# Patient Record
Sex: Female | Born: 1975 | Race: White | Hispanic: No | Marital: Married | Smoking: Never smoker
Health system: Southern US, Community
[De-identification: ages and names within clinical notes are randomized; demographics above are authoritative.]

## PROBLEM LIST (undated history)

## (undated) DIAGNOSIS — Z8619 Personal history of other infectious and parasitic diseases: Secondary | ICD-10-CM

## (undated) DIAGNOSIS — E669 Obesity, unspecified: Secondary | ICD-10-CM

## (undated) DIAGNOSIS — K589 Irritable bowel syndrome without diarrhea: Secondary | ICD-10-CM

## (undated) DIAGNOSIS — K219 Gastro-esophageal reflux disease without esophagitis: Secondary | ICD-10-CM

## (undated) DIAGNOSIS — G479 Sleep disorder, unspecified: Secondary | ICD-10-CM

## (undated) DIAGNOSIS — F329 Major depressive disorder, single episode, unspecified: Secondary | ICD-10-CM

## (undated) DIAGNOSIS — F32A Depression, unspecified: Secondary | ICD-10-CM

## (undated) DIAGNOSIS — G932 Benign intracranial hypertension: Secondary | ICD-10-CM

## (undated) DIAGNOSIS — R5383 Other fatigue: Secondary | ICD-10-CM

## (undated) DIAGNOSIS — M5136 Other intervertebral disc degeneration, lumbar region: Secondary | ICD-10-CM

## (undated) DIAGNOSIS — M51369 Other intervertebral disc degeneration, lumbar region without mention of lumbar back pain or lower extremity pain: Secondary | ICD-10-CM

## (undated) DIAGNOSIS — L659 Nonscarring hair loss, unspecified: Secondary | ICD-10-CM

## (undated) DIAGNOSIS — Z1589 Genetic susceptibility to other disease: Secondary | ICD-10-CM

## (undated) DIAGNOSIS — Z8742 Personal history of other diseases of the female genital tract: Secondary | ICD-10-CM

## (undated) DIAGNOSIS — E7212 Methylenetetrahydrofolate reductase deficiency: Secondary | ICD-10-CM

## (undated) DIAGNOSIS — Z9289 Personal history of other medical treatment: Secondary | ICD-10-CM

## (undated) HISTORY — DX: Other fatigue: R53.83

## (undated) HISTORY — DX: Nonscarring hair loss, unspecified: L65.9

## (undated) HISTORY — DX: Other intervertebral disc degeneration, lumbar region: M51.36

## (undated) HISTORY — DX: Personal history of other medical treatment: Z92.89

## (undated) HISTORY — DX: Genetic susceptibility to other disease: Z15.89

## (undated) HISTORY — DX: Gastro-esophageal reflux disease without esophagitis: K21.9

## (undated) HISTORY — DX: Depression, unspecified: F32.A

## (undated) HISTORY — DX: Irritable bowel syndrome, unspecified: K58.9

## (undated) HISTORY — DX: Other intervertebral disc degeneration, lumbar region without mention of lumbar back pain or lower extremity pain: M51.369

## (undated) HISTORY — DX: Personal history of other infectious and parasitic diseases: Z86.19

## (undated) HISTORY — DX: Major depressive disorder, single episode, unspecified: F32.9

## (undated) HISTORY — PX: TONSILLECTOMY: SUR1361

## (undated) HISTORY — DX: Methylenetetrahydrofolate reductase deficiency: E72.12

## (undated) HISTORY — DX: Obesity, unspecified: E66.9

## (undated) HISTORY — DX: Sleep disorder, unspecified: G47.9

## (undated) HISTORY — PX: NASAL SINUS SURGERY: SHX719

## (undated) HISTORY — DX: Personal history of other diseases of the female genital tract: Z87.42

## (undated) HISTORY — DX: Benign intracranial hypertension: G93.2

---
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
## 2006-02-26 ENCOUNTER — Emergency Department: Payer: Self-pay | Admitting: Emergency Medicine

## 2007-04-05 DIAGNOSIS — Z8742 Personal history of other diseases of the female genital tract: Secondary | ICD-10-CM | POA: Insufficient documentation

## 2007-04-05 HISTORY — DX: Personal history of other diseases of the female genital tract: Z87.42

## 2007-09-03 HISTORY — PX: OTHER SURGICAL HISTORY: SHX169

## 2008-03-21 ENCOUNTER — Encounter: Admission: RE | Admit: 2008-03-21 | Discharge: 2008-03-21 | Payer: Self-pay | Admitting: Family Medicine

## 2009-01-02 ENCOUNTER — Encounter: Payer: Self-pay | Admitting: Cardiology

## 2009-01-02 DIAGNOSIS — Z9289 Personal history of other medical treatment: Secondary | ICD-10-CM

## 2009-01-02 HISTORY — PX: OTHER SURGICAL HISTORY: SHX169

## 2009-01-02 HISTORY — PX: TRANSTHORACIC ECHOCARDIOGRAM: SHX275

## 2009-01-02 HISTORY — DX: Personal history of other medical treatment: Z92.89

## 2009-01-06 ENCOUNTER — Encounter: Payer: Self-pay | Admitting: Cardiology

## 2009-01-07 ENCOUNTER — Encounter: Payer: Self-pay | Admitting: Cardiology

## 2009-01-15 ENCOUNTER — Ambulatory Visit: Payer: Self-pay | Admitting: Cardiology

## 2009-01-15 DIAGNOSIS — J45909 Unspecified asthma, uncomplicated: Secondary | ICD-10-CM | POA: Insufficient documentation

## 2009-01-15 DIAGNOSIS — R079 Chest pain, unspecified: Secondary | ICD-10-CM | POA: Insufficient documentation

## 2009-01-15 LAB — CONVERTED CEMR LAB
Eosinophils Relative: 0 %
Monocytes Relative: 0.8 %
Neutrophils Relative %: 6.9 %
T3 Total by RIA: 21 nmol/L

## 2009-01-27 ENCOUNTER — Ambulatory Visit: Payer: Self-pay

## 2009-01-27 ENCOUNTER — Encounter: Payer: Self-pay | Admitting: Cardiology

## 2009-01-30 ENCOUNTER — Ambulatory Visit: Payer: Self-pay | Admitting: Cardiology

## 2010-10-14 ENCOUNTER — Emergency Department (HOSPITAL_COMMUNITY): Payer: 59

## 2010-10-14 ENCOUNTER — Emergency Department (HOSPITAL_COMMUNITY)
Admission: EM | Admit: 2010-10-14 | Discharge: 2010-10-14 | Disposition: A | Discharge status: Home / Self Care | Payer: 59 | Attending: Emergency Medicine | Admitting: Emergency Medicine

## 2010-10-14 ENCOUNTER — Encounter (HOSPITAL_COMMUNITY): Payer: Self-pay | Admitting: Radiology

## 2010-10-14 DIAGNOSIS — J45909 Unspecified asthma, uncomplicated: Secondary | ICD-10-CM | POA: Insufficient documentation

## 2010-10-14 DIAGNOSIS — R0789 Other chest pain: Secondary | ICD-10-CM | POA: Insufficient documentation

## 2010-10-14 DIAGNOSIS — K219 Gastro-esophageal reflux disease without esophagitis: Secondary | ICD-10-CM | POA: Insufficient documentation

## 2010-10-14 LAB — CBC
HCT: 41.2 % (ref 36.0–46.0)
Hemoglobin: 14.5 g/dL (ref 12.0–15.0)
MCH: 29.5 pg (ref 26.0–34.0)
MCHC: 35.2 g/dL (ref 30.0–36.0)
MCV: 83.7 fL (ref 78.0–100.0)
RBC: 4.92 MIL/uL (ref 3.87–5.11)

## 2010-10-14 LAB — DIFFERENTIAL
Basophils Absolute: 0 10*3/uL (ref 0.0–0.1)
Basophils Relative: 0 % (ref 0–1)
Eosinophils Absolute: 0 10*3/uL (ref 0.0–0.7)
Eosinophils Relative: 0 % (ref 0–5)
Lymphocytes Relative: 30 % (ref 12–46)
Lymphs Abs: 3.3 10*3/uL (ref 0.7–4.0)
Monocytes Absolute: 0.8 10*3/uL (ref 0.1–1.0)
Monocytes Relative: 7 % (ref 3–12)
Neutro Abs: 6.7 10*3/uL (ref 1.7–7.7)
Neutrophils Relative %: 62 % (ref 43–77)

## 2010-10-14 LAB — POCT I-STAT, CHEM 8
BUN: 15 mg/dL (ref 6–23)
Creatinine, Ser: 0.8 mg/dL (ref 0.50–1.10)
Glucose, Bld: 98 mg/dL (ref 70–99)
Hemoglobin: 15 g/dL (ref 12.0–15.0)
TCO2: 23 mmol/L (ref 0–100)

## 2010-10-14 LAB — D-DIMER, QUANTITATIVE: D-Dimer, Quant: 0.57 ug/mL-FEU — ABNORMAL HIGH (ref 0.00–0.48)

## 2010-10-14 MED ORDER — IOHEXOL 350 MG/ML SOLN
100.0000 mL | Freq: Once | INTRAVENOUS | Status: AC | PRN
  Administered 2010-10-14: 100 mL via INTRAVENOUS

## 2010-10-20 ENCOUNTER — Encounter: Payer: Self-pay | Admitting: *Deleted

## 2010-10-20 ENCOUNTER — Encounter: Payer: Self-pay | Admitting: Cardiovascular Disease

## 2010-10-20 ENCOUNTER — Ambulatory Visit (INDEPENDENT_AMBULATORY_CARE_PROVIDER_SITE_OTHER): Payer: 59 | Admitting: Cardiovascular Disease

## 2010-10-20 VITALS — BP 135/86 | HR 82 | Ht 66.0 in | Wt 272.0 lb

## 2010-10-20 DIAGNOSIS — R079 Chest pain, unspecified: Secondary | ICD-10-CM

## 2010-10-20 NOTE — Progress Notes (Signed)
Belinda Martin Date of Birth  June 01, 1975 [facility] / [facility] [address]     [address] [place], [place]  [address] [phone]  Fax  [phone]  History of Present Illness:  Episodes of CP and dyspnea for the past year.  Also has has had neck pain.  Also has tingling in arms.  This past Thursday, she had cp and dyspnea.  Work up at [employer] cone was normal. D dimer was slightly elevated.  CTA of chest was normal.  She is very anxious about the possibility of having coronary artery disease.  Current Outpatient Prescriptions  Medication Sig Dispense Refill  . albuterol (PROVENTIL) (2.5 MG/3ML) 0.083% nebulizer solution Take 2.5 mg by nebulization every 6 (six) hours as needed.        . cetirizine (ZYRTEC) 10 MG tablet Take 10 mg by mouth daily.        . Fluticasone-Salmeterol (ADVAIR DISKUS) 100-50 MCG/DOSE AEPB Inhale 1 puff into the lungs every 12 (twelve) hours.        [name] omeprazole (PRILOSEC) 20 MG capsule Take 20 mg by mouth daily.           Allergies  Allergen Reactions  . Amoxicillin     REACTION: hives    Past Medical History  Diagnosis Date  . Asthma   . GERD (gastroesophageal reflux disease)     Past Surgical History  Procedure Date  . Tonsillectomy and adenoidectomy     History  Smoking status  . Never Smoker   Smokeless tobacco  . Not on file    History  Alcohol Use  . Yes    Family History  Problem Relation Age of Onset  . Arrhythmia Mother   . Arrhythmia Father   . Heart failure Paternal Grandfather     Reviw of Systems:  Reviewed in the HPI.  All other systems are negative.  Physical Exam: BP 135/86  Pulse 82  Ht 5\' 6"  (1.676 m)  Wt 272 lb (123.378 kg)  BMI 43.90 kg/m2.  She is very obese. The patient is alert and oriented x 3.  The mood and affect are normal.   Skin: warm and dry.  Color is normal.    HEENT:   the sclera are nonicteric.  The mucous membranes are moist.  The carotids are 2+ without  bruits.  There is no thyromegaly.  There is no JVD.    Lungs: clear.  The chest wall is non tender.    Heart: regular rate with a normal S1 and S2.  There are no murmurs, gallops, or rubs. The PMI is not displaced.     Abdomin: good bowel sounds.  There is no guarding or rebound.  There is no hepatosplenomegaly or tenderness.  There are no masses.   Extremities:  no clubbing, cyanosis, or edema.  The legs are without rashes.  The distal pulses are intact.   Neuro:  Cranial nerves II - XII are intact.  Motor and sensory functions are intact.    The gait is normal.  ECG: NSR. No ST or T wave change  Assessment / Plan:

## 2010-10-20 NOTE — Patient Instructions (Signed)
You are scheduled for a 2 day myoview, please refer to instruction letter given in office today.  Your physician recommends that you schedule a follow-up appointment in: August 2012 with Dr. Elease Hashimoto:

## 2010-10-20 NOTE — Assessment & Plan Note (Addendum)
She presents with episodes of chest discomfort. She has a history of obesity. She has a family history of coronary artery disease. We'll schedule her for a 2 day stress Myoview study for further evaluation.  Her  d-dimer was only mildly elevated. A followup CT angiogram was negative for pulmonary embolus. I do not think that there is high likelihood of her having a pulmonary embolus.  I have advised her to lose weight. I think this will be the best way for her to avoid future problems.  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  I discussed this disorder with Dr. Marlena Clipper. He stated that this clotting abnormality was previously thought to be responsible for many piercings including premature fetal death, coronary artery disease, hypercoagulability. He did state that at some point we may want to order a homocystine level.  We can treat her with Foltx if her homocystine is found to be elevated. 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  I discussed this back with the patient and  hopefully she is reassured.  I will her again in several months.

## 2010-10-21 ENCOUNTER — Encounter: Payer: Self-pay | Admitting: Cardiology

## 2010-10-26 ENCOUNTER — Encounter: Payer: Self-pay | Admitting: *Deleted

## 2010-10-28 ENCOUNTER — Encounter (HOSPITAL_COMMUNITY): Payer: 59 | Admitting: Radiology

## 2010-11-01 ENCOUNTER — Ambulatory Visit (HOSPITAL_COMMUNITY): Payer: 59 | Attending: Cardiovascular Disease | Admitting: Radiology

## 2010-11-01 VITALS — Ht 66.0 in | Wt 270.0 lb

## 2010-11-01 DIAGNOSIS — R0789 Other chest pain: Secondary | ICD-10-CM

## 2010-11-01 DIAGNOSIS — R079 Chest pain, unspecified: Secondary | ICD-10-CM

## 2010-11-01 DIAGNOSIS — R0602 Shortness of breath: Secondary | ICD-10-CM

## 2010-11-01 MED ORDER — TECHNETIUM TC 99M TETROFOSMIN IV KIT
33.0000 | PACK | Freq: Once | INTRAVENOUS | Status: AC | PRN
  Administered 2010-11-01: 33 via INTRAVENOUS

## 2010-11-01 NOTE — Progress Notes (Addendum)
[facility] SITE 3 NUCLEAR MED [address] [place] [address] [phone]  Cardiology Nuclear Med Study  Nidhi Jacome is a 35 y.o. female [id number] 1975-05-28   Nuclear Med Background Indication for Stress Test:  Evaluation for Ischemia and Post Hospital: 10/14/10 CP, Bilateral Neck and (R) arm tightness tightness and SOB History: 10/10 Echo: EF 65-70% and Asthma Cardiac Risk Factors: Family History - CAD  Symptoms:  Chest Pain, Chest Tightness, Neck tightness, (R) arm tightness  and SOB   Nuclear Pre-Procedure Caffeine/Decaff Intake:  None NPO After: 7:30pm   Lungs:  clear IV 0.9% NS with Angio Cath:  20g  IV Site: R Antecubital  IV Started by:  Milana Na, EMT-P  Chest Size (in):  42 Cup Size: DDD  Height: 5\' 6"  (1.676 m)  Weight:  270 lb (122.471 kg)  BMI:  Body mass index is 43.58 kg/(m^2). Tech Comments:  n/a    Nuclear Med Study 1 or 2 day study: 2 day  Stress Test Type:  Stress  Reading MD: Willa Rough, MD  Order Authorizing Provider:  P.Nahser  Resting Radionuclide: Technetium 10m Tetrofosmin  Resting Radionuclide Dose: 33.0 mCi   Stress Radionuclide:  Technetium 63m Tetrofosmin  Stress Radionuclide Dose: 33.0 mCi           Stress Protocol Rest HR: 80 Stress HR: 166  Rest BP: 112/66 Stress BP: 131/60  Exercise Time (min): 6:30 METS: 7.70   Predicted Max HR: 188 bpm % Max HR: 88.3 bpm Rate Pressure Product: 21308   Dose of Adenosine (mg):  n/a Dose of Lexiscan: n/a mg  Dose of Atropine (mg): n/a Dose of Dobutamine: n/a mcg/kg/min (at max HR)  Stress Test Technologist: Milana Na, EMT-P  Nuclear Technologist:  Harlow Asa, CNMT     Rest Procedure:  Myocardial perfusion imaging was performed at rest 45 minutes following the intravenous administration of Technetium 4m Tetrofosmin. Rest ECG: NSR  Stress Procedure:  The patient exercised for 6:30.  The patient stopped due to fatigue and denied any chest pain.  There  were no significant ST-T wave changes.  (R) Neck and (R) arm tightness 4/10 through recovery but pain free after her pictures. Technetium 43m Tetrofosmin was injected at peak exercise and myocardial perfusion imaging was performed after a brief delay. Stress ECG: No significant change from baseline ECG  QPS Raw Data Images:  There is a breast shadow that accounts for the anterior attenuation. Stress Images:  There is decreased uptake in the anterior wall. Rest Images:  There is decreased uptake in the anterior wall. Subtraction (SDS):  There is a fixed anteriour defect that is most consistent with breast attenuation. Transient Ischemic Dilatation (Normal <1.22):  1.01 Lung/Heart Ratio (Normal <0.45):  0.20  Quantitative Gated Spect Images QGS EDV:  80 ml QGS ESV:  19 ml QGS cine images:  NL LV Function; NL Wall Motion QGS EF: 76%  Impression Exercise Capacity:  Fair exercise capacity. BP Response:  Normal blood pressure response. Clinical Symptoms:  Neck and arm tightness. ECG Impression:  No significant ST segment change suggestive of ischemia. Comparison with Prior Nuclear Study: No images to compare  Overall Impression:  Normal stress nuclear study.   Olga Millers     Please report normal study.  Vesta Mixer, Montez Hageman., MD, [facility]

## 2010-11-04 ENCOUNTER — Encounter: Payer: Self-pay | Admitting: Cardiovascular Disease

## 2010-11-04 ENCOUNTER — Ambulatory Visit (HOSPITAL_COMMUNITY): Payer: 59 | Attending: Cardiovascular Disease | Admitting: Radiology

## 2010-11-04 DIAGNOSIS — R0989 Other specified symptoms and signs involving the circulatory and respiratory systems: Secondary | ICD-10-CM

## 2010-11-04 MED ORDER — TECHNETIUM TC 99M TETROFOSMIN IV KIT
33.0000 | PACK | Freq: Once | INTRAVENOUS | Status: AC | PRN
  Administered 2010-11-04: 33 via INTRAVENOUS

## 2010-11-05 ENCOUNTER — Telehealth: Payer: Self-pay | Admitting: *Deleted

## 2010-11-05 NOTE — Telephone Encounter (Signed)
Patient called with nuc results. msg left Alfonso Ramus RN

## 2010-11-05 NOTE — Progress Notes (Signed)
Nuclear report routed to Dr Nahser. Belinda Martin  

## 2010-11-18 ENCOUNTER — Other Ambulatory Visit: Payer: Self-pay | Admitting: Cardiology

## 2010-11-18 DIAGNOSIS — R209 Unspecified disturbances of skin sensation: Secondary | ICD-10-CM

## 2010-11-22 ENCOUNTER — Encounter (INDEPENDENT_AMBULATORY_CARE_PROVIDER_SITE_OTHER): Payer: 59 | Admitting: Cardiology

## 2010-11-22 DIAGNOSIS — R209 Unspecified disturbances of skin sensation: Secondary | ICD-10-CM

## 2010-11-23 ENCOUNTER — Ambulatory Visit (INDEPENDENT_AMBULATORY_CARE_PROVIDER_SITE_OTHER): Payer: 59 | Admitting: Cardiovascular Disease

## 2010-11-23 ENCOUNTER — Encounter: Payer: Self-pay | Admitting: Family Medicine

## 2010-11-23 ENCOUNTER — Encounter: Payer: Self-pay | Admitting: Cardiovascular Disease

## 2010-11-23 DIAGNOSIS — E785 Hyperlipidemia, unspecified: Secondary | ICD-10-CM | POA: Insufficient documentation

## 2010-11-23 DIAGNOSIS — R079 Chest pain, unspecified: Secondary | ICD-10-CM

## 2010-11-23 NOTE — Assessment & Plan Note (Signed)
Her cholesterol levels have been mildly elevated. She's trying diet and exercise before she starts any cholesterol medicines. She will followup with Dr. Modesto Charon for this.

## 2010-11-23 NOTE — Assessment & Plan Note (Signed)
Skin is no longer having any further episodes of chest pain. Her Lexis can Myoview study was normal. She had no evidence of ischemia. She has normal left ventricular systolic function.  She's exercising on a regular basis. She's not having any discomfort when she exercises. He's also mowing her mother's lawn without any pains.  I do not that she has any significant coronary disease. He informed that she does have some high cholesterol levels which is being followed by Dr. Leodis Sias.

## 2010-11-23 NOTE — Progress Notes (Addendum)
Belinda Martin Date of Birth  1976/03/28 [facility] / [facility] [address]     [address] [place], [place]  [address] [phone]  Fax  [phone]  History of Present Illness:  Belinda Martin is a 35 year old female who I saw a month ago. She has a history of chest pain. A lengthy scan at [facility] study was normal. He is now exercising on an intermittent basis. She's been mowing  her mother's lawn on a regular basis. She's lost 2 pounds since her last office visit. She thinks that her exercise regimen may be getting a little bit easier for her to do.   Current Outpatient Prescriptions on File Prior to Visit  Medication Sig Dispense Refill  . albuterol (PROVENTIL) (2.5 MG/3ML) 0.083% nebulizer solution Take 2.5 mg by nebulization every 6 (six) hours as needed.        . cetirizine (ZYRTEC) 10 MG tablet Take 10 mg by mouth daily.        [name] dextromethorphan (DELSYM) 30 MG/5ML liquid Take 60 mg by mouth as needed.        . dicyclomine (BENTYL) 20 MG tablet Take 20 mg by mouth 4 (four) times daily as needed.        . fluticasone (VERAMYST) 27.5 MCG/SPRAY nasal spray Place into the nose.        [name] Fluticasone-Salmeterol (ADVAIR DISKUS) 100-50 MCG/DOSE AEPB Inhale 1 puff into the lungs every 12 (twelve) hours.        . meloxicam (MOBIC) 15 MG tablet Take 15 mg by mouth daily.        [name] omeprazole (PRILOSEC) 20 MG capsule Take 20 mg by mouth daily.          Allergies  Allergen Reactions  . Amoxicillin     REACTION: hives    Past Medical History  Diagnosis Date  . GERD (gastroesophageal reflux disease)   . Fatigue   . Chest pain   . Hair loss     excessive  . Obesity   . Asthma     Allergic  . History of echocardiogram 10/10    EF 65-70%, normal valves    Past Surgical History  Procedure Date  . Tonsillectomy and adenoidectomy   . Nasal sinus surgery   . Echo test 10/10    EF 65-70%, normal valves    History  Smoking status  . Never Smoker   Smokeless  tobacco  . Not on file    History  Alcohol Use No    Family History  Problem Relation Age of Onset  . Arrhythmia Mother   . Heart murmur Mother   . Arrhythmia Father   . Heart failure Paternal Grandfather     CHF  . Heart murmur Paternal Uncle   . Coronary artery disease Neg Hx     premature CAD    Reviw of Systems:  Reviewed in the HPI.  All other systems are negative.  Physical Exam: BP 135/82  Pulse 83  Ht 5\' 7"  (1.702 m)  Wt 270 lb (122.471 kg)  BMI 42.29 kg/m2 The patient is alert and oriented x 3.  The mood and affect are normal.   Skin: warm and dry.  Color is normal.    HEENT:   the sclera are nonicteric.  The mucous membranes are moist.  The carotids are 2+ without bruits.  There is no thyromegaly.  There is no JVD.    Lungs: clear.  The chest wall is non tender.  Heart: regular rate with a normal S1 and S2.  There are no murmurs, gallops, or rubs. The PMI is not displaced.     Abdomen: good bowel sounds.  There is no guarding or rebound.  There is no hepatosplenomegaly or tenderness.  There are no masses.   Extremities:  no clubbing, cyanosis, or edema.  The legs are without rashes.  The distal pulses are intact.   Neuro:  Cranial nerves II - XII are intact.  Motor and sensory functions are intact.    The gait is normal.  ECG:   Normal sinus rhythm. Chest normal EKG.  Assessment / Plan:

## 2010-11-24 ENCOUNTER — Encounter: Payer: Self-pay | Admitting: Cardiovascular Disease

## 2010-11-25 ENCOUNTER — Telehealth: Payer: Self-pay | Admitting: *Deleted

## 2010-11-25 NOTE — Telephone Encounter (Signed)
Message copied by Antony Odea on Thu Nov 25, 2010  3:13 PM ------      Message from: [place], [place]      Created: Wed Nov 24, 2010  6:17 PM       Normal carotid artery duplex

## 2010-11-25 NOTE — Telephone Encounter (Signed)
Patient called with normal carotid results. Alfonso Ramus RN

## 2011-12-08 ENCOUNTER — Encounter: Payer: Self-pay | Admitting: Obstetrics and Gynecology

## 2012-07-20 ENCOUNTER — Ambulatory Visit: Payer: Self-pay | Admitting: Family Medicine

## 2012-07-24 ENCOUNTER — Other Ambulatory Visit: Payer: Self-pay | Admitting: Orthopedic Surgery

## 2012-07-24 DIAGNOSIS — M5136 Other intervertebral disc degeneration, lumbar region: Secondary | ICD-10-CM

## 2012-07-28 ENCOUNTER — Ambulatory Visit
Admission: RE | Admit: 2012-07-28 | Discharge: 2012-07-28 | Disposition: A | Discharge status: Home / Self Care | Payer: 59 | Source: Ambulatory Visit | Attending: Orthopedic Surgery | Admitting: Orthopedic Surgery

## 2012-07-28 DIAGNOSIS — M5136 Other intervertebral disc degeneration, lumbar region: Secondary | ICD-10-CM

## 2013-02-01 ENCOUNTER — Ambulatory Visit: Payer: Self-pay | Admitting: Family Medicine

## 2013-02-08 ENCOUNTER — Ambulatory Visit (INDEPENDENT_AMBULATORY_CARE_PROVIDER_SITE_OTHER): Payer: 59 | Admitting: Family Medicine

## 2013-02-08 ENCOUNTER — Ambulatory Visit: Payer: Self-pay | Admitting: Family Medicine

## 2013-02-08 ENCOUNTER — Encounter: Payer: Self-pay | Admitting: Family Medicine

## 2013-02-08 VITALS — BP 120/72 | HR 98 | Temp 98.3°F | Resp 18 | Ht 66.5 in | Wt 277.0 lb

## 2013-02-08 DIAGNOSIS — R5381 Other malaise: Secondary | ICD-10-CM

## 2013-02-08 DIAGNOSIS — R5383 Other fatigue: Secondary | ICD-10-CM

## 2013-02-08 DIAGNOSIS — G2581 Restless legs syndrome: Secondary | ICD-10-CM

## 2013-02-08 MED ORDER — ROPINIROLE HCL 0.5 MG PO TABS: 1.5000 mg | ORAL_TABLET | Freq: Three times a day (TID) | ORAL | Status: DC

## 2013-02-08 NOTE — Progress Notes (Signed)
  Subjective:    Patient ID: Belinda Martin, female    DOB: [birth year], 37 y.o.   MRN: [id number]  HPI Patient presents complaining of severe malaise and fatigue. She states she can sleep 12 hours and still not feel rested. She is always tired. She needs and thoughts of driving to work. She had a sleep study which showed an apnea hypotony index less than 5. Although it did show frequent periodic limb movements of sleep. Her husband frequently says that she kicks him during the middle of the night. She also reports increased thirst and urinary frequency. She also reports memory problems and poor concentration although she attributes this to her poor sleep. She denies any depression or anhedonia. She denies any weight loss consistent with diabetes. Past Medical History  Diagnosis Date  . GERD (gastroesophageal reflux disease)   . Fatigue   . Chest pain   . Hair loss     excessive  . Obesity   . Asthma     Allergic  . History of echocardiogram 10/10    EF 65-70%, normal valves   No current outpatient prescriptions on file prior to visit.   No current facility-administered medications on file prior to visit.   Allergies  Allergen Reactions  . Amoxicillin     REACTION: hives   History   Social History  . Marital Status: Married    Spouse Name: N/A    Number of Children: N/A  . Years of Education: N/A   Occupational History  . Mental health assessments     [employer]   Social History Main Topics  . Smoking status: Never Smoker   . Smokeless tobacco: Not on file  . Alcohol Use: No  . Drug Use: No  . Sexual Activity: Not on file   Other Topics Concern  . 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       Review of Systems  All other systems reviewed and are negative.       Objective:   Physical Exam  Vitals reviewed. Neck: Neck supple. No thyromegaly present.   Cardiovascular: Normal rate and regular rhythm.   Pulmonary/Chest: Effort normal and breath sounds normal.  Psychiatric: She has a normal mood and affect. Her behavior is normal. Judgment and thought content normal.          Assessment & Plan:  1. Other malaise and fatigue I have given the patient an order for her to go to [facility].  I have requested that she check a CBC, CMP, TSH, B12 level to rule out metabolic causes of fatigue and rule out diabetes. I do not she has depression. 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  2. RLS (restless legs syndrome) Pending the results of laboratory studies, I recommended she start Requip 0.5 mg tablets. She is to take one tablet at night for 2 weeks then increase to 2 tablets at night for 2 weeks then increase to 3 tablets at night if necessary to control limb movements. It reassess her fatigue after she's been on the medicine for one month.

## 2013-02-24 ENCOUNTER — Encounter: Payer: Self-pay | Admitting: Family Medicine

## 2013-04-10 ENCOUNTER — Encounter: Payer: Self-pay | Admitting: Family Medicine

## 2013-07-05 ENCOUNTER — Ambulatory Visit (INDEPENDENT_AMBULATORY_CARE_PROVIDER_SITE_OTHER): Payer: 59 | Admitting: Family Medicine

## 2013-07-05 DIAGNOSIS — Z23 Encounter for immunization: Secondary | ICD-10-CM

## 2013-07-17 ENCOUNTER — Ambulatory Visit (INDEPENDENT_AMBULATORY_CARE_PROVIDER_SITE_OTHER): Payer: 59 | Admitting: Family Medicine

## 2013-07-17 ENCOUNTER — Encounter: Payer: Self-pay | Admitting: Family Medicine

## 2013-07-17 VITALS — BP 122/68 | HR 76 | Temp 98.2°F | Resp 14 | Ht 65.0 in | Wt 269.0 lb

## 2013-07-17 DIAGNOSIS — G932 Benign intracranial hypertension: Secondary | ICD-10-CM

## 2013-07-17 DIAGNOSIS — L0291 Cutaneous abscess, unspecified: Secondary | ICD-10-CM

## 2013-07-17 DIAGNOSIS — J3089 Other allergic rhinitis: Secondary | ICD-10-CM

## 2013-07-17 DIAGNOSIS — L0390 Cellulitis, unspecified: Secondary | ICD-10-CM | POA: Insufficient documentation

## 2013-07-17 MED ORDER — CEFDINIR 300 MG PO CAPS: 300.0000 mg | ORAL_CAPSULE | Freq: Two times a day (BID) | ORAL | Status: DC

## 2013-07-17 NOTE — Assessment & Plan Note (Signed)
No signs and symptoms of Psuedotumor Cerebri, obtain opthalmology records and see if pt had any papilledema, consider MRI based on results

## 2013-07-17 NOTE — Assessment & Plan Note (Signed)
Her pics look more like a cellultis, she has some tenderness, over the masotid, region, but I do not think this is a true mastoiditis No erythema actually seen on exam currently, no OM, or OE Will start her on omnicef to cover, given red flags

## 2013-07-17 NOTE — Progress Notes (Signed)
Patient ID: Belinda Martin, female   DOB: [birth year], 38 y.o.   MRN: [id number]   Subjective:    Patient ID: Belinda Martin, female    DOB: 06/17/1975, 38 y.o.   MRN: [id number]  Patient presents for Infection and new dx from eye doctor  Pt here with 24 hours of redness warmth, behing right ear, also with tenderness near mastoid region, no fever, no ear drainage, no ear pain, no sore throat or sinus symptoms. She took a picture of her ear last night which had erythema extending from pinna of right ear to posterior auricular region  Also states her eye doctor did an exam on her and told her she has pseudotumor and that she needed to loose weight. No headaches, no vision changes.    Review Of Systems: - per above  GEN- denies fatigue, fever, weight loss,weakness, recent illness HEENT- denies eye drainage, change in vision, nasal discharge, CVS- denies chest pain, palpitations RESP- denies SOB, cough, wheeze Neuro- denies headache, dizziness, syncope, seizure activity       Objective:    BP 122/68  Pulse 76  Temp(Src) 98.2 F (36.8 C) (Oral)  Resp 14  Ht 5\' 5"  (1.651 m)  Wt 269 lb (122.018 kg)  BMI 44.76 kg/m2  LMP 06/19/2013 GEN- NAD, alert and oriented x3 HEENT- PERRL, EOMI, non injected sclera, pink conjunctiva, MMM, oropharynx clear, TM Clear no effusion, canals clear bilat, no maxillary sinus tenderness Neck- Supple, shotty LAD submandibular Skin- minimal erythema post auricular, mild warmth noted, no swelling of earlobe or mastoid region, mild TTP over mastoid region and tracking posterior auricaular region Neuro- CNII-XII in tact, no deficits       Assessment & Plan:      Problem List Items Addressed This Visit   Pseudotumor cerebri     No signs and symptoms of Psuedotumor Cerebri, obtain opthalmology records and see if pt had any papilledema, consider MRI based on results    Cellulitis - Primary     Her pics look more like a cellultis, she has some tenderness, over the  masotid, region, but I do not think this is a true mastoiditis No erythema actually seen on exam currently, no OM, or OE Will start her on omnicef to cover, given red flags       Note: This dictation was prepared with Dragon dictation along with smaller phrase technology. Any transcriptional errors that result from this process are unintentional.

## 2013-07-17 NOTE — Patient Instructions (Addendum)
Release on [place], [place]  Start antibiotics to cover for infection Continue Ibuprofen 600mg  every 6 hours as needed with food Call if it looks worse or go to nearest ER Referral to allergy specialist in [place] F/U as needed

## 2013-07-18 ENCOUNTER — Ambulatory Visit: Payer: 59 | Admitting: Physician Assistant

## 2013-07-19 ENCOUNTER — Telehealth: Payer: Self-pay | Admitting: *Deleted

## 2013-07-19 NOTE — Telephone Encounter (Signed)
Message copied by Sheral Flow on Fri Jul 19, 2013  8:54 AM ------      Message from: Lenore Manner      Created: Fri Jul 19, 2013  8:38 AM      Regarding: Eye issues      Contact: [phone]       PT is calling because yesterday she went to work out and after she got back to house. Her left eye wasn't right it was like she could see an edge of a prism for brief moments, and had a headache in front of her head and sides.  ------

## 2013-07-19 NOTE — Telephone Encounter (Signed)
Call placed to patient and patient made aware.  

## 2013-07-19 NOTE — Telephone Encounter (Signed)
Call placed to patient to obtain more information.  LMTRC.  

## 2013-07-19 NOTE — Telephone Encounter (Signed)
Message copied by Sheral Flow on Fri Jul 19, 2013  8:53 AM ------      Message from: Lenore Manner      Created: Fri Jul 19, 2013  8:38 AM      Regarding: Eye issues      Contact: [phone]       PT is calling because yesterday she went to work out and after she got back to house. Her left eye wasn't right it was like she could see an edge of a prism for brief moments, and had a headache in front of her head and sides.  ------

## 2013-07-19 NOTE — Telephone Encounter (Signed)
Noted She can continue the current medications Tyelnol or ibuprofen for headache for now

## 2013-07-19 NOTE — Telephone Encounter (Signed)
Patient returned call.   States that she saw prism in L eye and then was followed by headache in temple.   States that today she has tenderness noted to ear, and her headache is not completely resolved. She is continuing the ABT for cellulitis to L ear and is taking Ibuprofen for pain.   MD to be made aware.

## 2013-07-21 ENCOUNTER — Telehealth: Payer: Self-pay | Admitting: Family Medicine

## 2013-07-21 DIAGNOSIS — G932 Benign intracranial hypertension: Secondary | ICD-10-CM

## 2013-07-21 DIAGNOSIS — R519 Headache, unspecified: Secondary | ICD-10-CM

## 2013-07-21 DIAGNOSIS — H4710 Unspecified papilledema: Secondary | ICD-10-CM

## 2013-07-21 DIAGNOSIS — R51 Headache: Principal | ICD-10-CM

## 2013-07-21 DIAGNOSIS — J3089 Other allergic rhinitis: Secondary | ICD-10-CM | POA: Insufficient documentation

## 2013-07-21 NOTE — Telephone Encounter (Signed)
Please call pt, I saw eye doctor appt, there was a question of her optic nerve being swollen- papilledema, this is were the psudotumor cerebri or Intracranial HTN came in, but it was not definite. Since she has had some headaches recently we will go ahead and get the MRI, due to the information from the eye doctor.

## 2013-07-21 NOTE — Addendum Note (Signed)
Addended by: Vic Blackbird F on: 07/21/2013 09:34 AM   Modules accepted: Orders

## 2013-07-22 NOTE — Telephone Encounter (Signed)
LMTRC

## 2013-07-23 NOTE — Telephone Encounter (Signed)
Call placed to patient and patient made aware per VM.  

## 2013-07-23 NOTE — Telephone Encounter (Signed)
[facility].    MRI scheduled for 07/29/2013.

## 2013-07-26 ENCOUNTER — Ambulatory Visit
Admission: RE | Admit: 2013-07-26 | Discharge: 2013-07-26 | Disposition: A | Discharge status: Home / Self Care | Payer: No Typology Code available for payment source | Source: Ambulatory Visit | Attending: Family Medicine | Admitting: Family Medicine

## 2013-07-26 DIAGNOSIS — R519 Headache, unspecified: Secondary | ICD-10-CM

## 2013-07-26 DIAGNOSIS — G932 Benign intracranial hypertension: Secondary | ICD-10-CM

## 2013-07-26 DIAGNOSIS — R51 Headache: Principal | ICD-10-CM

## 2013-07-26 DIAGNOSIS — H4710 Unspecified papilledema: Secondary | ICD-10-CM

## 2013-07-29 ENCOUNTER — Other Ambulatory Visit: Payer: 59

## 2013-08-23 ENCOUNTER — Telehealth: Payer: Self-pay | Admitting: Family Medicine

## 2013-08-23 MED ORDER — ROPINIROLE HCL 0.5 MG PO TABS: 1.5000 mg | ORAL_TABLET | Freq: Three times a day (TID) | ORAL | Status: DC

## 2013-08-23 NOTE — Telephone Encounter (Signed)
Rx Refilled and pt aware no need for appt at this time

## 2013-08-23 NOTE — Telephone Encounter (Signed)
Message copied by Alyson Locket on Fri Aug 23, 2013  9:44 AM ------      Message from: Devoria Glassing      Created: Fri Aug 23, 2013  9:32 AM       [facility] [address] [place]       rOPINIRole (REQUIP) 0.5 MG tablet      Patient is asking if she needs to come in for an office visit she said she has no refills ------

## 2013-12-06 ENCOUNTER — Telehealth: Payer: Self-pay | Admitting: Family Medicine

## 2013-12-06 ENCOUNTER — Encounter: Payer: Self-pay | Admitting: Family Medicine

## 2013-12-06 ENCOUNTER — Ambulatory Visit (INDEPENDENT_AMBULATORY_CARE_PROVIDER_SITE_OTHER): Payer: 59 | Admitting: Family Medicine

## 2013-12-06 VITALS — BP 120/80 | HR 98 | Temp 98.1°F | Resp 18 | Wt 262.0 lb

## 2013-12-06 DIAGNOSIS — E669 Obesity, unspecified: Secondary | ICD-10-CM

## 2013-12-06 DIAGNOSIS — R5381 Other malaise: Secondary | ICD-10-CM

## 2013-12-06 DIAGNOSIS — R5383 Other fatigue: Secondary | ICD-10-CM

## 2013-12-06 NOTE — Telephone Encounter (Signed)
Patient just wants to thank you for your patience and kindness today  [phone]

## 2013-12-06 NOTE — Progress Notes (Signed)
Subjective:    Patient ID: Belinda Martin, female    DOB: 01/28/1976, 38 y.o.   MRN: [id number]  HPI  Patient has been battling obesity. She's been able to lose 7 pounds recently to therapeutic lifestyle changes. She is attending ZUMBA classes 4-5 times per week and exercising for 1 hour in each class. She is also made dietary changes. She is interested in speaking with the nutritionist. She is also to discuss other options for weight loss including medication. She is also concerned about fatigue. Over last 2 weeks she is noticed progressive fatigue. She is frequently falling asleep easily during the day. Recently she was started on Clarinex in addition to her Requip, Singulair, and Qnasal.  She's been on Requip now for quite some time without difficulty as far as fatigue and excessive daytime somnolence. However after the recent addition of the Clarinex she is very sleepy during the day and fatigued. Past Medical History  Diagnosis Date  . GERD (gastroesophageal reflux disease)   . Fatigue   . Chest pain   . Hair loss     excessive  . Obesity   . Asthma     Allergic  . History of echocardiogram 10/10    EF 65-70%, normal valves   Past Surgical History  Procedure Laterality Date  . Tonsillectomy and adenoidectomy    . Nasal sinus surgery    . Echo test  10/10    EF 65-70%, normal valves   Current Outpatient Prescriptions on File Prior to Visit  Medication Sig Dispense Refill  . rOPINIRole (REQUIP) 0.5 MG tablet Take 3 tablets (1.5 mg total) by mouth 3 (three) times daily.  90 tablet  3  . cefdinir (OMNICEF) 300 MG capsule Take 1 capsule (300 mg total) by mouth 2 (two) times daily.  30 capsule  0  . cetirizine (ZYRTEC) 10 MG tablet Take 10 mg by mouth daily.       No current facility-administered medications on file prior to visit.   Allergies  Allergen Reactions  . Amoxicillin     REACTION: hives   History   Social History  . Marital Status: Married    Spouse Name: N/A    Number of Children: N/A  . Years of Education: N/A   Occupational History  . Mental health assessments     [employer] [facility]   Social History Main Topics  . Smoking status: Never Smoker   . Smokeless tobacco: Never Used  . Alcohol Use: No  . Drug Use: No  . Sexual Activity: Yes   Other Topics Concern  . 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      Review of Systems  All other systems reviewed and are negative.      Objective:   Physical Exam  Vitals reviewed. Constitutional: She appears well-developed and well-nourished.  Neck: Neck supple. No thyromegaly present.  Cardiovascular: Normal rate, regular rhythm and normal heart sounds.   Pulmonary/Chest: Effort normal and breath sounds normal. No respiratory distress. She has no wheezes. She has no rales.  Abdominal: Soft. Bowel sounds are normal.          Assessment & Plan:  Obesity  Other malaise and fatigue   Gladly schedule the patient to see a nutritionist. I recommended her limiting her dietary caloric intake to 1200-1500 calories per day. I recommended her increasing her aerobic exercise  to 1 hour a day 5 days a week. We also discussed medications for weight loss. At the present time the patient is not interested in medication but would like to speak with the nutritionist. I do believe the fatigue and excessive daytime sleepiness is likely due to medication side effects and polypharmacy. I recommended the patient try temporarily discontinuing the Clarinex and see if sedation improves.

## 2013-12-27 ENCOUNTER — Telehealth: Payer: Self-pay | Admitting: Family Medicine

## 2013-12-27 MED ORDER — ROPINIROLE HCL 0.5 MG PO TABS: 1.5000 mg | ORAL_TABLET | Freq: Three times a day (TID) | ORAL | Status: DC

## 2013-12-27 NOTE — Telephone Encounter (Signed)
Medication refilled per protocol. 

## 2013-12-30 ENCOUNTER — Other Ambulatory Visit: Payer: Self-pay | Admitting: Family Medicine

## 2013-12-30 MED ORDER — ROPINIROLE HCL 0.5 MG PO TABS: 1.5000 mg | ORAL_TABLET | Freq: Three times a day (TID) | ORAL | Status: DC

## 2013-12-30 NOTE — Telephone Encounter (Signed)
Med sent to pharm 

## 2014-02-17 ENCOUNTER — Encounter: Payer: Self-pay | Admitting: Dietician

## 2014-02-17 ENCOUNTER — Encounter: Payer: 59 | Attending: Family Medicine | Admitting: Dietician

## 2014-02-17 VITALS — Ht 67.0 in | Wt 265.8 lb

## 2014-02-17 DIAGNOSIS — E669 Obesity, unspecified: Secondary | ICD-10-CM | POA: Diagnosis present

## 2014-02-17 DIAGNOSIS — Z6841 Body Mass Index (BMI) 40.0 and over, adult: Secondary | ICD-10-CM | POA: Diagnosis not present

## 2014-02-17 DIAGNOSIS — Z713 Dietary counseling and surveillance: Secondary | ICD-10-CM | POA: Insufficient documentation

## 2014-02-17 NOTE — Patient Instructions (Addendum)
Aim to fill half of your plate with vegetables at lunch and dinner.  Have protein the size of the palm of your hand (3-4 oz). Limit starch starch to a quarter of your plate.  Have protein with carbs for snacks. For protein bar, look for around 10 g of protein and carbs around 15 g.  Continue exercising 4 x week with cardio and weights when you can.  Think about using smaller plates (bowls, glasses). Try to take 20 minutes to eat meals without distraction. Aim to chew 20 x per bite.

## 2014-02-17 NOTE — Progress Notes (Signed)
Medical Nutrition Therapy:  Appt start time: 1100 end time:  1200.   Assessment:  Primary concerns today: Feleshia is here today since she has been working on losing weight for years (since high school). 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 Since the tumor was found in March has lost about 20 lbs. Has been drinking more water and watching what she is eating. Also using a Health Coach to help in increase vegetable and fruit intake. Feels like is getting more full using the whole grain bread. Portion sizes are smaller than they used to be.   Was doing Zumba, walking, and lifting weights 4 x week but got a stress fracture in early September so she hasn't been cleared to exercise. Will be able to start again in early December.  Works for the [employer] and lives with husband. She does most of the food shopping and they share the meal preparation. Sometimes buys food just for herself if he does not want to eat the same foods.  Does not skip meals. Eats out about 1 x week at most. Takes Requip since her limbs move while she is sleeping so sometimes she does not get restful sleep. Usually sleeping about 6.5-7 hours per night.   Preferred Learning Style:   No preference indicated   Learning Readiness:   Ready  MEDICATIONS:  Set list    DIETARY INTAKE:  Usual eating pattern includes 3 meals and 2 snacks per day.  Avoided foods include coffee, spinach/turnip greens , olives   24-hr recall:  B ( AM): canadian bacon on [place] Muffin with cheese or [place] Yogurt Nutrigrain bar with Diet Mountain Dew  Snk ( AM): grapes/fruit or yogurt Nutrigrain bar or oat and raisin protein bar  L ( PM): sandwich ([place] or ham with cheese) with fruit and some Sun Chips or 60 calorie jello or peaches or raw broccoli and cauliflower Snk ( PM): none D ( PM):1/2 quesadilla, hot dog (not normal), Special K, frozen chicken and  vegetable dinner, sandwich Snk ( PM): cheese stick, 1/2 peanut butter sandwich with whole grain bed  Beverages: water or Diet Mountain Dew  Usual physical activity: was doing 4 x week but could not exercise since the beginning of September since she had a stress fracture  Estimated energy needs: 1800 calories 200 g carbohydrates 135 g protein 50 g fat  Progress Towards Goal(s):  In progress.   Nutritional Diagnosis:  [place]ly[place]-3.3 Overweight/obesity As related to hx of large portion sizes and energy dense food choices.  As evidenced by BMI of 41.6.      Intervention:  Nutrition counseling provided. Plan: Aim to fill half of your plate with vegetables at lunch and dinner.  Have protein the size of the palm of your hand (3-4 oz). Limit starch starch to a quarter of your plate.  Have protein with carbs for snacks. For protein bar, look for around 10 g of protein and carbs around 15 g.  Continue exercising 4 x week with cardio and weights when you can.  Think about using smaller plates (bowls, glasses). Try to take 20 minutes to eat meals without distraction. Aim to chew 20 x per bite.   Teaching Method Utilized:  Visual Auditory Hands on  Handouts given during visit include:  MyPlate Handout  15 g CHO Snacks  Bariatric Fast Food Guide  Barriers to learning/adherence to lifestyle change: none  Demonstrated degree of understanding via:  Teach Back   Monitoring/Evaluation:  Dietary intake, exercise, and body weight prn.

## 2014-03-12 ENCOUNTER — Ambulatory Visit: Payer: Self-pay

## 2014-07-22 NOTE — Consult Note (Signed)
Referral Information:   Reason for Referral preconception counseling    Referring Physician [facility]    Prenatal Hx 39 yo G0  - Off OCPs x 18 mos - they have not been actively trying - regular cycles  -elevated BMI 43- pt notes most of her weight gain while on DepoProvera - she has tried Weight Watchers in the past - she does snore never had a sleep study -asthma not currently on meds  -Father had DVT - diagnosed with homozygous MTHFR - pt not tested did well on OCPs x 15 years on folic acid and ASA - episode of chest pain cardic w/u negative started on asa and fish oil  - episode of right sided numbness at age 36 - no specific dx thought to be an atypical migraine ?    Past Obstetrical Hx nulligravida   Home Medications: Medication Instructions Status  aspirin 81 mg oral tablet 1 tab(s) orally once a day Active  omeprazole 20 mg oral delayed release capsule 1 cap(s) orally once a day Active  Zyrtec 10 mg oral tablet 1 tab(s) orally once a day, As Needed Active  Vitamin B-12 1000 mcg oral tablet 1 tab(s) orally once a day Active  folic acid 0.4 mg oral tablet 3 tab(s) orally once a day Active  Fish Oil 1000 mg oral capsule 2 cap(s) orally once a day Active  multivitamin 1 tab(s) orally once a day Active  albuterol 2 puff(s) orally 1 to 2 times a day, As Needed- for Wheezing  Active  Advair Diskus 250 mcg-50 mcg inhalation powder 1 puff(s) inhaled once a day, As Needed- for Wheezing  Active   Allergies:   Amoxicillin: Hives  Vital Signs/Notes:  Nursing Vital Signs: **Vital Signs.:   05-Sep-13 14:07   Vital Signs Type Routine   Temperature Temperature (F) 97.9   Celsius 36.6   Temperature Source oral   Pulse Pulse 79   Pulse source if not from Vital Sign Device dinamap   Respirations Respirations 16   Systolic BP Systolic BP 528   Diastolic BP (mmHg) Diastolic BP (mmHg) 70   Mean BP 89   BP Source  if not from Vital Sign Device dinamap   Perinatal Consult:   Past  Medical History cont'd asthma- off meds  tremor- fine benign - pt notes mother is disabled from hers with difficulty writing and eating she is follwed at [facility] her neurologist has offered to see ms Ricard Dillon if desired elevated BMI  h/o chest pain neg w/u h/o right sided weakness neg w/u    FHx thromboses    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 No conception off OCPS - not trying recently  2 elevated BMI- reviewed pregnancy risks including association with anomalies and SAb I reviewed [facility] elevated BMI protocol , snores no sleep study , needs early glucola if conceives  3 family h/o clot - father MTFHR homozygote- aside from folic acid no need for further treatment - pt tp retriev his testing  4 asthma off meds  5 benign tremor  6 episode of numbness - no recurrence over many years  7  episode of chest pain - neg w/u    Recommendations 1. Pursue  conception if desired we discussed pursuign before getting much older  2. work on weight loss - suggested [employer] Watchers- recommended they pursue as a couple since he cooks for the family, consider sleep study for incresed BMI and h/o snoring 3 . continue folic acid and ASA 81 mg- continue in pregnancy- SCDs in labor  4 . Advised asthma can be occisaionally  worsened by pregnancy - current meds may be continued, I encouraged annual flu vaccine  5 REviewed background pregnancy risks of SAB , anomalies, preeclampsia , diabetes , cesarean   Plan:   Genetic Counseling yes, today     Total Time Spent with Patient 30 minutes    >50% of visit spent in couseling/coordination of care yes    Office  Use Only 99242  Level 2 (52mn) NEW office consult exp prob focused   Coding Description: MATERNAL CONDITIONS/HISTORY INDICATION(S).   Obesity - BMI greater than equal to 30.   Primary hypercoagulable state.  Electronic Signatures: LSharyn Creamer(MD)  (Signed 05-Sep-13 16:02)  Authored: Referral, Home Medications, Allergies, Vital Signs/Notes, Consult, Exam, Lab/Radiology Notes, Impression, Plan, Billing, Coding Description   Last Updated: 05-Sep-13 16:02 by LSharyn Creamer(MD)

## 2014-08-19 ENCOUNTER — Ambulatory Visit
Admission: RE | Admit: 2014-08-19 | Discharge: 2014-08-19 | Disposition: A | Discharge status: Home / Self Care | Payer: 59 | Source: Ambulatory Visit | Attending: Family Medicine | Admitting: Family Medicine

## 2014-08-19 ENCOUNTER — Encounter: Payer: Self-pay | Admitting: Family Medicine

## 2014-08-19 ENCOUNTER — Ambulatory Visit (INDEPENDENT_AMBULATORY_CARE_PROVIDER_SITE_OTHER): Payer: 59 | Admitting: Family Medicine

## 2014-08-19 VITALS — BP 126/68 | HR 98 | Temp 98.5°F | Resp 16 | Ht 66.5 in | Wt 255.0 lb

## 2014-08-19 DIAGNOSIS — R091 Pleurisy: Secondary | ICD-10-CM | POA: Diagnosis not present

## 2014-08-19 MED ORDER — AZITHROMYCIN 250 MG PO TABS: ORAL_TABLET | ORAL | Status: DC

## 2014-08-19 NOTE — Progress Notes (Signed)
Subjective:    Patient ID: Belinda Martin, female    DOB: [birth year], 39 y.o.   MRN: [id number]  HPI Patient symptoms began Saturday. She developed pain in her middle left side of her back. She also developed pain underneath her left ribs in the front. The pain is now exacerbated by taking a deep breath in. She is also developed a cough and shortness of breath. She's developed significant pleurisy. She denies any fevers or chills. The cough is nonproductive but she has noticed some wheezing with the cough. She denies any plane flights or recent surgery. She has no risk factors for DVT. She is not on birth control pills. There is no pericardial friction rub today on exam. She denies any chest pain or angina-like symptoms. She denies any sick contacts although she did visit a friend at [facility] recently. Past Medical History  Diagnosis Date  . GERD (gastroesophageal reflux disease)   . Fatigue   . Chest pain   . Hair loss     excessive  . Obesity   . Asthma     Allergic  . History of echocardiogram 10/10    EF 65-70%, normal valves  . Irritable bowel syndrome    Past Surgical History  Procedure Laterality Date  . Tonsillectomy and adenoidectomy    . Nasal sinus surgery    . Echo test  10/10    EF 65-70%, normal valves   Current Outpatient Prescriptions on File Prior to Visit  Medication Sig Dispense Refill  . montelukast (SINGULAIR) 10 MG tablet Take 10 mg by mouth at bedtime.    [name] omeprazole (PRILOSEC) 10 MG capsule Take 10 mg by mouth 4 (four) times daily.     No current facility-administered medications on file prior to visit.   Allergies  Allergen Reactions  . Amoxicillin     REACTION: hives   History   Social History  . Marital Status: Single    Spouse Name: N/A  . Number of Children: N/A  . Years of Education: N/A   Occupational History  . Mental health assessments     [facility] [employer] [facility]   Social History Main Topics  . Smoking status: Never Smoker     . Smokeless tobacco: Never Used  . Alcohol Use: No  . Drug Use: No  . Sexual Activity: Yes   Other Topics Concern  . 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       Review of Systems  All other systems reviewed and are negative.      Objective:   Physical Exam  Constitutional: She appears well-developed and well-nourished.  Cardiovascular: Normal rate, regular rhythm and normal heart sounds.  Exam reveals no gallop and no friction rub.   No murmur heard. Pulmonary/Chest: Effort normal and breath sounds normal. No respiratory distress. She has no wheezes. She has no rales. She exhibits no tenderness.  Abdominal: Soft. Bowel sounds are normal. She exhibits no distension. There is no tenderness. There is no rebound and no guarding.  Vitals reviewed. ppulse oximetry is 98% on room air and with exertion        Assessment & Plan:  Pleurisy - Plan: azithromycin (ZITHROMAX) 250 MG tablet, DG Chest 2 View  Differential diagnosis includes pulmonary embolisms, pericarditis, pleurisy secondary to a viral infection, pleurisy secondary to a bacterial infection such as walking pneumonia or bronchitis.  Given the fact the pain began before the cough, I do not believe that this is a pulled muscle in her ribs. Therefore I would like the patient go immediately for chest x-ray. I will begin to treat the patient for a bacterial infection such as walking pneumonia with Zithromax. If symptoms worsen consider doing a CT angiogram of the chest.

## 2014-12-29 ENCOUNTER — Ambulatory Visit (INDEPENDENT_AMBULATORY_CARE_PROVIDER_SITE_OTHER): Payer: 59 | Admitting: Family Medicine

## 2014-12-29 ENCOUNTER — Encounter: Payer: Self-pay | Admitting: Family Medicine

## 2014-12-29 VITALS — BP 108/68 | HR 68 | Temp 98.2°F | Resp 18 | Ht 66.5 in | Wt 255.0 lb

## 2014-12-29 DIAGNOSIS — E669 Obesity, unspecified: Secondary | ICD-10-CM

## 2014-12-29 DIAGNOSIS — Z23 Encounter for immunization: Secondary | ICD-10-CM | POA: Diagnosis not present

## 2014-12-29 NOTE — Progress Notes (Signed)
Subjective:    Patient ID: Belinda Martin, female    DOB: [birth year], 39 y.o.   MRN: [id number]  HPI  12/2013 Patient has been battling obesity. She's been able to lose 7 pounds recently to therapeutic lifestyle changes. She is attending ZUMBA classes 4-5 times per week and exercising for 1 hour in each class. She is also made dietary changes. She is interested in speaking with the nutritionist. She is also to discuss other options for weight loss including medication. She is also concerned about fatigue. Over last 2 weeks she is noticed progressive fatigue. She is frequently falling asleep easily during the day. Recently she was started on Clarinex in addition to her Requip, Singulair, and Qnasal.  She's been on Requip now for quite some time without difficulty as far as fatigue and excessive daytime somnolence. However after the recent addition of the Clarinex she is very sleepy during the day and fatigued.  At that time, my plan was: Gladly schedule the patient to see a nutritionist. I recommended her limiting her dietary caloric intake to 1200-1500 calories per day. I recommended her increasing her aerobic exercise to 1 hour a day 5 days a week. We also discussed medications for weight loss. At the present time the patient is not interested in medication but would like to speak with the nutritionist. I do believe the fatigue and excessive daytime sleepiness is likely due to medication side effects and polypharmacy. I recommended the patient try temporarily discontinuing the Clarinex and see if sedation improves.  12/29/14 Patient is here today for follow-up. She has lost 7 pounds since her last visit. She is eating 1300 cal a day. She is also trying to exercise 30 minutes a day performing aerobic exercise. She is also added in weights and resistance training. Otherwise she is doing well. She is due for her flu shot. She is trying to conceive a child and therefore she is not interested in any  medications to help with weight loss. Past Medical History  Diagnosis Date  . GERD (gastroesophageal reflux disease)   . Fatigue   . Chest pain   . Hair loss     excessive  . Obesity   . Asthma     Allergic  . History of echocardiogram 10/10    EF 65-70%, normal valves  . Irritable bowel syndrome    Past Surgical History  Procedure Laterality Date  . Tonsillectomy and adenoidectomy    . Nasal sinus surgery    . Echo test  10/10    EF 65-70%, normal valves   Current Outpatient Prescriptions on File Prior to Visit  Medication Sig Dispense Refill  . fexofenadine (ALLEGRA) 180 MG tablet Take 180 mg by mouth daily.    . montelukast (SINGULAIR) 10 MG tablet Take 10 mg by mouth at bedtime.    [name] omeprazole (PRILOSEC) 10 MG capsule Take 10 mg by mouth 4 (four) times daily.     No current facility-administered medications on file prior to visit.   Allergies  Allergen Reactions  . Amoxicillin     REACTION: hives   Social History   Social History  . Marital Status: Single    Spouse Name: N/A  . Number of Children: N/A  . Years of Education: N/A   Occupational History  . Mental health assessments     [employer] [facility]   Social History Main Topics  . Smoking status: Never Smoker   . Smokeless tobacco: Never Used  . Alcohol  Use: No  . Drug Use: No  . Sexual Activity: Yes   Other Topics Concern  . 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      Review of Systems  All other systems reviewed and are negative.      Objective:   Physical Exam  Constitutional: She appears well-developed and well-nourished.  Neck: Neck supple. No thyromegaly present.  Cardiovascular: Normal rate, regular rhythm and normal heart sounds.   Pulmonary/Chest: Effort normal and breath sounds normal. No respiratory distress. She has no wheezes. She has no rales.  Abdominal: Soft. Bowel sounds are  normal.  Vitals reviewed.         Assessment & Plan:  Need for prophylactic vaccination and inoculation against influenza - Plan: Flu Vaccine QUAD 36+ mos IM  Obesity  Eventual goal is 5 days a week of 30 minutes to an hour a day of vigorous aerobic exercise and 1300 cal a day diet. We discussed increasing the intensity and frequency of exercise. We also discussed decreasing the consumption of carbs in saturated fat and try to eat more fresh vegetables. Patient received her flu shot today

## 2014-12-30 ENCOUNTER — Ambulatory Visit (INDEPENDENT_AMBULATORY_CARE_PROVIDER_SITE_OTHER): Payer: 59 | Admitting: Family Medicine

## 2014-12-30 ENCOUNTER — Encounter: Payer: Self-pay | Admitting: Family Medicine

## 2014-12-30 VITALS — Temp 98.3°F | Ht 66.5 in | Wt 255.0 lb

## 2014-12-30 DIAGNOSIS — T50B95A Adverse effect of other viral vaccines, initial encounter: Secondary | ICD-10-CM

## 2014-12-30 MED ORDER — PREDNISONE 20 MG PO TABS: ORAL_TABLET | ORAL | Status: DC

## 2014-12-30 NOTE — Progress Notes (Signed)
   Subjective:    Patient ID: Belinda Martin, female    DOB: [birth year], 39 y.o.   MRN: [id number]  HPI Patient received a flu shot in her arm/left shoulder yesterday. Today she reports significant pain in her left axilla. On palpation there is no lymphadenopathy in the left axilla. There is no erythema or swelling at the site of the flu shot in the left shoulder. She has normal range of motion in the shoulder. Past Medical History  Diagnosis Date  . GERD (gastroesophageal reflux disease)   . Fatigue   . Chest pain   . Hair loss     excessive  . Obesity   . Asthma     Allergic  . History of echocardiogram 10/10    EF 65-70%, normal valves  . Irritable bowel syndrome    Past Surgical History  Procedure Laterality Date  . Tonsillectomy and adenoidectomy    . Nasal sinus surgery    . Echo test  10/10    EF 65-70%, normal valves   Current Outpatient Prescriptions on File Prior to Visit  Medication Sig Dispense Refill  . fexofenadine (ALLEGRA) 180 MG tablet Take 180 mg by mouth daily.    . montelukast (SINGULAIR) 10 MG tablet Take 10 mg by mouth at bedtime.    [name] omeprazole (PRILOSEC) 10 MG capsule Take 10 mg by mouth 4 (four) times daily.     No current facility-administered medications on file prior to visit.   Allergies  Allergen Reactions  . Amoxicillin     REACTION: hives   Social History   Social History  . Marital Status: Single    Spouse Name: N/A  . Number of Children: N/A  . Years of Education: N/A   Occupational History  . Mental health assessments     [employer] [facility]   Social History Main Topics  . Smoking status: Never Smoker   . Smokeless tobacco: Never Used  . Alcohol Use: No  . Drug Use: No  . Sexual Activity: Yes   Other Topics Concern  . 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       Review of Systems  All other systems reviewed and are  negative.      Objective:   Physical Exam  Cardiovascular: Normal rate, regular rhythm and normal heart sounds.   No murmur heard. Pulmonary/Chest: Effort normal and breath sounds normal. No respiratory distress. She has no wheezes. She has no rales.  Musculoskeletal:       Left shoulder: She exhibits pain. She exhibits normal range of motion, no tenderness, no bony tenderness, no swelling, no effusion, no crepitus, no deformity, no spasm and normal strength.  Vitals reviewed.         Assessment & Plan:  Reaction to influenza immunization, initial encounter - Plan: predniSONE (DELTASONE) 20 MG tablet  I recommended ibuprofen 800 mg every 8 hours. If pain intensifies, I would recommend a prednisone taper pack to calm the immune response. Patient will try ibuprofen first. There is no evidence of a cellulitis or an abscess secondary to the immunization on examination today

## 2015-01-05 ENCOUNTER — Ambulatory Visit: Payer: 59 | Admitting: Family Medicine

## 2015-01-16 ENCOUNTER — Ambulatory Visit (INDEPENDENT_AMBULATORY_CARE_PROVIDER_SITE_OTHER): Payer: 59 | Admitting: Family Medicine

## 2015-01-16 ENCOUNTER — Encounter: Payer: Self-pay | Admitting: Family Medicine

## 2015-01-16 VITALS — BP 104/66 | HR 80 | Temp 98.2°F | Resp 18 | Wt 252.0 lb

## 2015-01-16 DIAGNOSIS — J029 Acute pharyngitis, unspecified: Secondary | ICD-10-CM

## 2015-01-16 DIAGNOSIS — J069 Acute upper respiratory infection, unspecified: Secondary | ICD-10-CM

## 2015-01-16 LAB — RAPID STREP SCREEN (MED CTR MEBANE ONLY): Streptococcus, Group A Screen (Direct): NEGATIVE

## 2015-01-16 NOTE — Progress Notes (Signed)
Subjective:    Patient ID: Belinda Martin, female    DOB: [birth year], 39 y.o.   MRN: [id number]  HPI  Patient symptoms began Wednesday with a scratchy throat. Thursday she developed sinus congestion, rhinorrhea, and a cough productive of thick green sputum. She reports headache and sinus pain. Right maxillary sinus is the most painful. She continues to have a sore throat. She does report mild pleurisy and chest congestion. She denies any fevers. She denies any nausea vomiting diarrhea. She denies any otalgia. Past Medical History  Diagnosis Date  . GERD (gastroesophageal reflux disease)   . Fatigue   . Chest pain   . Hair loss     excessive  . Obesity   . Asthma     Allergic  . History of echocardiogram 10/10    EF 65-70%, normal valves  . Irritable bowel syndrome    Past Surgical History  Procedure Laterality Date  . Tonsillectomy and adenoidectomy    . Nasal sinus surgery    . Echo test  10/10    EF 65-70%, normal valves   Current Outpatient Prescriptions on File Prior to Visit  Medication Sig Dispense Refill  . fexofenadine (ALLEGRA) 180 MG tablet Take 180 mg by mouth daily.    . montelukast (SINGULAIR) 10 MG tablet Take 10 mg by mouth at bedtime.    [name] omeprazole (PRILOSEC) 10 MG capsule Take 10 mg by mouth 4 (four) times daily.     No current facility-administered medications on file prior to visit.   Allergies  Allergen Reactions  . Amoxicillin     REACTION: hives   Social History   Social History  . Marital Status: Single    Spouse Name: N/A  . Number of Children: N/A  . Years of Education: N/A   Occupational History  . Mental health assessments     [employer] [facility]   Social History Main Topics  . Smoking status: Never Smoker   . Smokeless tobacco: Never Used  . Alcohol Use: No  . Drug Use: No  . Sexual Activity: Yes   Other Topics Concern  . 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      Review of Systems  All other systems reviewed and are negative.      Objective:   Physical Exam  Constitutional: She appears well-developed and well-nourished. No distress.  HENT:  Right Ear: Tympanic membrane, external ear and ear canal normal.  Left Ear: Tympanic membrane, external ear and ear canal normal.  Nose: Mucosal edema and rhinorrhea present.  Mouth/Throat: Oropharynx is clear and moist. No oropharyngeal exudate, posterior oropharyngeal edema, posterior oropharyngeal erythema or tonsillar abscesses.  Cardiovascular: Normal rate, regular rhythm and normal heart sounds.   Pulmonary/Chest: Effort normal and breath sounds normal. No respiratory distress. She has no wheezes. She has no rales.  Lymphadenopathy:    She has no cervical adenopathy.  Skin: She is not diaphoretic.          Assessment & Plan:  Sorethroat - Plan: Rapid strep screen (not at [facility])  Acute upper respiratory infection  Patient has a viral upper respiratory infection. I recommended Sudafed for congestion, Mucinex DM for cough and chest congestion. She can take ibuprofen as needed for fever and sinus pain. Symptoms should gradually improve over the next week. Call back immediately if symptoms worsen. Strep screen is negative. I  did give the patient a prescription for Hycodan 1 teaspoon every 6 hours as needed for cough.

## 2015-05-07 ENCOUNTER — Encounter: Payer: Self-pay | Admitting: Family Medicine

## 2015-05-07 ENCOUNTER — Ambulatory Visit (INDEPENDENT_AMBULATORY_CARE_PROVIDER_SITE_OTHER): Payer: 59 | Admitting: Physician Assistant

## 2015-05-07 ENCOUNTER — Encounter: Payer: Self-pay | Admitting: Physician Assistant

## 2015-05-07 VITALS — BP 108/70 | HR 80 | Temp 98.2°F | Resp 18 | Wt 255.0 lb

## 2015-05-07 DIAGNOSIS — J019 Acute sinusitis, unspecified: Secondary | ICD-10-CM

## 2015-05-07 MED ORDER — METHYLPREDNISOLONE ACETATE 80 MG/ML IJ SUSP
60.0000 mg | Freq: Once | INTRAMUSCULAR | Status: AC
  Administered 2015-05-07: 60 mg via INTRAMUSCULAR

## 2015-05-07 MED ORDER — AZITHROMYCIN 250 MG PO TABS: ORAL_TABLET | ORAL | Status: DC

## 2015-05-07 NOTE — Progress Notes (Signed)
    Patient ID: MCKENNAH STANFORTH MRN: [id number], DOB: 07-12-1975, 40 y.o. Date of Encounter: 05/07/2015, 9:53 AM    Chief Complaint:  Chief Complaint  Patient presents with  . sick x 2 days    sneezing, congestion, chest burns, cough, ears/throat, sinues     HPI: 40 y.o. year old white female presents with above symptoms. Says that her "face hurts ". No known sick contacts but does work in a long-term care facility. Symptoms as above. No known fevers or chills.     Home Meds:   Outpatient Prescriptions Prior to Visit  Medication Sig Dispense Refill  . fexofenadine (ALLEGRA) 180 MG tablet Take 180 mg by mouth daily.    . naproxen (NAPROSYN) 500 MG tablet Take 1 tablet by mouth. As directed    . omeprazole (PRILOSEC) 10 MG capsule Take 10 mg by mouth 4 (four) times daily.    . montelukast (SINGULAIR) 10 MG tablet Take 10 mg by mouth at bedtime. Reported on 05/07/2015     No facility-administered medications prior to visit.    Allergies:  Allergies  Allergen Reactions  . Amoxicillin     REACTION: hives      Review of Systems: See HPI for pertinent ROS. All other ROS negative.    Physical Exam: Blood pressure 108/70, pulse 80, temperature 98.2 F (36.8 C), temperature source Oral, resp. rate 18, weight 255 lb (115.667 kg)., Body mass index is 40.55 kg/(m^2). General:  Obese WF. Appears in no acute distress. HEENT: Normocephalic, atraumatic, eyes without discharge, sclera non-icteric, nares are without discharge. Bilateral auditory canals clear, TM's are without perforation, pearly grey and translucent with reflective cone of light bilaterally. Oral cavity moist, posterior pharynx without exudate, erythema, peritonsillar abscess. Severe tenderness with percussion to maxillary sinuses bilaterally.  Neck: Supple. No thyromegaly. No lymphadenopathy. Lungs: Clear bilaterally to auscultation without wheezes, rales, or rhonchi. Breathing is unlabored. Heart: Regular rhythm. No murmurs,  rubs, or gallops. Msk:  Strength and tone normal for age. Extremities/Skin: Warm and dry. Neuro: Alert and oriented X 3. Moves all extremities spontaneously. Gait is normal. CNII-XII grossly in tact. Psych:  Responds to questions appropriately with a normal affect.     ASSESSMENT AND PLAN:  40 y.o. year old female with  1. Acute sinusitis, recurrence not specified, unspecified location Amoxicillin allergy. Will give Depo-Medrol 60 mg IM now. She is to take azithromycin as directed. Note out of work today and tomorrow --return Monday. Follow-up if symptoms do not resolve upon completion of antibiotic. - azithromycin (ZITHROMAX) 250 MG tablet; Day 1: Take daily. Days 2-5: Take 1 daily.  Dispense: 6 tablet; Refill: 0   Signed, [address] [place], [place], [facility] 05/07/2015 9:53 AM

## 2015-06-24 ENCOUNTER — Encounter: Payer: Self-pay | Admitting: Physician Assistant

## 2015-06-24 ENCOUNTER — Ambulatory Visit (INDEPENDENT_AMBULATORY_CARE_PROVIDER_SITE_OTHER): Payer: 59 | Admitting: Physician Assistant

## 2015-06-24 DIAGNOSIS — J0101 Acute recurrent maxillary sinusitis: Secondary | ICD-10-CM

## 2015-06-24 MED ORDER — PREDNISONE 20 MG PO TABS: ORAL_TABLET | ORAL | Status: DC

## 2015-06-24 NOTE — Progress Notes (Signed)
    Patient ID: JEANETTA TERZO MRN: [id number], DOB: March 26, 1976, 40 y.o. Date of Encounter: 06/24/2015, 10:38 AM    Chief Complaint:  Chief Complaint  Patient presents with  . sinus infection    drainage, facial pain and pressure     HPI: 40 y.o. year old white female presents with above.   States these symptoms just started yesterday, but says that she "wanted to come on in before this got as bad as it did last time."  States that last night she started developing drainage down her throat. Ears feel itchy. Throbbing pressure pain in her maxillary sinuses-- left > right. Is taking Sudafed and ibuprofen. Says that she is only getting drainage out of the left side of her nose.  Says that "they were trying to go this spring without allergy shots--doesn't seem to be going very well so far".   No fevers or chills. No cough or chest congestion. No sore throat.     Home Meds:   Outpatient Prescriptions Prior to Visit  Medication Sig Dispense Refill  . fexofenadine (ALLEGRA) 180 MG tablet Take 180 mg by mouth daily.    . naproxen (NAPROSYN) 500 MG tablet Take 1 tablet by mouth. As directed    . omeprazole (PRILOSEC) 10 MG capsule Take 10 mg by mouth 4 (four) times daily.    . pseudoephedrine (SUDAFED) 30 MG tablet Take 30 mg by mouth every 6 (six) hours as needed for congestion.    . montelukast (SINGULAIR) 10 MG tablet Take 10 mg by mouth at bedtime. Reported on 06/24/2015    . azithromycin (ZITHROMAX) 250 MG tablet Day 1: Take daily. Days 2-5: Take 1 daily. 6 tablet 0   No facility-administered medications prior to visit.    Allergies:  Allergies  Allergen Reactions  . Amoxicillin     REACTION: hives      Review of Systems: See HPI for pertinent ROS. All other ROS negative.    Physical Exam: Blood pressure 114/78, pulse 76, temperature 98.4 F (36.9 C), temperature source Oral, resp. rate 18, weight 261 lb (118.389 kg)., Body mass index is 41.5 kg/(m^2). General:  WF.  Appears in no acute distress. HEENT: Normocephalic, atraumatic, eyes without discharge, sclera non-icteric, nares are without discharge. Bilateral auditory canals clear, TM's are without perforation, pearly grey and translucent with reflective cone of light bilaterally. Oral cavity moist, posterior pharynx without exudate, erythema, peritonsillar abscess. Positive tenderness with percussion to maxillary sinuses bilaterally, Left > Right.    Neck: Supple. No thyromegaly. No lymphadenopathy. Lungs: Clear bilaterally to auscultation without wheezes, rales, or rhonchi. Breathing is unlabored. Heart: Regular rhythm. No murmurs, rubs, or gallops. Msk:  Strength and tone normal for age. Extremities/Skin: Warm and dry. Neuro: Alert and oriented X 3. Moves all extremities spontaneously. Gait is normal. CNII-XII grossly in tact. Psych:  Responds to questions appropriately with a normal affect.     ASSESSMENT AND PLAN:  40 y.o. year old female with  1. Acute recurrent maxillary sinusitis She is to start the prednisone taper immediately and take as directed. She is to follow-up if she develops fever or if develops thick dark drainage that persists greater than 7-10 days. - predniSONE (DELTASONE) 20 MG tablet; Take 3 daily for 2 days, then 2 daily for 2 days, then 1 daily for 2 days.  Dispense: 12 tablet; Refill: 0   Signed, [address] [place], [place], [facility] 06/24/2015 10:38 AM

## 2015-06-30 ENCOUNTER — Telehealth: Payer: Self-pay | Admitting: Family Medicine

## 2015-06-30 MED ORDER — AZITHROMYCIN 250 MG PO TABS: ORAL_TABLET | ORAL | Status: DC

## 2015-06-30 NOTE — Telephone Encounter (Signed)
Pt notified about Rx

## 2015-06-30 NOTE — Telephone Encounter (Signed)
Add z pack

## 2015-06-30 NOTE — Telephone Encounter (Signed)
Much worse.  Congestion thicker, has no voice, green secretions.  Feels AWFUL.  Severe facial pain.  Chest hurts from coughing.

## 2015-06-30 NOTE — Telephone Encounter (Signed)
[phone] Patient is still very sick after seeing mary beth last week, would like a call back regarding this

## 2015-08-18 ENCOUNTER — Other Ambulatory Visit: Payer: Self-pay | Admitting: Certified Nurse Midwife

## 2015-08-18 DIAGNOSIS — Z1231 Encounter for screening mammogram for malignant neoplasm of breast: Secondary | ICD-10-CM

## 2015-09-22 ENCOUNTER — Ambulatory Visit (INDEPENDENT_AMBULATORY_CARE_PROVIDER_SITE_OTHER): Payer: 59 | Admitting: Family Medicine

## 2015-09-22 ENCOUNTER — Encounter: Payer: Self-pay | Admitting: Family Medicine

## 2015-09-22 VITALS — BP 100/76 | HR 82 | Temp 98.4°F | Resp 18 | Ht 66.5 in | Wt 265.0 lb

## 2015-09-22 DIAGNOSIS — J208 Acute bronchitis due to other specified organisms: Secondary | ICD-10-CM | POA: Diagnosis not present

## 2015-09-22 MED ORDER — BENZONATATE 200 MG PO CAPS: 200.0000 mg | ORAL_CAPSULE | Freq: Three times a day (TID) | ORAL | Status: DC | PRN

## 2015-09-22 MED ORDER — AZITHROMYCIN 250 MG PO TABS: ORAL_TABLET | ORAL | Status: DC

## 2015-09-22 NOTE — Progress Notes (Signed)
Subjective:    Patient ID: Belinda Martin, female    DOB: 07-13-1975, 40 y.o.   MRN: [id number]  HPI The patient returned from a trip to [place] on Friday. Since that time she has had a persistent unrelenting cough. The cough is nonproductive. She denies any purulent sputum. She denies any hemoptysis. She denies any fevers or chills. She does report some mild shortness of breath. She is having to use her albuterol every 6 hours due to wheezing. On examination today her lungs are clear with no wheezes crackles Rales. She denies any rhinorrhea. She denies any sinus pain. She denies any sore throat. She denies any postnasal drip Past Medical History  Diagnosis Date  . GERD (gastroesophageal reflux disease)   . Fatigue   . Chest pain   . Hair loss     excessive  . Obesity   . Asthma     Allergic  . History of echocardiogram 10/10    EF 65-70%, normal valves  . Irritable bowel syndrome    Past Surgical History  Procedure Laterality Date  . Tonsillectomy and adenoidectomy    . Nasal sinus surgery    . Echo test  10/10    EF 65-70%, normal valves   Current Outpatient Prescriptions on File Prior to Visit  Medication Sig Dispense Refill  . albuterol (PROVENTIL HFA;VENTOLIN HFA) 108 (90 Base) MCG/ACT inhaler Inhale 1-2 puffs into the lungs every 6 (six) hours as needed for wheezing or shortness of breath.    . fexofenadine (ALLEGRA) 180 MG tablet Take 180 mg by mouth daily.    . montelukast (SINGULAIR) 10 MG tablet Take 10 mg by mouth at bedtime. Reported on 06/24/2015    . naproxen (NAPROSYN) 500 MG tablet Take 1 tablet by mouth. As directed    . omeprazole (PRILOSEC) 10 MG capsule Take 10 mg by mouth 4 (four) times daily.    . pseudoephedrine (SUDAFED) 30 MG tablet Take 30 mg by mouth every 6 (six) hours as needed for congestion.    [name] UNKNOWN TO PATIENT Allergy eye drops from allergist     No current facility-administered medications on file prior to visit.   Allergies  Allergen  Reactions  . Amoxicillin     REACTION: hives   Social History   Social History  . Marital Status: Single    Spouse Name: N/A  . Number of Children: N/A  . Years of Education: N/A   Occupational History  . Mental health assessments     [employer]   Social History Main Topics  . Smoking status: Never Smoker   . Smokeless tobacco: Never Used  . Alcohol Use: No  . Drug Use: No  . Sexual Activity: Yes   Other Topics Concern  . 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       Review of Systems  All other systems reviewed and are negative.      Objective:   Physical Exam  Constitutional: She appears well-developed and well-nourished.  HENT:  Right Ear: External ear normal.  Left Ear: External ear normal.  Nose: Nose normal.  Mouth/Throat: Oropharynx is clear and moist. No oropharyngeal exudate.  Eyes: Conjunctivae are normal.  Neck: Neck supple.  Cardiovascular: Normal rate, regular rhythm and normal heart sounds.   Pulmonary/Chest: Effort normal and breath sounds normal. No respiratory distress. She has no wheezes. She  has no rales.  Lymphadenopathy:    She has no cervical adenopathy.  Vitals reviewed.         Assessment & Plan:  Acute bronchitis due to other specified organisms - Plan: benzonatate (TESSALON) 200 MG capsule, azithromycin (ZITHROMAX) 250 MG tablet  I believe the patient has a viral bronchitis. Recommended tincture of time. Can use Tessalon Perles 200 mg every 8 hours as needed for cough. Symptoms should improve over the next week. Should she develop fever or purulent sputum I also gave her a prescription for a Z-Pak but I recommended she not take it at the present time as this appears to be more likely a viral infection. She can continue to use albuterol as needed. Should the wheezing worsen, we may need to start the patient on prednisone

## 2015-09-23 ENCOUNTER — Telehealth: Payer: Self-pay | Admitting: Family Medicine

## 2015-09-23 NOTE — Telephone Encounter (Signed)
Patient calling to say her face is now hurting, and having drainage coming from her eyes and nose, just had appt with dr Dennard Schaumann, would like to know if antibiotic can be called in  [phone]

## 2015-09-23 NOTE — Telephone Encounter (Signed)
As per Dr. Antony Contras note for 09/22/15  He had sent over an antibx for pt to get if sxs got worse. LMOVM for pt that this was sent in on 09/22/15 and if she was worse to go get it filled.

## 2015-12-21 ENCOUNTER — Encounter: Payer: Self-pay | Admitting: Family Medicine

## 2015-12-21 ENCOUNTER — Ambulatory Visit (INDEPENDENT_AMBULATORY_CARE_PROVIDER_SITE_OTHER): Payer: 59 | Admitting: Family Medicine

## 2015-12-21 VITALS — BP 118/70 | HR 78 | Temp 98.4°F | Resp 18 | Ht 66.5 in | Wt 267.0 lb

## 2015-12-21 DIAGNOSIS — E669 Obesity, unspecified: Secondary | ICD-10-CM | POA: Diagnosis not present

## 2015-12-21 DIAGNOSIS — Z23 Encounter for immunization: Secondary | ICD-10-CM | POA: Diagnosis not present

## 2015-12-21 NOTE — Progress Notes (Signed)
Subjective:    Patient ID: Belinda Martin, female    DOB: 01/07/1976, 40 y.o.   MRN: [id number]  HPI  Patient has been battling obesity. She is here to discuss other options for weight loss including medication. She has been dealing with this for years. She has met with a nutritionist. She is tried exercising an hour 5 days a week. She is tried to change her diet. However he compared her weight today to that of 2 years ago when she implemented therapeutic lifestyle changes she has actually gained 6 pounds.  She admits that she is eating due to stress. She admits that her diet is not as good as it should be. Past Medical History:  Diagnosis Date  . Asthma    Allergic  . Chest pain   . Fatigue   . GERD (gastroesophageal reflux disease)   . Hair loss    excessive  . History of echocardiogram 10/10   EF 65-70%, normal valves  . Irritable bowel syndrome   . Obesity    Past Surgical History:  Procedure Laterality Date  . echo test  10/10   EF 65-70%, normal valves  . NASAL SINUS SURGERY    . TONSILLECTOMY AND ADENOIDECTOMY     Current Outpatient Prescriptions on File Prior to Visit  Medication Sig Dispense Refill  . albuterol (PROVENTIL HFA;VENTOLIN HFA) 108 (90 Base) MCG/ACT inhaler Inhale 1-2 puffs into the lungs every 6 (six) hours as needed for wheezing or shortness of breath.    [name] azithromycin (ZITHROMAX) 250 MG tablet 2 tabs poqday1, 1 tab poqday 2-5 6 tablet 0  . benzonatate (TESSALON) 200 MG capsule Take 1 capsule (200 mg total) by mouth 3 (three) times daily as needed for cough. 30 capsule 0  . fexofenadine (ALLEGRA) 180 MG tablet Take 180 mg by mouth daily.    . montelukast (SINGULAIR) 10 MG tablet Take 10 mg by mouth at bedtime. Reported on 06/24/2015    . naproxen (NAPROSYN) 500 MG tablet Take 1 tablet by mouth. As directed    . omeprazole (PRILOSEC) 10 MG capsule Take 10 mg by mouth 4 (four) times daily.    . pseudoephedrine (SUDAFED) 30 MG tablet Take 30 mg by mouth every  6 (six) hours as needed for congestion.    [name] UNKNOWN TO PATIENT Allergy eye drops from allergist     No current facility-administered medications on file prior to visit.    Allergies  Allergen Reactions  . Amoxicillin     REACTION: hives   Social History   Social History  . Marital status: Single    Spouse name: N/A  . Number of children: N/A  . Years of education: N/A   Occupational History  . Mental health assessments     [employer] [facility]   Social History Main Topics  . Smoking status: Never Smoker  . Smokeless tobacco: Never Used  . Alcohol use No  . Drug use: No  . Sexual activity: Yes   Other Topics Concern  . 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      Review of Systems  All other systems reviewed and are negative.      Objective:   Physical Exam  Constitutional: She appears well-developed and well-nourished.  Neck: Neck supple. No thyromegaly present.  Cardiovascular: Normal rate, regular rhythm and normal heart sounds.   Pulmonary/Chest: Effort  normal and breath sounds normal. No respiratory distress. She has no wheezes. She has no rales.  Abdominal: Soft. Bowel sounds are normal.  Vitals reviewed.         Assessment & Plan:  Needs flu shot - Plan: Flu Vaccine QUAD 36+ mos IM Obesity  Patient must continue therapeutic lifestyle changes to have any attempt to successfully lose weight. This includes limiting her calorie intake to 1200-1500 cal per day maximum, eating a low saturated fat low-carb 100 diet, and 1 hour a day 5 days a week of aerobic exercise. In addition I will start the patient on belviq 10 mg pobid.  Recheck in 6 months.

## 2016-01-27 ENCOUNTER — Telehealth: Payer: Self-pay | Admitting: Family Medicine

## 2016-01-27 NOTE — Telephone Encounter (Signed)
Pt called and states that the Belviq is too expensive on her insurance and would like to know if you could change it to something cheaper?

## 2016-01-28 NOTE — Telephone Encounter (Signed)
Can she check with her insurance and see what options are on her formulary.

## 2016-01-28 NOTE — Telephone Encounter (Signed)
Pt aware and will call insurance and call me back

## 2016-05-04 ENCOUNTER — Telehealth: Payer: Self-pay | Admitting: Family Medicine

## 2016-05-04 NOTE — Telephone Encounter (Signed)
Pt call LMOVM stating that you had talked about belviq in her lov and it was too expensive - she checked with her insurance and they will cover Regimex and Phentermine if either of those will work for her or does she need an OV?  CB# [phone]

## 2016-05-06 MED ORDER — PHENTERMINE HCL 37.5 MG PO TABS: 37.5000 mg | ORAL_TABLET | Freq: Every day | ORAL | 2 refills | Status: DC

## 2016-05-06 NOTE — Telephone Encounter (Signed)
Medication called/sent to requested pharmacy and pt aware via vm 

## 2016-05-06 NOTE — Telephone Encounter (Signed)
adipex 37.5 poqam (30) RF 2

## 2017-01-03 ENCOUNTER — Other Ambulatory Visit: Payer: Self-pay | Admitting: Orthopedic Surgery

## 2017-01-03 DIAGNOSIS — M545 Low back pain: Secondary | ICD-10-CM

## 2017-01-13 ENCOUNTER — Ambulatory Visit
Admission: RE | Admit: 2017-01-13 | Discharge: 2017-01-13 | Disposition: A | Discharge status: Home / Self Care | Payer: 59 | Source: Ambulatory Visit | Attending: Orthopedic Surgery | Admitting: Orthopedic Surgery

## 2017-01-13 DIAGNOSIS — M545 Low back pain: Secondary | ICD-10-CM

## 2017-02-13 ENCOUNTER — Other Ambulatory Visit: Payer: Self-pay

## 2017-02-13 ENCOUNTER — Ambulatory Visit: Payer: 59 | Admitting: Family Medicine

## 2017-02-13 ENCOUNTER — Encounter: Payer: Self-pay | Admitting: Family Medicine

## 2017-02-13 VITALS — BP 122/76 | HR 88 | Temp 98.8°F | Resp 16 | Ht 67.0 in | Wt 276.0 lb

## 2017-02-13 DIAGNOSIS — J4521 Mild intermittent asthma with (acute) exacerbation: Secondary | ICD-10-CM | POA: Diagnosis not present

## 2017-02-13 DIAGNOSIS — J019 Acute sinusitis, unspecified: Secondary | ICD-10-CM | POA: Diagnosis not present

## 2017-02-13 MED ORDER — AZITHROMYCIN 250 MG PO TABS: ORAL_TABLET | ORAL | 0 refills | Status: DC

## 2017-02-13 MED ORDER — ALBUTEROL SULFATE HFA 108 (90 BASE) MCG/ACT IN AERS
1.0000 | INHALATION_SPRAY | Freq: Four times a day (QID) | RESPIRATORY_TRACT | 11 refills | Status: DC | PRN
Start: 2017-02-13 — End: 2017-02-28

## 2017-02-13 MED ORDER — PREDNISONE 10 MG PO TABS: ORAL_TABLET | ORAL | 0 refills | Status: DC

## 2017-02-13 MED ORDER — METHYLPREDNISOLONE ACETATE 40 MG/ML IJ SUSP
40.0000 mg | Freq: Once | INTRAMUSCULAR | Status: AC
  Administered 2017-02-13: 40 mg via INTRAMUSCULAR

## 2017-02-13 MED ORDER — AZELASTINE HCL 0.05 % OP SOLN: 1.0000 [drp] | Freq: Two times a day (BID) | OPHTHALMIC | 12 refills | Status: DC | PRN

## 2017-02-13 NOTE — Progress Notes (Signed)
   Subjective:    Patient ID: Belinda Martin, female    DOB: [birth year], 41 y.o.   MRN: [id number]  Patient presents for Illness (x3 days- sinus pressure, nasal congestion, HA, ear pain, chest congestion, post nasal drip, sore throat, productive cough)   Pt here with sinus pressure, drainage, headache, cough, sore throat, post nasal drip for past 4 days. Face hurts to touch History of asthma and sinus infections in the past. Taking Sudafed and anti-histamines / Robitussin DM Wheezing started last night, had to sleep in recliner last night due to SOB Eyes running as well. She has eye allergies but her eye drop expired  Her inhaler  Expired       Review Of Systems:  GEN- denies fatigue, fever, weight loss,weakness, recent illness HEENT- denies eye drainage, change in vision, +nasal discharge, CVS- denies chest pain, palpitations RESP- denies SOB+, cough, wheeze ABD- denies N/V, change in stools, abd pain  MSK- denies joint pain, muscle aches, injury Neuro- denies headache, dizziness, syncope, seizure activity       Objective:    BP 122/76   Pulse 88   Temp 98.8 F (37.1 C) (Oral)   Resp 16   Ht 5\' 7"  (1.702 m)   Wt 276 lb (125.2 kg)   SpO2 97%   BMI 43.23 kg/m  GEN- NAD, alert and oriented x3 HEENT- PERRL, EOMI, non injected sclera, pink conjunctiva, MMM, oropharynx mild injection, TM clear bilat no effusion,  + maxillary sinus tenderness, inflammed turbinates,  Nasal drainage  Neck- Supple, no LAD CVS- RRR, no murmur RESP-CTAB EXT- No edema Pulses- Radial 2+         Assessment & Plan:      Problem List Items Addressed This Visit    None    Visit Diagnoses    Acute non-recurrent sinusitis, unspecified location    -  Primary   Treat with azithromycin, comntinue nasal sprays, allergy meds. Eye allergy drop refilled. For her asthma, I think the post nasal is causing an irritation, given Medrol injection in the office followed by prednisone taper.  Refilled her  albuterol inhaler   Relevant Medications   methylPREDNISolone acetate (DEPO-MEDROL) injection 40 mg (Completed)   azithromycin (ZITHROMAX) 250 MG tablet   predniSONE (DELTASONE) 10 MG tablet   Mild intermittent asthma with exacerbation       Relevant Medications   methylPREDNISolone acetate (DEPO-MEDROL) injection 40 mg (Completed)   predniSONE (DELTASONE) 10 MG tablet   albuterol (PROVENTIL HFA;VENTOLIN HFA) 108 (90 Base) MCG/ACT inhaler      Note: This dictation was prepared with Dragon dictation along with smaller phrase technology. Any transcriptional errors that result from this process are unintentional.

## 2017-02-13 NOTE — Patient Instructions (Signed)
F/u as needed

## 2017-02-14 ENCOUNTER — Telehealth: Payer: Self-pay | Admitting: *Deleted

## 2017-02-14 NOTE — Telephone Encounter (Signed)
It has only been 24 hours, she needs to complete the medications

## 2017-02-14 NOTE — Telephone Encounter (Signed)
Received call from patient.   States that she has been using ABTx, Steroids, and cough syrup with no relief from cough.   Requested MD to advise if there is any other treatment she can try.

## 2017-02-14 NOTE — Telephone Encounter (Signed)
Patient returned call and made aware.

## 2017-02-14 NOTE — Telephone Encounter (Signed)
Call placed to patient. LMTRC.  

## 2017-02-27 ENCOUNTER — Telehealth: Payer: Self-pay | Admitting: Family Medicine

## 2017-02-27 NOTE — Telephone Encounter (Signed)
Call placed to patient. LMTRC.  

## 2017-02-27 NOTE — Telephone Encounter (Signed)
Pt was seen for sinus infection and she is still sick, wants to know if we can call in something else. Please call her back, [facility], if we do call in something.

## 2017-02-28 MED ORDER — ALBUTEROL SULFATE HFA 108 (90 BASE) MCG/ACT IN AERS: 1.0000 | INHALATION_SPRAY | Freq: Four times a day (QID) | RESPIRATORY_TRACT | 11 refills | Status: DC | PRN

## 2017-02-28 MED ORDER — LEVOFLOXACIN 500 MG PO TABS: 500.0000 mg | ORAL_TABLET | Freq: Every day | ORAL | 0 refills | Status: DC

## 2017-02-28 NOTE — Telephone Encounter (Signed)
Call in [place] 500mg  once a day for 7 days Use nasal saline and nasacort She can take Sudafed behind the counter for 3 days  She has inhaler and cough medicine If not improved can come in Friday for recheck

## 2017-02-28 NOTE — Telephone Encounter (Signed)
Call placed to patient and patient made aware.   Prescription sent to pharmacy.  

## 2017-02-28 NOTE — Telephone Encounter (Signed)
Patient returned call.   States that she continues to have R sided facial pain/ pressure with green mucus draining from nasal passage. She also has some blood tinged mucus from R nare. Also states that she continues to have productive cough with green colored mucus that worsens at night. Reports increased fatigue and HA. Denies fever.   MD please advise.

## 2017-05-16 ENCOUNTER — Telehealth: Payer: Self-pay

## 2017-05-16 NOTE — Telephone Encounter (Signed)
No further advice. Thank you.

## 2017-05-16 NOTE — Telephone Encounter (Signed)
Pt called triage line stating she has a NOB appointment on Thursday 2/14. She is unsure of her exact LMP, but most likely it was the beginning of December. She has noticed a few drops of blood yesterday morning and  1-2 drops last night. No intercourse w/i last 24 hours.She has had gas but no other cramping.   Pt aware there are no openings today, aware it could be inplantation spotting. Advised pelvic rest and if S&S worsen or cramping starts then she should go to the ER. Otherwise waiting for her appointment on Thursday should be fine.   Message sent to provider for other advise. If none then chart can be closed.

## 2017-05-17 ENCOUNTER — Telehealth: Payer: Self-pay

## 2017-05-17 NOTE — Telephone Encounter (Signed)
Pt calling, spoke c nurse yesterday re light spotting.  Today she saw small chunks/stringy in toilet, lower back pain and in hips.  Has appt tomorrow 10:30. [phone] Left detailed if pain doubles her over or she starts heavy bleeding to go to ED.  Otherwise, keep appt tomorrow.  May take e.s. Tylenol and apply heat 27min qhr.

## 2017-05-18 ENCOUNTER — Other Ambulatory Visit (INDEPENDENT_AMBULATORY_CARE_PROVIDER_SITE_OTHER): Payer: Managed Care, Other (non HMO)

## 2017-05-18 ENCOUNTER — Other Ambulatory Visit: Payer: Self-pay | Admitting: Advanced Practice Midwife

## 2017-05-18 ENCOUNTER — Encounter: Payer: Self-pay | Admitting: Advanced Practice Midwife

## 2017-05-18 ENCOUNTER — Ambulatory Visit (INDEPENDENT_AMBULATORY_CARE_PROVIDER_SITE_OTHER): Payer: Managed Care, Other (non HMO) | Admitting: Advanced Practice Midwife

## 2017-05-18 VITALS — BP 128/88 | Wt 262.0 lb

## 2017-05-18 DIAGNOSIS — O99211 Obesity complicating pregnancy, first trimester: Secondary | ICD-10-CM | POA: Diagnosis not present

## 2017-05-18 DIAGNOSIS — O0990 Supervision of high risk pregnancy, unspecified, unspecified trimester: Secondary | ICD-10-CM

## 2017-05-18 DIAGNOSIS — O0991 Supervision of high risk pregnancy, unspecified, first trimester: Secondary | ICD-10-CM | POA: Diagnosis not present

## 2017-05-18 DIAGNOSIS — O09519 Supervision of elderly primigravida, unspecified trimester: Secondary | ICD-10-CM

## 2017-05-18 DIAGNOSIS — Z3A10 10 weeks gestation of pregnancy: Secondary | ICD-10-CM | POA: Diagnosis not present

## 2017-05-18 DIAGNOSIS — O99210 Obesity complicating pregnancy, unspecified trimester: Secondary | ICD-10-CM

## 2017-05-18 DIAGNOSIS — O09511 Supervision of elderly primigravida, first trimester: Secondary | ICD-10-CM | POA: Diagnosis not present

## 2017-05-18 NOTE — Progress Notes (Signed)
NOB today. Pt had some spotting yesterday, also some cramping

## 2017-05-19 ENCOUNTER — Other Ambulatory Visit: Payer: Self-pay | Admitting: Advanced Practice Midwife

## 2017-05-19 ENCOUNTER — Telehealth: Payer: Self-pay | Admitting: Advanced Practice Midwife

## 2017-05-19 ENCOUNTER — Other Ambulatory Visit: Payer: Managed Care, Other (non HMO)

## 2017-05-19 ENCOUNTER — Encounter: Payer: Self-pay | Admitting: Advanced Practice Midwife

## 2017-05-19 ENCOUNTER — Ambulatory Visit: Payer: Managed Care, Other (non HMO)

## 2017-05-19 DIAGNOSIS — O0990 Supervision of high risk pregnancy, unspecified, unspecified trimester: Secondary | ICD-10-CM

## 2017-05-19 NOTE — Telephone Encounter (Signed)
[place] desk knows to put on schedule and will let me know when she gets here. I will let JEG know results

## 2017-05-19 NOTE — Progress Notes (Signed)
New Obstetric Patient H&P    Chief Complaint: "Desires prenatal care" Patient had positive pregnancy test at home 1 week ago. She had some pink spotting starting on Tuesday and then last night and today she is having red bleeding that is more like a period.   History of Present Illness: Patient is a 42 y.o. G1P0 Not Hispanic or Latino female, LMP 03/06/2017 presents with amenorrhea and positive home pregnancy test. Based on her LMP, her EDD is Estimated Date of Delivery: 12/11/2017. and her EGA is [redacted]w[redacted]d. Cycles are 5. days, regular, and occur approximately every : 28 days. Her last pap smear was 1 years ago and was no abnormalities.    She had a urine pregnancy test which was positive 1 week(s)  ago. Her last menstrual period was normal and lasted for  5 day(s). She did have some lighter bleeding than usual for only 3 days at the beginning of January. Since her LMP she claims she has experienced breast tenderness and fatigue. Her past medical history is contributory for allergies and asthma. She has a history of infertility with counseling/evaluation done in the past. This is her first pregnancy.   Since her LMP, she admits to the use of tobacco products  no She claims she has gained   no pounds since the start of her pregnancy.  There are cats in the home in the home  no  She admits close contact with children on a regular basis  no  She has had chicken pox in the past yes She has had Tuberculosis exposures, symptoms, or previously tested positive for TB   no Current or past history of domestic violence. no  Genetic Screening/Teratology Counseling: (Includes patient, baby's father, or anyone in either family with:)   70. Patient's age >/= 65 at [facility]  yes 2. Thalassemia ([place], [place], [place], or Asian background): MCV<80  no 3. Neural tube defect (meningomyelocele, spina bifida, anencephaly)  no 4. Congenital heart defect  no  5. Down syndrome  no 6. Tay-Sachs (Jewish, [place])  no 7. Canavan's Disease  no 8. Sickle cell disease or trait (African)  no  9. Hemophilia or other blood disorders  no  10. Muscular dystrophy  no  11. Cystic fibrosis  no  12. Huntington's Chorea  no  13. Mental retardation/autism  Patient has maternal uncle with mental retardation 14. Other inherited genetic or chromosomal disorder  no 15. Maternal metabolic disorder (DM, PKU, etc)  no 16. Patient or FOB with a child with a birth defect not listed above no  16a. Patient or FOB with a birth defect themselves no 17. Recurrent pregnancy loss, or stillbirth  no  18. Any medications since LMP other than prenatal vitamins (include vitamins, supplements, OTC meds, drugs, alcohol)  no 19. Any other genetic/environmental exposure to discuss  no  Infection History:   1. Lives with someone with TB or TB exposed  no  2. Patient or partner has history of genital herpes  no 3. Rash or viral illness since LMP  no 4. History of STI (GC, CT, HPV, syphilis, HIV)  no 5. History of recent travel :  no  Other pertinent information:  no     Review of Systems:10 point review of systems negative unless otherwise noted in HPI  Past Medical History:  Past Medical History:  Diagnosis Date  . Asthma    Allergic  . Chest pain   . Fatigue   . GERD (gastroesophageal reflux disease)   .  Hair loss    excessive  . History of echocardiogram 10/10   EF 65-70%, normal valves  . Irritable bowel syndrome   . Obesity     Past Surgical History:  Past Surgical History:  Procedure Laterality Date  . echo test  10/10   EF 65-70%, normal valves  . NASAL SINUS SURGERY    . TONSILLECTOMY AND ADENOIDECTOMY      Gynecologic History: Patient's last menstrual period was 03/06/2017.  Obstetric History: G1P0  Family History:  Family History  Problem Relation Age of Onset  . Arrhythmia Mother   . Heart murmur Mother   . Arrhythmia Father   . Heart failure Paternal Grandfather        CHF  .  Heart murmur Paternal Uncle   . Coronary artery disease Neg Hx        premature CAD    Social History:  Social History   Socioeconomic History  . Marital status: Single    Spouse name: Not on file  . Number of children: Not on file  . Years of education: Not on file  . Highest education level: Not on file  Social Needs  . Financial resource strain: Not on file  . Food insecurity - worry: Not on file  . Food insecurity - inability: Not on file  . Transportation needs - medical: Not on file  . Transportation needs - non-medical: Not on file  Occupational History  . Occupation: Mental health assessments    Comment: [id number]  Tobacco Use  . Smoking status: Never Smoker  . Smokeless tobacco: Never Used  Substance and Sexual Activity  . Alcohol use: No  . Drug use: No  . Sexual activity: Yes    Birth control/protection: None  Other Topics Concern  . 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     Allergies:  Allergies  Allergen Reactions  . Amoxicillin     REACTION: hives    Medications: Prior to Admission medications   Medication Sig Start Date End Date Taking? Authorizing Provider  albuterol (PROVENTIL HFA;VENTOLIN HFA) 108 (90 Base) MCG/ACT inhaler Inhale 1-2 puffs into the lungs every 6 (six) hours as needed for wheezing or shortness of breath. Patient not taking: Reported on 05/18/2017 02/28/17   Alycia Rossetti, MD  azelastine (OPTIVAR) 0.05 % ophthalmic solution Place 1 drop 2 (two) times daily as needed into both eyes. Patient not taking: Reported on 05/18/2017 02/13/17   Alycia Rossetti, MD  fexofenadine (ALLEGRA) 180 MG tablet Take 180 mg by mouth daily.    [provider]  levofloxacin (LEVAQUIN) 500 MG tablet Take 1 tablet (500 mg total) by mouth daily. Patient not taking: Reported on 05/18/2017 02/28/17   Alycia Rossetti, MD  naproxen  (NAPROSYN) 500 MG tablet Take 1 tablet by mouth. As directed 01/07/15   [provider]  pseudoephedrine (SUDAFED) 30 MG tablet Take 30 mg by mouth every 6 (six) hours as needed for congestion.    [provider]    Physical Exam Vitals: Blood pressure 128/88, weight 262 lb (118.8 kg), last menstrual period 03/06/2017.  General: NAD HEENT: normocephalic, anicteric Thyroid: no enlargement, no palpable nodules Pulmonary: No increased work of breathing, CTAB Cardiovascular: RRR, distal pulses 2+ Abdomen: NABS, soft, non-tender, non-distended.  Umbilicus without lesions.  No hepatomegaly, splenomegaly or masses palpable. No evidence of hernia   Genitourinary:  deferred: ultrasound due to bleeding   Extremities: no edema, erythema, or tenderness Neurologic: Grossly intact Psychiatric: mood appropriate, affect full  ULTRASOUND REPORT  Location: [facility] Date of Service: 05/18/2017   Indications:Threatened AB Findings:  Intrauterine pregnancy is NOT visualized. Endometrium is slightly thickened and blood is seen in the cervical canal.   FHR: N/A CRL measurement: N/A Yolk sac is not visualized  Amnion: not visualized   Right Ovary is normal in appearance. Left Ovary is normal appearance. Corpus luteal cyst:  is not visualized Survey of the adnexa demonstrates no adnexal masses. There is no free peritoneal fluid in the cul de sac.  Impression: 1. No evidence of intrauterine pregnancy   Recommendations: 1.Clinical correlation with the patient's History and Physical Exam.  Edwena Bunde, RDMS, RVT  There is a no intrauterine pregnancy visualized in today's study.  Please correleate with urine pregnancy test.  If pregnancy test positive no further follow up required.  Should pregnancy test be positive trend HCG levels and obtain follow up ultrasound if rising.  In the setting of a documented positive pregnancy test this could represent an  early gestation below the resolution of the ultrasound, a spontaneous miscarriage, or ectopic.     Malachy Mood, MD, [facility], [facility] 05/18/2017, 2:05 PM.   Assessment: 42 y.o. G1P0 with no intrauterine pregnancy on ultrasound. This could represent early gestation, spontaneous miscarriage, or ectopic.   Plan: 1. Obtain urine pregnancy test 2. Trend Hcg levels if UPT is positive 3. F/U in clinic PRN 4. Preconception health/counseling: increase healthy lifestyle diet/exercise, take folate supplement   Rod Can, [place], [facility] 05/19/2017, 1:12 PM

## 2017-05-19 NOTE — Telephone Encounter (Signed)
Spoke with patient and she will go by the office to do UPT and I will let her know results following.

## 2017-05-19 NOTE — Progress Notes (Signed)
Patient at office for UPT per Dr Georgianne Fick recommendation. UPT is positive. Beta Hcg ordered for today and Monday.

## 2017-05-19 NOTE — Telephone Encounter (Signed)
Patient called wanting to know when the bleeding and cramping would subside from a miscarriage.  Please call back.  [phone]

## 2017-05-19 NOTE — Telephone Encounter (Signed)
Left message regarding recommendation for follow up with another urine pregnancy test.

## 2017-05-20 LAB — BETA HCG QUANT (REF LAB): hCG Quant: 2659 m[IU]/mL

## 2017-05-22 ENCOUNTER — Other Ambulatory Visit: Payer: Managed Care, Other (non HMO)

## 2017-05-22 DIAGNOSIS — O0990 Supervision of high risk pregnancy, unspecified, unspecified trimester: Secondary | ICD-10-CM

## 2017-05-23 LAB — BETA HCG QUANT (REF LAB): HCG QUANT: 2165 m[IU]/mL

## 2017-05-26 ENCOUNTER — Ambulatory Visit (INDEPENDENT_AMBULATORY_CARE_PROVIDER_SITE_OTHER): Payer: Managed Care, Other (non HMO)

## 2017-05-26 DIAGNOSIS — Z23 Encounter for immunization: Secondary | ICD-10-CM

## 2017-05-26 NOTE — Progress Notes (Signed)
Patient was in office for flu injection.patient received injection in her left deltoid patient tolerated well.

## 2017-05-29 ENCOUNTER — Encounter: Payer: Self-pay | Admitting: Certified Nurse Midwife

## 2017-05-29 ENCOUNTER — Ambulatory Visit (INDEPENDENT_AMBULATORY_CARE_PROVIDER_SITE_OTHER): Payer: Managed Care, Other (non HMO) | Admitting: Certified Nurse Midwife

## 2017-05-29 VITALS — BP 122/82 | HR 81 | Ht 66.0 in | Wt 279.0 lb

## 2017-05-29 DIAGNOSIS — E669 Obesity, unspecified: Secondary | ICD-10-CM | POA: Insufficient documentation

## 2017-05-29 DIAGNOSIS — O039 Complete or unspecified spontaneous abortion without complication: Secondary | ICD-10-CM

## 2017-05-29 DIAGNOSIS — Z124 Encounter for screening for malignant neoplasm of cervix: Secondary | ICD-10-CM | POA: Diagnosis not present

## 2017-05-29 DIAGNOSIS — Z8349 Family history of other endocrine, nutritional and metabolic diseases: Secondary | ICD-10-CM | POA: Diagnosis not present

## 2017-05-29 DIAGNOSIS — Z01419 Encounter for gynecological examination (general) (routine) without abnormal findings: Secondary | ICD-10-CM

## 2017-05-29 DIAGNOSIS — G932 Benign intracranial hypertension: Secondary | ICD-10-CM | POA: Insufficient documentation

## 2017-05-29 DIAGNOSIS — Z8742 Personal history of other diseases of the female genital tract: Secondary | ICD-10-CM

## 2017-05-29 DIAGNOSIS — M5136 Other intervertebral disc degeneration, lumbar region: Secondary | ICD-10-CM | POA: Insufficient documentation

## 2017-05-29 DIAGNOSIS — G479 Sleep disorder, unspecified: Secondary | ICD-10-CM | POA: Insufficient documentation

## 2017-05-29 DIAGNOSIS — Z87898 Personal history of other specified conditions: Secondary | ICD-10-CM | POA: Diagnosis not present

## 2017-05-29 DIAGNOSIS — K589 Irritable bowel syndrome without diarrhea: Secondary | ICD-10-CM | POA: Insufficient documentation

## 2017-05-29 NOTE — Progress Notes (Signed)
Gynecology Annual Exam  PCP: [name], MD  Chief Complaint:  Chief Complaint  Patient presents with  . Gynecologic Exam    History of Present Illness:Belinda Martin is a 42 year old Caucasian/White female, G63 P0000, who presents for her annual exam. She recently conceived but unfortunately had started to bleed and cramp before her initial prenatal visit and was passing tissue on 15 and 16 February.  A beta HCG 2/15 was 2659 and another on 2/18 was 2165. She finally stopped bleeding on 22 February.  Her menses are usually regular and her LNMP was 03/06/2017 and her last LMP was early January 2019 (lite). They usually occur every 1 month , they last 4-5 days , are medium flow with 3-4 heavier days , and are without clots.  She reports dysmenorrhea. 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 Her low back pain seems to worsen with her menses. Her orthopedic doctor wanted her to ask if there is any gyn problem that may be causing worsening LBP during her meses Since her last annual GYN exam dated 09/23/2015, she has gained 17#. Patient reports losing some weight prior to conceiving. She is sexually active. She has not used contraception x6 years. She was wanting to conceive. Did see a reproductive endocrinologist 2016. Husband did not want to do sperm analysis and she has not been back to the infertility physicians.  Her most recent pap smear was obtained 09/23/2015 and was NIL with negative HRHPV.  Her most recent mammogram obtained on 03/12/2014 was normal.  There is no family history of breast cancer.  There is no family history of ovarian cancer.  The patient does do monthly self breast exams.  The patient does  not smoke.  The patient does drink infrequently.  The patient does not use illegal drugs.  The patient was exercising regularly until she had her first positive pregnancy test.  She had a recent cholesterol screen in 2017 that she thinks was normal.  Screening today for depression was negative. EDPS was 4; GAD7 was 2   Review of Systems: Review of Systems  Constitutional: Negative for chills, fever and weight loss.  HENT: Negative for congestion, sinus pain and sore throat.   Eyes: Negative for blurred vision and pain.  Respiratory: Negative for hemoptysis, shortness of breath and wheezing.   Cardiovascular: Negative for chest pain, palpitations and leg swelling.  Gastrointestinal: Negative for abdominal pain, blood in stool, diarrhea, heartburn, nausea and vomiting.  Genitourinary: Negative for dysuria, frequency, hematuria and urgency.       Positive for dysmenorrhea  Musculoskeletal: Positive for back pain. Negative for joint pain and myalgias.  Skin: Negative for itching and rash.  Neurological: Negative for dizziness, tingling and headaches.  Endo/Heme/Allergies: Positive for environmental allergies. Negative for polydipsia. Does not bruise/bleed easily.       Negative for hirsutism   Psychiatric/Behavioral: Negative for depression. The patient is not nervous/anxious and does not have insomnia.     Past Medical History:  Past Medical History:  Diagnosis Date  . Asthma    Allergic  . Chest pain   . DDD (degenerative disc disease), lumbar   . Depression    as adolescent  .  Fatigue   . GERD (gastroesophageal reflux disease)   . Hair loss    excessive  . History of abnormal cervical Pap smear 2009   ASCUS with positive HRHPV; colpo:HPV effect  . History of echocardiogram 10/10   EF 65-70%, normal valves  . History of genital warts 1998  . Irritable bowel syndrome   . Obesity   . Pseudotumor cerebri   . 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 echo test  10/10   EF 65-70%, normal valves  . Electrocautery of condyloma accuminata  09/2007   Dr Rayford Halsted  . NASAL SINUS SURGERY    . TONSILLECTOMY      Family History:  Family History  Problem Relation Age of Onset  . Arrhythmia Mother   . Heart murmur Mother   . Endometriosis Mother   . Fibromyalgia Mother   . Irritable bowel syndrome Mother   . Deep vein thrombosis Father        MTHFR homozygous  . Atrial fibrillation Father   . Hypertension Father   . Heart failure Paternal Grandfather        CHF  . Stomach cancer Paternal Grandfather   . Crohn's disease Maternal Grandmother   . Diabetes Maternal Grandmother   . Heart murmur Paternal Uncle   . Coronary artery disease Neg Hx        premature CAD    Social History:  Social History   Socioeconomic History  . Marital status: Married    Spouse name: Not on file  . Number of children: 0  . Years of education: Not on file  . Highest education level: Not on file  Social Needs  . Financial resource strain: Not on file  . Food insecurity - worry: Not on file  . Food insecurity - inability: Not on file  . Transportation needs - medical: Not on file  . Transportation needs - non-medical: Not on file  Occupational History  . Occupation: Mental health assessments    Comment: [employer]  Tobacco Use  . Smoking status: Never Smoker  . Smokeless tobacco: Never Used  Substance and Sexual Activity  . Alcohol use: No  . Drug use: No  . Sexual activity: Yes    Partners: Male    Birth control/protection: None  Other Topics Concern  . 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     Allergies:  Allergies  Allergen Reactions  . Amoxicillin     REACTION: hives    Medications:  Current Outpatient Medications:  .  fexofenadine (ALLEGRA) 180 MG  tablet, Take 180 mg by mouth daily., Disp: , Rfl:  .  folic acid (FOLVITE) 1 MG tablet, Take 3 mg by mouth daily., Disp: , Rfl:  .  Glucosamine 500 MG CAPS, Take 1,000 mg by mouth daily., Disp: , Rfl:  .  naproxen (NAPROSYN) 250 MG tablet, Take 250 mg by mouth 2 (two) times daily as needed., Disp: , Rfl:  .  Omega-3 Fatty Acids (FISH OIL) 1000 MG CAPS, Take 2 capsules by mouth daily., Disp: , Rfl:  .  vitamin B-12 (CYANOCOBALAMIN) 500 MCG tablet, Take 1,000 mcg by mouth daily., Disp: , Rfl:  .  albuterol (PROVENTIL HFA;VENTOLIN HFA) 108 (90 Base) MCG/ACT inhaler, Inhale 1-2 puffs into the lungs  every 6 (six) hours as needed for wheezing or shortness of breath. (Patient not taking: Reported on 05/18/2017), Disp: 18 g, Rfl: 11 Physical Exam Vitals: BP 122/82   Pulse 81   Ht 5\' 6"  (1.676 m)   Wt 279 lb (126.6 kg)   LMP 03/06/2017 Comment: recent miscarriage  Breastfeeding? Unknown   BMI 45.03 kg/m   General: WF, tearful at times when talking about miscarriage HEENT: normocephalic, anicteric Neck: no thyroid enlargement, no palpable nodules, no cervical lymphadenopathy  Pulmonary: No increased work of breathing, CTAB Cardiovascular: RRR, without murmur  Breast: Breast symmetrical, no tenderness, no palpable nodules or masses, no skin or nipple retraction present, no nipple discharge.  No axillary, infraclavicular or supraclavicular lymphadenopathy. Abdomen: Soft, non-tender, obese, non-distended.  Umbilicus without lesions.  No hepatomegaly or masses palpable. No evidence of hernia. Genitourinary:  External: Normal external female genitalia.  Normal urethral meatus, normal Bartholin's and Skene's  glands.    Vagina: Normal vaginal mucosa, no evidence of prolapse.    Cervix: Grossly normal in appearance, no bleeding, non-tender  Uterus: Anteverted, normal size, shape, and consistency, mobile, and non-tender  Adnexa: No adnexal masses, non-tender  Rectal: deferred  Lymphatic: no evidence of  inguinal lymphadenopathy Extremities: no edema, erythema, or tenderness Neurologic: Grossly intact Psychiatric: mood appropriate, affect full     Assessment: 42 y.o. G1P0010 for annual gyn exam Probable incomplete AB  Will get beta HCG and follow till negative Family history of MTHFR  Desires genetic testing for MTHFR-test ordered    Plan:   1) Breast cancer screening - recommend monthly self breast exams and beginning next year annual screening mammograms.   2) Cervical cancer screening - Pap was done.   3) Contraception - desires conception  4) Routine healthcare maintenance including cholesterol and diabetes screening managed by PCP   Dalia Heading, CNM    Addendum: did not realize until patient had left, that ABO and RH not done. Patient called and will come in tomorrow AM for test. Will need Rhogam if RH negative.  Dalia Heading, CNM

## 2017-05-30 ENCOUNTER — Other Ambulatory Visit: Payer: Managed Care, Other (non HMO)

## 2017-05-30 ENCOUNTER — Telehealth: Payer: Self-pay | Admitting: Certified Nurse Midwife

## 2017-05-30 DIAGNOSIS — O034 Incomplete spontaneous abortion without complication: Secondary | ICD-10-CM

## 2017-05-30 DIAGNOSIS — O039 Complete or unspecified spontaneous abortion without complication: Secondary | ICD-10-CM

## 2017-05-30 LAB — IGP,RFX APTIMA HPV ALL PTH: PAP Smear Comment: 0

## 2017-05-30 LAB — BETA HCG QUANT (REF LAB): HCG QUANT: 997 m[IU]/mL

## 2017-05-30 LAB — ABO/RH: Rh Factor: POSITIVE

## 2017-05-30 NOTE — Telephone Encounter (Signed)
Patient called and advised of lab results: beta HCG 997 (down from 2165) and blood type AB POS.  Rhogam not indicated. Advised patient to repeat beta HCG in 1 week.

## 2017-06-06 LAB — MTHFR DNA ANALYSIS

## 2017-06-07 ENCOUNTER — Telehealth: Payer: Self-pay | Admitting: Certified Nurse Midwife

## 2017-06-07 DIAGNOSIS — Z1589 Genetic susceptibility to other disease: Secondary | ICD-10-CM

## 2017-06-07 DIAGNOSIS — E7212 Methylenetetrahydrofolate reductase deficiency: Secondary | ICD-10-CM

## 2017-06-07 NOTE — Telephone Encounter (Signed)
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 Will get fasting homocysteine level tomorrow when she repeats her beta HCG level. Currently on fish oil, folic acid 3 mgm and vitamin B12 daily. Also recommended perinatology consult after the homocysteine results are in. Patient wants the results of the MTHFR sent to her primary care in [facility], Dr Dennard Schaumann. Dalia Heading, CNM

## 2017-06-08 ENCOUNTER — Other Ambulatory Visit: Payer: Managed Care, Other (non HMO)

## 2017-06-08 DIAGNOSIS — Z1589 Genetic susceptibility to other disease: Secondary | ICD-10-CM

## 2017-06-08 DIAGNOSIS — O034 Incomplete spontaneous abortion without complication: Secondary | ICD-10-CM

## 2017-06-08 DIAGNOSIS — E7212 Methylenetetrahydrofolate reductase deficiency: Secondary | ICD-10-CM

## 2017-06-09 LAB — BETA HCG QUANT (REF LAB): hCG Quant: 205 m[IU]/mL

## 2017-06-09 LAB — HOMOCYSTEINE: Homocysteine: 8.2 umol/L (ref 0.0–15.0)

## 2017-06-12 ENCOUNTER — Ambulatory Visit: Payer: Managed Care, Other (non HMO) | Admitting: Family Medicine

## 2017-06-12 ENCOUNTER — Encounter: Payer: Self-pay | Admitting: Family Medicine

## 2017-06-12 VITALS — BP 130/82 | HR 84 | Temp 98.2°F | Resp 16 | Ht 66.5 in | Wt 281.0 lb

## 2017-06-12 DIAGNOSIS — R5383 Other fatigue: Secondary | ICD-10-CM

## 2017-06-12 LAB — CBC WITH DIFFERENTIAL/PLATELET
BASOS PCT: 0.4 %
Basophils Absolute: 34 cells/uL (ref 0–200)
EOS ABS: 302 {cells}/uL (ref 15–500)
EOS PCT: 3.6 %
HCT: 39.7 % (ref 35.0–45.0)
Hemoglobin: 13.3 g/dL (ref 11.7–15.5)
Lymphs Abs: 3301 cells/uL (ref 850–3900)
MCH: 29.2 pg (ref 27.0–33.0)
MCHC: 33.5 g/dL (ref 32.0–36.0)
MCV: 87.1 fL (ref 80.0–100.0)
MONOS PCT: 9.7 %
MPV: 9.9 fL (ref 7.5–12.5)
Neutro Abs: 3948 cells/uL (ref 1500–7800)
Neutrophils Relative %: 47 %
PLATELETS: 310 10*3/uL (ref 140–400)
RBC: 4.56 10*6/uL (ref 3.80–5.10)
RDW: 13.2 % (ref 11.0–15.0)
TOTAL LYMPHOCYTE: 39.3 %
WBC mixed population: 815 cells/uL (ref 200–950)
WBC: 8.4 10*3/uL (ref 3.8–10.8)

## 2017-06-12 LAB — COMPLETE METABOLIC PANEL WITH GFR
AG Ratio: 1.8 (calc) (ref 1.0–2.5)
ALKALINE PHOSPHATASE (APISO): 105 U/L (ref 33–115)
ALT: 17 U/L (ref 6–29)
AST: 11 U/L (ref 10–30)
Albumin: 4.1 g/dL (ref 3.6–5.1)
BILIRUBIN TOTAL: 0.4 mg/dL (ref 0.2–1.2)
BUN: 12 mg/dL (ref 7–25)
CHLORIDE: 103 mmol/L (ref 98–110)
CO2: 26 mmol/L (ref 20–32)
Calcium: 9.2 mg/dL (ref 8.6–10.2)
Creat: 0.84 mg/dL (ref 0.50–1.10)
GFR, Est African American: 101 mL/min/{1.73_m2} (ref >=60)
GFR, Est Non African American: 88 mL/min/{1.73_m2} (ref >=60)
GLUCOSE: 84 mg/dL (ref 65–99)
Globulin: 2.3 g/dL (calc) (ref 1.9–3.7)
Potassium: 4.2 mmol/L (ref 3.5–5.3)
Sodium: 137 mmol/L (ref 135–146)
TOTAL PROTEIN: 6.4 g/dL (ref 6.1–8.1)

## 2017-06-12 LAB — TSH: TSH: 3.77 m[IU]/L

## 2017-06-12 NOTE — Progress Notes (Signed)
Subjective:    Patient ID: Belinda Martin, female    DOB: 1976-01-31, 42 y.o.   MRN: [id number]  HPI Unfortunately, the patient suffered a miscarriage in February. As part of her workup, her gynecologist perform genetic testing and the patient was found to be homozygous for the MTHFR gene mutation putting her at risk for elevated levels of homocystine. Her plasma homocystine level however was normal at 8. Family history is concerning for a father who has a history of thromboembolism and is on chronic anticoagulation for this. He also has a history of heart disease. To date, the patient has not suffered a DVT or pulmonary embolism. She has no history of stroke, TIA, or heart disease. She has however suffered a miscarriage. She has also suffered heavy bleeding since the miscarriage and reports fatigue and would like her labs checked for anemia. Past Medical History:  Diagnosis Date  . Asthma    Allergic  . Chest pain   . DDD (degenerative disc disease), lumbar   . Depression    as adolescent  . Fatigue   . GERD (gastroesophageal reflux disease)   . Hair loss    excessive  . History of abnormal cervical Pap smear 2009   ASCUS with positive HRHPV; colpo:HPV effect  . History of echocardiogram 10/10   EF 65-70%, normal valves  . History of genital warts 1998  . Irritable bowel syndrome   . Obesity   . Pseudotumor cerebri   . 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 echo test  10/10   EF 65-70%, normal valves  . Electrocautery of condyloma accuminata  09/2007   Dr Rayford Halsted  . NASAL SINUS SURGERY    . TONSILLECTOMY     Current Outpatient Medications on File Prior to Visit  Medication Sig Dispense Refill  . albuterol (PROVENTIL HFA;VENTOLIN HFA) 108 (90 Base) MCG/ACT inhaler Inhale 1-2 puffs into the lungs every 6 (six) hours as needed for wheezing or shortness of breath. 18 g 11  . fexofenadine (ALLEGRA) 180 MG tablet  Take 180 mg by mouth daily.    . folic acid (FOLVITE) 1 MG tablet Take 3 mg by mouth daily.    . Glucosamine 500 MG CAPS Take 1,000 mg by mouth daily.    . naproxen (NAPROSYN) 250 MG tablet Take 250 mg by mouth 2 (two) times daily as needed.    . Omega-3 Fatty Acids (FISH OIL) 1000 MG CAPS Take 2 capsules by mouth daily.    . vitamin B-12 (CYANOCOBALAMIN) 500 MCG tablet Take 1,000 mcg by mouth daily.     No current facility-administered medications on file prior to visit.    Allergies  Allergen Reactions  . Amoxicillin     REACTION: hives   Social History   Socioeconomic History  . Marital status: Married    Spouse name: Not on file  . Number of children: 0  . Years of education: Not on file  . Highest education level: Not on file  Social Needs  . Financial resource strain: Not on file  . Food insecurity - worry: Not on file  . Food insecurity - inability: Not on file  . Transportation needs - medical: Not on file  . Transportation needs - non-medical: Not on file  Occupational History  . Occupation: Mental health assessments    Comment: [employer]  Tobacco Use  . Smoking status: Never Smoker  .  Smokeless tobacco: Never Used  Substance and Sexual Activity  . Alcohol use: No  . Drug use: No  . Sexual activity: Yes    Partners: Male    Birth control/protection: None  Other Topics Concern  . 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       Review of Systems  All other systems reviewed and are negative.      Objective:   Physical Exam  Constitutional: She appears well-developed and well-nourished.  Cardiovascular: Normal rate, regular rhythm and normal heart sounds.  Pulmonary/Chest: Effort normal and breath sounds normal.  Vitals reviewed.         Assessment & Plan:  Fatigue, unspecified type - Plan: CBC with Differential/Platelet, COMPLETE METABOLIC  PANEL WITH GFR, TSH, Cortisol  Based on research I conducted today during our office visit, there is no long-term recommendation for patient homozygous for this gene. I do not see harm in having the patient take 1 mg of folic acid every day and 1000 g of vitamin B12 every day although there is very little evidence that this helps prevent thromboembolism, stroke, or miscarriage. However I believe that the risk-benefit ratio is such that this is a benign and possibly beneficial measure.  I would not recommend anticoagulation. I can find no recommendation regarding antiplatelet agent.  Given her fatigue, I will check a CBC, TSH, cortisol level, and a CMP

## 2017-06-13 ENCOUNTER — Encounter: Payer: Self-pay | Admitting: Certified Nurse Midwife

## 2017-06-13 ENCOUNTER — Telehealth: Payer: Self-pay | Admitting: Certified Nurse Midwife

## 2017-06-13 DIAGNOSIS — E7212 Methylenetetrahydrofolate reductase deficiency: Secondary | ICD-10-CM | POA: Insufficient documentation

## 2017-06-13 DIAGNOSIS — Z1589 Genetic susceptibility to other disease: Secondary | ICD-10-CM | POA: Insufficient documentation

## 2017-06-13 DIAGNOSIS — O034 Incomplete spontaneous abortion without complication: Secondary | ICD-10-CM

## 2017-06-13 LAB — CORTISOL: CORTISOL PLASMA: 6.2 ug/dL

## 2017-06-13 NOTE — Telephone Encounter (Signed)
Patient called with beta HCG results: beta dropped to 205 from 997. Will need to repeat in 1-2 weeks. Homocysteine level was normal. Will consult [facility] for preconceptual counseling regarding being homozygous for MTHFR in the event of another pregnancy.

## 2017-06-22 ENCOUNTER — Ambulatory Visit
Admission: RE | Admit: 2017-06-22 | Discharge: 2017-06-22 | Disposition: A | Discharge status: Home / Self Care | Payer: 59 | Source: Ambulatory Visit | Attending: Maternal & Fetal Medicine | Admitting: Maternal & Fetal Medicine

## 2017-06-22 VITALS — BP 124/64 | HR 87 | Temp 97.9°F | Resp 18 | Wt 279.0 lb

## 2017-06-22 DIAGNOSIS — O09519 Supervision of elderly primigravida, unspecified trimester: Secondary | ICD-10-CM

## 2017-06-22 DIAGNOSIS — E7212 Methylenetetrahydrofolate reductase deficiency: Secondary | ICD-10-CM

## 2017-06-22 NOTE — Progress Notes (Signed)
Referring physician:  [facility] Length of consultation:  50 minutes  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  She also has a history of infertility, one recent miscarriage and is 42 years old.    We first obtained a detailed family history and pregnancy history.  [name] is 42 years old, G1P0010.  After years of infertility, she and her husband conceived spontaneously earlier this year.  She began bleeding and had a miscarriage in mid February at 10 weeks.  She reports that her OB is following her hCG levels currently. She is taking a multivitamin, vitamin X73 and Folic acid supplements along with an 81 mg ASA daily.  In the family history, her brother and his wife have had three first trimester miscarriages, but no details about causes or possible testing are known.  [name]' father was found to be homozygous for MTHFR variants following a DVT.  There are no other reported family members with recurrent miscarriages and no persons with clotting disorders.  The patient also reported that her mothers half brother had developmental delays.  He passed away due to complications from a medication, but the cause for his delays is not known.  The family believes it was related to the fact that this parents were first cousins.  We reviewed recessive inheritance and consanguinity, however, to determine the chance for this pregnancy or other family members to have a similar condition, one must first determine the cause for the condition in the affected family member.  When the specific cause is not known, this risk assessment is much more difficult.  Among the inherited causes for developmental delays are single gene disorders, chromosome conditions, numerous syndromes and multifactorial conditions.  Each of these may have a different recurrence risk within a family.  We encouraged them to speak with the family and find out  if any genetic testing, including Fragile X testing, may have been performed.  We are happy to review medical records or speak further about testing options if desired. Lastly, she stated that her mother has been diagnosed with benign essential body tremor.  [name] has spoken with her mother's neurologist who indicated that this condition may be inherited.  It appears that benign essential tremors occur sporadically in some individuals but also appear to be inherited in other families.  The inheritance may be dominant, meaning that if a parent has a gene change to cause the condition, then each of their children has a 50% chance for inheriting that gene change.  It is also possible for some families with this condition to show reduced penetrance meaning that not everyone with the gene change would necessarily develop symptoms.  Our understanding of the causes for benign essential tremors is not complete at this time.  The remainder of the family history is unremarkable for developmental delays, birth differences or known genetic conditions.  We then discussed the MTHFR genetics in detail.  MTHFR (5,10-methylenetetrahydrofolate reductase) is a gene found within every cell in every person. Genes are the instructions, or blueprints, that tell our body how to grow and develop. Each person has two copies of most of our genes, one inherited from the mother and the other from the father. The MTHFR gene produces an enzyme that plays a role in processing amino acids, which are the building blocks of proteins. Specifically, the methylenetetrahydrofolate reductase enzyme is important for the chemical reaction involving  the B-vitamin folate (also called folic acid or vitamin B). The enzyme is responsible for a multi-step process that converts the amino acid homocysteine to another amino acid, methionine. The body uses methionine to make proteins and other important compounds. There are two genetic changes in the MTHFR gene  that do not result in a mutation per se, but can alter the function of the protein. These two common MTHFR gene polymorphisms (benign genetic changes not associated with disease that represent normal variant in the population) occur at specific locations called "positions" along the gene. One common polymorphism occurs at position 677. At this location, one amino acid base pair is different, in that Cytosine is replaced by Thymine. This polymorphism is thus called C677T; it is sometimes referred to as the "thermolabile" variant. Another common polymorphism in the MTHFR gene is at position 1298. At this location, Adenine is replaced by Cytosine and is therefore called A1298C.   Approximately 1 in 3 individuals has one of these two polymorphisms. The two MTHFR polymorphisms can be "heterozygous" meaning they occur only on one copy or "allele" of the MTHFR gene, or they can be "homozygous", occurring on both alleles. Individuals can also be "compound heterozygote" when one allele has the C677T polymorphism and the other allele contains the A1298C polymorphism. The frequency of a heterozygous C677T polymorphism is common, occurring in about 35% of the population. About 10-15% of the Caucasian population is homozygous for the C677T polymorphism. Approximately 10% of the population is homozygous for the A1298C polymorphism . Approximately 17% of the population is "compound heterozygous" for the two polymorphisms. Because the frequency of these polymorphisms is so high and because it is involved in the homocysteine pathway, they have been linked to conditions like homocysteinuria, neural tube defects, and venous thromboembolism (VTE). Over the years, the significance of this gene has been implicated in other conditions like autism. Ultimately, there does not appear to be any good evidence that there is any increased risk for any of these conditions when an individual is found to be heterozygous or homozygous for any of  these polymorphisms.  In 2013, the [employer] a practice guideline (Genetics in Medicine 2013 Feb; 15(2): 153-6) stating: "MTHFR polymorphism genotyping should not be ordered as part of the clinical evaluation for thrombophilia or re-current pregnancy loss; MTHFR polymorphism genotyping should not be ordered for at-risk family members; A clinical geneticist who serves as a Optometrist for a patient in whom an MTHFR polymorphism(s) is found should ensure that the patient has received a thorough and appropriate evaluation for his or her symptoms; If the patient is homozygous for the "thermolabile" variant c.677C>T, the geneticist may order a fasting total plasma homocysteine, if not previously ordered, to provide more accurate counseling; MTHFR status does not change the recommendation that women of childbearing age should take the standard dose of folic acid supplementation to reduce the risk of neural tube defects as per the general population guidelines." These guidelines suggest that individuals who have been tested and are homozygous for the C677T variant should be offered a fasting total plasma homocysteine level. There is no evidence C677T homozygotes who have normal plasma homocysteine levels are at increased risk for venous thromboembolism or recurrent pregnancy loss related to their MTHFR status. [name] had homocysteine levels drawn on 06/08/2017 at her OB and those results are within normal range (8.2umol/L - reference range 0.0-15.0). However, C677T homozygotes with elevated homocysteine may have mildly increased risk for venous thromboembolism or  recurrent pregnancy loss. Homozygotes for C677T may have increased risk for having a child with a neural tube defect and there is possibly a correlation with increased risk for stroke but not known for cardiovascular disease in general. There is no evidence daily vitamin B supplementation reduces risks associated with  hyperhomocysteinemia or MTHFR genotype status.  Finally, we reviewed causes for pregnancy loss but explained that with one loss, a workup for recurrent pregnancy loss is not warranted at this time.  We also reviewed the increased risk for chromosome conditions with increasing maternal age.  If [name] and her husband are interested in pursuing another pregnancy, we would encourage them to meet with a reproductive endocrinologist in the very near future.   [name] was given our information to contact our office with any questions or concerns.  We may be reached at [phone].   Wilburt Finlay, MS, CGC

## 2017-06-22 NOTE — Progress Notes (Signed)
Pt. Seen by me, agree with impression and plan outlined in [facility] note.   She has a previous preconception consultation with Dr. Lehman Prom, I agree with those recommendations.  I did recommend she pursue further evaluation with Reproductive Endocrinology as they could help her to optimize her likelihood of pregnancy. She is going to discuss further with her husband.    We briefly discussed perinatal  aspirin use--I reviewed the use of aspirin after 12 weeks for preeclampsia risk reduction.  This therapy would be recommended for her during pregnancy.

## 2017-06-30 ENCOUNTER — Telehealth: Payer: Self-pay | Admitting: Certified Nurse Midwife

## 2017-06-30 ENCOUNTER — Other Ambulatory Visit: Payer: Managed Care, Other (non HMO)

## 2017-06-30 DIAGNOSIS — O034 Incomplete spontaneous abortion without complication: Secondary | ICD-10-CM

## 2017-06-30 NOTE — Telephone Encounter (Signed)
Patient has been recommended to see a reproductive endocrinologist and was wondering if Jaclyn Shaggy would be able to refer one for her?  She was told [facility] had one but was in [place] and that would be inconvenient, if something closer to [place] if possible.

## 2017-07-01 LAB — BETA HCG QUANT (REF LAB): HCG QUANT: 8 m[IU]/mL

## 2017-07-07 ENCOUNTER — Other Ambulatory Visit: Payer: Self-pay | Admitting: Certified Nurse Midwife

## 2017-07-07 DIAGNOSIS — N979 Female infertility, unspecified: Secondary | ICD-10-CM

## 2017-07-07 NOTE — Telephone Encounter (Signed)
Talked with patient. Will refer to [facility] in [place].

## 2017-07-07 NOTE — Progress Notes (Signed)
Patient requests referral to reproductive endocrinologist. Will refer to [facility] in [place]. [employer]

## 2018-01-29 ENCOUNTER — Ambulatory Visit (INDEPENDENT_AMBULATORY_CARE_PROVIDER_SITE_OTHER): Payer: Managed Care, Other (non HMO)

## 2018-01-29 DIAGNOSIS — Z23 Encounter for immunization: Secondary | ICD-10-CM

## 2018-01-29 NOTE — Progress Notes (Signed)
Patient was in office for flu vaccine.Patient received vaccine in her left deltoid. Patient tolerated well.

## 2018-01-30 ENCOUNTER — Telehealth: Payer: Self-pay | Admitting: Family Medicine

## 2018-01-30 NOTE — Telephone Encounter (Signed)
Patient called in stating that she received her flu vaccine and on yesterday and starting last night she developed pain under her left arm pit. She informed me that the site is not hurting but just under her arm.She stated that she is still able to move her arm as normal but it just hurts. Patient also informed me that she had this reaction before back in 2016 and she came in to see Dr.Pickard. Reviewed that office note from 12/30/2014 and informed patient to take over the counter ibuprofen and try warm compresses. If symptoms do not resolve give [place] a call and we can schedule for an office visit. Patient verbalized understanding.

## 2018-04-11 ENCOUNTER — Other Ambulatory Visit: Payer: Self-pay

## 2018-04-11 ENCOUNTER — Encounter: Payer: Self-pay | Admitting: Family Medicine

## 2018-04-11 ENCOUNTER — Ambulatory Visit: Payer: Managed Care, Other (non HMO) | Admitting: Family Medicine

## 2018-04-11 VITALS — BP 126/70 | HR 80 | Temp 98.7°F | Resp 12 | Ht 66.5 in | Wt 288.0 lb

## 2018-04-11 DIAGNOSIS — R002 Palpitations: Secondary | ICD-10-CM | POA: Diagnosis not present

## 2018-04-11 DIAGNOSIS — Z6841 Body Mass Index (BMI) 40.0 and over, adult: Secondary | ICD-10-CM

## 2018-04-11 LAB — TSH: TSH: 3.27 m[IU]/L

## 2018-04-11 LAB — CBC WITH DIFFERENTIAL/PLATELET
Absolute Monocytes: 1092 cells/uL — ABNORMAL HIGH (ref 200–950)
BASOS ABS: 41 {cells}/uL (ref 0–200)
Basophils Relative: 0.4 %
EOS PCT: 1.2 %
Eosinophils Absolute: 124 cells/uL (ref 15–500)
HEMATOCRIT: 40.7 % (ref 35.0–45.0)
Hemoglobin: 13.9 g/dL (ref 11.7–15.5)
LYMPHS ABS: 3821 {cells}/uL (ref 850–3900)
MCH: 29.9 pg (ref 27.0–33.0)
MCHC: 34.2 g/dL (ref 32.0–36.0)
MCV: 87.5 fL (ref 80.0–100.0)
MPV: 9.6 fL (ref 7.5–12.5)
Monocytes Relative: 10.6 %
NEUTROS PCT: 50.7 %
Neutro Abs: 5222 cells/uL (ref 1500–7800)
PLATELETS: 345 10*3/uL (ref 140–400)
RBC: 4.65 10*6/uL (ref 3.80–5.10)
RDW: 13.6 % (ref 11.0–15.0)
Total Lymphocyte: 37.1 %
WBC: 10.3 10*3/uL (ref 3.8–10.8)

## 2018-04-11 LAB — COMPREHENSIVE METABOLIC PANEL
AG Ratio: 1.6 (calc) (ref 1.0–2.5)
ALT: 27 U/L (ref 6–29)
AST: 20 U/L (ref 10–30)
Albumin: 4.1 g/dL (ref 3.6–5.1)
Alkaline phosphatase (APISO): 105 U/L (ref 33–115)
BILIRUBIN TOTAL: 0.6 mg/dL (ref 0.2–1.2)
BUN: 11 mg/dL (ref 7–25)
CALCIUM: 9.2 mg/dL (ref 8.6–10.2)
CO2: 26 mmol/L (ref 20–32)
Chloride: 101 mmol/L (ref 98–110)
Creat: 0.75 mg/dL (ref 0.50–1.10)
Globulin: 2.6 g/dL (calc) (ref 1.9–3.7)
Glucose, Bld: 94 mg/dL (ref 65–99)
Potassium: 3.9 mmol/L (ref 3.5–5.3)
SODIUM: 138 mmol/L (ref 135–146)
TOTAL PROTEIN: 6.7 g/dL (ref 6.1–8.1)

## 2018-04-11 MED ORDER — TIZANIDINE HCL 4 MG PO TABS: 4.0000 mg | ORAL_TABLET | Freq: Four times a day (QID) | ORAL | 1 refills | Status: DC | PRN

## 2018-04-11 NOTE — Progress Notes (Signed)
Subjective:    Patient ID: Belinda Martin, female    DOB: 1975/07/22, 43 y.o.   MRN: [id number]  Patient presents for Palpitations (intermittent racing HR, R hand numbness, tightness in neck and jaw)   Pt here with episodes of palpitations , racing HR, tightness in her neck and jaw intermittanly over the past week or so. Symptoms do not occur at the same time. Last episode of palpitations was 2 nights ago it woke her from sleep  She has gained 26lbs over thepast year, admits to stress eating after her miscarriage around that time, this has been very difficult for her, yet she doesn't feel stress is the cause of her symptoms. She is a mental health provider herself.  Denies any recent illness, has decreased caffiene use, she has started to walk trying to improve her weight and health, she did have have an episode of tingling in her right leg lateral side but that went away after walking, also has had episodes of tingling in her right hand from wrist to thumb area randomly   Review Of Systems:  GEN- denies fatigue, fever, weight loss,weakness, recent illness HEENT- denies eye drainage, change in vision, nasal discharge, CVS- denies chest pain, +palpitations RESP- denies SOB, cough, wheeze ABD- denies N/V, change in stools, abd pain GU- denies dysuria, hematuria, dribbling, incontinence MSK- denies joint pain, muscle aches, injury Neuro- denies headache, dizziness, syncope, seizure activity       Objective:    BP 126/70   Pulse 80   Temp 98.7 F (37.1 C) (Oral)   Resp 12   Ht 5' 6.5" (1.689 m)   Wt 288 lb (130.6 kg)   SpO2 97%   BMI 45.79 kg/m  GEN- NAD, alert and oriented x3 HEENT- PERRL, EOMI, non injected sclera, pink conjunctiva, MMM, oropharynx clear Neck- Supple, no thyromegaly ,neg spurlings,no bruit  CVS- RRR, no murmur RESP-CTAB ABD-NABS,soft,NT,ND Psych- tearful discussing miscarriage, not anxious or overly depressed, good eye contact, well groomed  Neuro-  CNII-XII grossly in tact, sensation in tact upper and lower ext, moving ext equally EXT- No edema Pulses- Radial, DP- 2+  EKG- NSR, no ST changes, low voltage ( morbidly obese)      Assessment & Plan:      Problem List Items Addressed This Visit      Unprioritized   Obesity    Will obtain holter monitor for the palpitations, non currently, no red flags on ekg though in visit states she felt some tightness on ant neck. 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 She has been stressed due to the miscarriage and her increasing weight has taken a toll on her health. Her symptoms may be just stress related but with risk factors or morbid obesity, labs done, monitor per above Advised to go to ER if pain worsened in chest For the neck symptoms will try robaxin, she does not sleep very well either, has known mSK conditions, see if this helps  We did discuss temporarily small dose of benzo but she wants to avoid right now        Other Visit Diagnoses    Palpitations    -  Primary   Relevant Orders   EKG 12-Lead (Completed)   CBC with Differential/Platelet (Completed)   Comprehensive metabolic panel (Completed)   TSH (Completed)      Note: This dictation was prepared with Dragon dictation along with smaller phrase technology. Any transcriptional errors that result from this  process are unintentional.

## 2018-04-11 NOTE — Patient Instructions (Signed)
We will call with lab results F/U pending results  

## 2018-04-12 ENCOUNTER — Encounter: Payer: Self-pay | Admitting: Family Medicine

## 2018-04-12 NOTE — Assessment & Plan Note (Addendum)
Will obtain holter monitor for the palpitations, non currently, no red flags on ekg though in visit states she felt some tightness on ant neck. 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 She has been stressed due to the miscarriage and her increasing weight has taken a toll on her health. Her symptoms may be just stress related but with risk factors or morbid obesity, labs done, monitor per above Advised to go to ER if pain worsened in chest For the neck symptoms will try robaxin, she does not sleep very well either, has known mSK conditions, see if this helps  We did discuss temporarily small dose of benzo but she wants to avoid right now

## 2018-10-12 ENCOUNTER — Other Ambulatory Visit: Payer: Self-pay

## 2018-10-12 ENCOUNTER — Encounter: Payer: Self-pay | Admitting: Family Medicine

## 2018-10-12 ENCOUNTER — Ambulatory Visit: Payer: Managed Care, Other (non HMO) | Admitting: Family Medicine

## 2018-10-12 VITALS — BP 128/78 | HR 86 | Temp 98.9°F | Resp 16 | Ht 66.5 in | Wt 245.0 lb

## 2018-10-12 DIAGNOSIS — F430 Acute stress reaction: Secondary | ICD-10-CM | POA: Diagnosis not present

## 2018-10-12 MED ORDER — CLONAZEPAM 0.5 MG PO TABS: 0.5000 mg | ORAL_TABLET | Freq: Three times a day (TID) | ORAL | 1 refills | Status: DC | PRN

## 2018-10-12 NOTE — Progress Notes (Signed)
Subjective:    Patient ID: TALAYAH PICARDI, female    DOB: 07-Aug-1975, 43 y.o.   MRN: [id number]  HPI Patient presents today with several physical complaints.  Over the last 2 months, her hair started falling out diffusely.  She does have thinning seen symmetrically all throughout the scalp.  There is no patches of alopecia rather there is general thinning.  She also reports palpitations.  These primarily occur at night.  She will feel her heart racing and skipping.  She occasionally gets tightness in the chest when this occurs.  She is also having pain in her right neck.  The pain is constant and is dull aching pain.  There is no pain with rotation of her neck or flexion and extension of her neck.  Around the same time, the patient has found out that she and her husband are getting a divorce.  He was unfaithful.  There have been financial problems as well.  She is under tremendous stress.  She is tearful today in the exam room. 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 (Patient not taking: Reported on 04/11/2018) 18 g 11   fexofenadine (ALLEGRA) 180 MG tablet Take 180 mg by mouth daily.     folic acid (FOLVITE) 1 MG tablet Take 3 mg by mouth daily.     Glucosamine 500 MG CAPS Take 1,000 mg by mouth daily.     naproxen (NAPROSYN) 250 MG tablet Take 250 mg by mouth 2 (two) times daily as needed.     Omega-3 Fatty Acids (FISH OIL) 1000 MG CAPS Take 2 capsules by mouth daily.     tiZANidine (ZANAFLEX) 4 MG tablet Take 1 tablet (4 mg total) by mouth every 6 (six) hours as needed for muscle spasms. 30 tablet 1   vitamin B-12 (CYANOCOBALAMIN) 500 MCG tablet Take 1,000 mcg by mouth daily.     No current facility-administered medications on file prior to visit.    Allergies  Allergen Reactions   Amoxicillin     REACTION: hives   Social History   Socioeconomic History   Marital status: Married    Spouse name: Not on file   Number of children: 0   Years of education: Not on file   Highest education level: Not on file  Occupational History   Occupation: Mental health assessments    Comment: [employer] resource strain: Not on file   Food insecurity    Worry: Not on file    Inability: Not on file   Transportation needs    Medical: Not on file    Non-medical: Not on file  Tobacco Use   Smoking status: Never Smoker   Smokeless tobacco: Never Used  Substance and Sexual Activity   Alcohol use: No   Drug use: No   Sexual activity: Yes    Partners: Male    Birth control/protection: None  Lifestyle   Physical activity    Days per week: 0 days    Minutes per session: 0 min   Stress: To some extent  Relationships   Social connections    Talks on phone: Not on file    Gets together: Not on file    Attends religious service: Not on file    Active member of club or organization: Not on file    Attends meetings of  clubs or organizations: Not on file    Relationship status: Not on file   Intimate partner violence    Fear of current or ex partner: Not on file    Emotionally abused: Not on file    Physically abused: Not on file    Forced sexual activity: Not on file  Other Topics Concern   Not on file  Social History Narrative   Married; lives in [place]   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       Review of Systems  All other systems reviewed and are negative.      Objective:   Physical Exam  Constitutional: She appears well-developed and well-nourished.  Cardiovascular: Normal rate, regular rhythm and normal heart sounds.  Pulmonary/Chest: Effort normal and breath sounds normal.  Vitals reviewed.         Assessment & Plan:  Situational stress  I spent 25 minutes today with the patient.  She is gone through tremendous problems in the last year.  She suffered a miscarriage.  She is now divorcing her husband who is been unfaithful.  This is all in the context of the COVID-19 quarantine and pandemic.  I believe anxiety and stress is likely causing many of her physical symptoms.  I recommended trying Klonopin 0.5 mg p.o. nightly to try to help her sleep and see if this eases some of the "palpitations" at night that she is experiencing.  I feel these may be PVCs due to adrenaline.  Also believe this may help some of the pain in her neck which could be muscle spasms and muscle pain due to tension.  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  I recommended that she try Rogaine over-the-counter.  Recheck via telephone next week.  If no better, consider an x-ray of the cervical spine.  TSH was just checked earlier this year and was normal.  I would consider an event monitor/Holter monitor if the palpitations continue.  Patient is comfortable with this plan

## 2018-10-22 ENCOUNTER — Encounter: Payer: Self-pay | Admitting: Family Medicine

## 2019-02-07 ENCOUNTER — Other Ambulatory Visit: Payer: Self-pay

## 2019-02-08 ENCOUNTER — Encounter: Payer: Self-pay | Admitting: Family Medicine

## 2019-02-08 ENCOUNTER — Ambulatory Visit: Payer: Managed Care, Other (non HMO) | Admitting: Family Medicine

## 2019-02-08 VITALS — BP 120/72 | HR 66 | Temp 98.2°F | Resp 16 | Ht 66.5 in | Wt 248.0 lb

## 2019-02-08 DIAGNOSIS — Z23 Encounter for immunization: Secondary | ICD-10-CM | POA: Diagnosis not present

## 2019-02-08 DIAGNOSIS — R5382 Chronic fatigue, unspecified: Secondary | ICD-10-CM

## 2019-02-08 NOTE — Addendum Note (Signed)
Addended by: Shary Decamp B on: 02/08/2019 01:58 PM   Modules accepted: Orders

## 2019-02-08 NOTE — Progress Notes (Signed)
Subjective:    Patient ID: Belinda Martin, female    DOB: Jan 02, 1976, 43 y.o.   MRN: [id number]  HPI  10/12/18 Patient presents today with several physical complaints.  Over the last 2 months, her hair started falling out diffusely.  She does have thinning seen symmetrically all throughout the scalp.  There is no patches of alopecia rather there is general thinning.  She also reports palpitations.  These primarily occur at night.  She will feel her heart racing and skipping.  She occasionally gets tightness in the chest when this occurs.  She is also having pain in her right neck.  The pain is constant and is dull aching pain.  There is no pain with rotation of her neck or flexion and extension of her neck.  Around the same time, the patient has found out that she and her husband are getting a divorce.  He was unfaithful.  There have been financial problems as well.  She is under tremendous stress.  She is tearful today in the exam room.  At that time, my plan was: I spent 25 minutes today with the patient.  She is gone through tremendous problems in the last year.  She suffered a miscarriage.  She is now divorcing her husband who is been unfaithful.  This is all in the context of the COVID-19 quarantine and pandemic.  I believe anxiety and stress is likely causing many of her physical symptoms.  I recommended trying Klonopin 0.5 mg p.o. nightly to try to help her sleep and see if this eases some of the "palpitations" at night that she is experiencing.  I feel these may be PVCs due to adrenaline.  Also believe this may help some of the pain in her neck which could be muscle spasms and muscle pain due to tension.  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  I recommended that she try Rogaine over-the-counter.  Recheck via telephone next week.  If no better, consider an x-ray of the cervical spine.  TSH was just checked earlier this year and was normal.  I  would consider an event monitor/Holter monitor if the palpitations continue.  Patient is comfortable with this plan  02/08/19 Patient is here today reporting severe fatigue.  She states that she has very little energy.  She goes to bed between 10 and 11:00 every night and sleeps until 7:00.  Therefore she is getting at least 8 hours of sleep.  She is resting well.  When she wakes up, the sheets are hardly disturbed suggesting that she is not tossing and turning in bed at night.  She reports that she had a sleep study several years ago that was completely normal.  There was no evidence of obstructive sleep apnea.  Since that sleep study, she has lost approximately 40 pounds.  Therefore she feels that sleep apnea is unlikely.  However she reports that she just is extremely tired.  She can fall asleep at the drop of a hat.  She is having to take 2 naps during the day.  She is constantly feeling like her eyes are ready to close.  She denies any seizure activity.  She denies any drop attacks.  She denies any hallucinations or delusions or sudden loss of muscle control to suggest narcolepsy.  She denies depression.  She states that she actually feels happier today than she did at her last visit.  She is still  stressful and still having to deal with some of the legal issues after her husband left her.  Her husband has recently moved to [place] with his mistress.  She denies any chest pain shortness of breath or dyspnea on exertion.  However she is still experiencing alopecia Past Medical History:  Diagnosis Date  . Asthma    Allergic  . Chest pain   . DDD (degenerative disc disease), lumbar   . Depression    as adolescent  . Fatigue   . GERD (gastroesophageal reflux disease)   . Hair loss    excessive  . History of abnormal cervical Pap smear 2009   ASCUS with positive HRHPV; colpo:HPV effect  . History of echocardiogram 10/10   EF 65-70%, normal valves  . History of genital warts 1998  . 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 Irritable bowel syndrome   . Obesity   . Pseudotumor cerebri   . 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 echo test  10/10   EF 65-70%, normal valves  . Electrocautery of condyloma accuminata  09/2007   Dr Rayford Halsted  . NASAL SINUS SURGERY    . TONSILLECTOMY     Current Outpatient Medications on File Prior to Visit  Medication Sig Dispense Refill  . Biotin 10 MG TABS Take by mouth.    . clonazePAM (KLONOPIN) 0.5 MG tablet Take 1 tablet (0.5 mg total) by mouth 3 (three) times daily as needed for anxiety. 20 tablet 1  . fexofenadine (ALLEGRA) 180 MG tablet Take 180 mg by mouth daily.    . folic acid (FOLVITE) 1 MG tablet Take 3 mg by mouth daily.    . Glucosamine 500 MG CAPS Take 1,000 mg by mouth daily.    . naproxen (NAPROSYN) 250 MG tablet Take 250 mg by mouth 2 (two) times daily as needed.    . Omega-3 Fatty Acids (FISH OIL) 1000 MG CAPS Take 2 capsules by mouth daily.    [name] tiZANidine (ZANAFLEX) 4 MG tablet Take 1 tablet (4 mg total) by mouth every 6 (six) hours as needed for muscle spasms. 30 tablet 1  . vitamin B-12 (CYANOCOBALAMIN) 500 MCG tablet Take 1,000 mcg by mouth daily.     No current facility-administered medications on file prior to visit.    Allergies  Allergen Reactions  . Amoxicillin     REACTION: hives   Social History   Socioeconomic History  . Marital status: Legally Separated    Spouse name: Not on file  . Number of children: 0  . Years of education: Not on file  . Highest education level: Not on file  Occupational History  . Occupation: Mental health assessments    Comment: [employer]  Social Needs  . Financial resource strain: Not on file  . Food insecurity    Worry: Not on file    Inability: Not on file  . Transportation needs    Medical: Not on file    Non-medical: Not on file  Tobacco Use  . Smoking status: Never Smoker  . Smokeless tobacco:  Never Used  Substance and Sexual Activity  . Alcohol use: No  . Drug use: No  . Sexual activity: Yes    Partners: Male    Birth control/protection: None  Lifestyle  . Physical activity    Days per week: 0 days    Minutes per session: 0 min  .  Stress: To some extent  Relationships  . Social Herbalist on phone: Not on file    Gets together: Not on file    Attends religious service: Not on file    Active member of club or organization: Not on file    Attends meetings of clubs or organizations: Not on file    Relationship status: Not on file  . Intimate partner violence    Fear of current or ex partner: Not on file    Emotionally abused: Not on file    Physically abused: Not on file    Forced sexual activity: Not on file  Other Topics Concern  . Not on file  Social History Narrative   Married; lives in 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       Review of Systems  All other systems reviewed and are negative.      Objective:   Physical Exam  Constitutional: She appears well-developed and well-nourished.  Cardiovascular: Normal rate, regular rhythm and normal heart sounds.  Pulmonary/Chest: Effort normal and breath sounds normal.  Vitals reviewed.         Assessment & Plan:  Chronic fatigue - Plan: CBC with Differential, COMPLETE METABOLIC PANEL WITH GFR, TSH, Vitamin B12  Begin by obtaining requisite lab work including a CBC, CMP, TSH, and vitamin B12.  If labs are normal, I would recommend a sleep study to the patient to reevaluate for obstructive sleep apnea given her hypersomnolence.  If there is no evidence of obstructive sleep apnea, I feel the patient may have chronic fatigue syndrome and may benefit from Provigil or a stimulant.  She denies any depression.  Await the results of her lab work

## 2019-02-09 LAB — CBC WITH DIFFERENTIAL/PLATELET
Absolute Monocytes: 899 cells/uL (ref 200–950)
Basophils Absolute: 36 cells/uL (ref 0–200)
Basophils Relative: 0.4 %
Eosinophils Absolute: 71 cells/uL (ref 15–500)
Eosinophils Relative: 0.8 %
HCT: 41.2 % (ref 35.0–45.0)
Hemoglobin: 13.6 g/dL (ref 11.7–15.5)
Lymphs Abs: 3542 cells/uL (ref 850–3900)
MCH: 29.7 pg (ref 27.0–33.0)
MCHC: 33 g/dL (ref 32.0–36.0)
MCV: 90 fL (ref 80.0–100.0)
MPV: 9.8 fL (ref 7.5–12.5)
Monocytes Relative: 10.1 %
Neutro Abs: 4352 cells/uL (ref 1500–7800)
Neutrophils Relative %: 48.9 %
Platelets: 301 10*3/uL (ref 140–400)
RBC: 4.58 10*6/uL (ref 3.80–5.10)
RDW: 13.4 % (ref 11.0–15.0)
Total Lymphocyte: 39.8 %
WBC: 8.9 10*3/uL (ref 3.8–10.8)

## 2019-02-09 LAB — COMPLETE METABOLIC PANEL WITH GFR
AG Ratio: 1.8 (calc) (ref 1.0–2.5)
ALT: 18 U/L (ref 6–29)
AST: 16 U/L (ref 10–30)
Albumin: 4.3 g/dL (ref 3.6–5.1)
Alkaline phosphatase (APISO): 95 U/L (ref 31–125)
BUN: 14 mg/dL (ref 7–25)
CO2: 27 mmol/L (ref 20–32)
Calcium: 9.8 mg/dL (ref 8.6–10.2)
Chloride: 103 mmol/L (ref 98–110)
Creat: 0.81 mg/dL (ref 0.50–1.10)
GFR, Est African American: 105 mL/min/{1.73_m2} (ref >=60)
GFR, Est Non African American: 90 mL/min/{1.73_m2} (ref >=60)
Globulin: 2.4 g/dL (calc) (ref 1.9–3.7)
Glucose, Bld: 90 mg/dL (ref 65–99)
Potassium: 4.4 mmol/L (ref 3.5–5.3)
Sodium: 139 mmol/L (ref 135–146)
Total Bilirubin: 0.5 mg/dL (ref 0.2–1.2)
Total Protein: 6.7 g/dL (ref 6.1–8.1)

## 2019-02-09 LAB — TSH: TSH: 3.54 mIU/L

## 2019-02-09 LAB — VITAMIN B12: Vitamin B-12: 590 pg/mL (ref 200–1100)

## 2019-02-12 MED ORDER — AMPHETAMINE-DEXTROAMPHET ER 15 MG PO CP24: 15.0000 mg | ORAL_CAPSULE | ORAL | 0 refills | Status: DC

## 2019-03-14 ENCOUNTER — Encounter: Payer: Self-pay | Admitting: Family Medicine

## 2019-03-14 MED ORDER — AMPHETAMINE-DEXTROAMPHET ER 15 MG PO CP24: 15.0000 mg | ORAL_CAPSULE | ORAL | 0 refills | Status: DC

## 2019-03-14 NOTE — Telephone Encounter (Signed)
Ok to refill??  Last office visit 02/08/2019.  Last refill 02/12/2019.

## 2019-04-09 ENCOUNTER — Other Ambulatory Visit: Payer: Self-pay | Admitting: Physical Medicine and Rehabilitation

## 2019-04-09 DIAGNOSIS — M542 Cervicalgia: Secondary | ICD-10-CM

## 2019-04-09 DIAGNOSIS — M549 Dorsalgia, unspecified: Secondary | ICD-10-CM

## 2019-04-15 ENCOUNTER — Other Ambulatory Visit: Payer: Self-pay | Admitting: Family Medicine

## 2019-04-15 NOTE — Telephone Encounter (Signed)
Ok to refill??  Last office visit 02/08/2019.  Last refill 03/14/2019.

## 2019-04-16 MED ORDER — AMPHETAMINE-DEXTROAMPHET ER 15 MG PO CP24: 15.0000 mg | ORAL_CAPSULE | ORAL | 0 refills | Status: DC

## 2019-05-01 ENCOUNTER — Ambulatory Visit
Admission: RE | Admit: 2019-05-01 | Discharge: 2019-05-01 | Disposition: A | Discharge status: Home / Self Care | Payer: BC Managed Care – PPO | Source: Ambulatory Visit | Attending: Physical Medicine and Rehabilitation | Admitting: Physical Medicine and Rehabilitation

## 2019-05-01 DIAGNOSIS — M546 Pain in thoracic spine: Secondary | ICD-10-CM | POA: Diagnosis not present

## 2019-05-01 DIAGNOSIS — M542 Cervicalgia: Secondary | ICD-10-CM

## 2019-05-01 DIAGNOSIS — M549 Dorsalgia, unspecified: Secondary | ICD-10-CM

## 2019-05-07 DIAGNOSIS — M546 Pain in thoracic spine: Secondary | ICD-10-CM | POA: Diagnosis not present

## 2019-05-07 DIAGNOSIS — M542 Cervicalgia: Secondary | ICD-10-CM | POA: Diagnosis not present

## 2019-05-07 DIAGNOSIS — G5603 Carpal tunnel syndrome, bilateral upper limbs: Secondary | ICD-10-CM | POA: Diagnosis not present

## 2019-05-07 DIAGNOSIS — M5134 Other intervertebral disc degeneration, thoracic region: Secondary | ICD-10-CM | POA: Diagnosis not present

## 2019-06-06 ENCOUNTER — Ambulatory Visit (INDEPENDENT_AMBULATORY_CARE_PROVIDER_SITE_OTHER): Payer: BC Managed Care – PPO | Admitting: Family Medicine

## 2019-06-06 ENCOUNTER — Encounter: Payer: Self-pay | Admitting: Family Medicine

## 2019-06-06 ENCOUNTER — Other Ambulatory Visit: Payer: Self-pay

## 2019-06-06 VITALS — BP 120/68 | HR 70 | Temp 97.4°F | Resp 18 | Ht 66.5 in | Wt 226.0 lb

## 2019-06-06 DIAGNOSIS — S0003XA Contusion of scalp, initial encounter: Secondary | ICD-10-CM | POA: Diagnosis not present

## 2019-06-06 NOTE — Progress Notes (Signed)
Subjective:    Patient ID: Belinda Martin, female    DOB: 07/22/1975, 44 y.o.   MRN: [id number]  HPI  Last Friday, the patient was putting her shoes on her shoe rack.  When she lifted her head she struck her left temple on the shelf of the shoe rack.  She did not lose consciousness although she briefly "saw stars".  Since that time she has had a dull pressure-like headache in that area.  She denies any nausea or vomiting.  She denies any dizziness or vertigo.  She denies any pulsatile headache.  She has no evidence of a neurologic deficit.  Pupils are equal round reactive to light.  There is no papilledema on funduscopic exam.  Cranial nerves II through XII are grossly intact and muscle strength is 5/5 equal and symmetric in the upper and lower extremities.  She has normal Romberg testing and normal cerebellar testing.  She is tender to palpation on her scalp in the area of the contusion but there is no palpable defect. Past Medical History:  Diagnosis Date  . Asthma    Allergic  . Chest pain   . DDD (degenerative disc disease), lumbar   . Depression    as adolescent  . Fatigue   . GERD (gastroesophageal reflux disease)   . Hair loss    excessive  . History of abnormal cervical Pap smear 2009   ASCUS with positive HRHPV; colpo:HPV effect  . History of echocardiogram 10/10   EF 65-70%, normal valves  . History of genital warts 1998  . 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 ([place])   . Irritable bowel syndrome   . Obesity   . Pseudotumor cerebri   . 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 echo test  10/10   EF 65-70%, normal valves  . Electrocautery of condyloma accuminata  09/2007   Dr Rayford Halsted  . NASAL SINUS SURGERY    . TONSILLECTOMY     Current Outpatient Medications on File Prior to Visit  Medication Sig Dispense Refill  . amphetamine-dextroamphetamine (ADDERALL XR) 15 MG 24 hr capsule Take 1 capsule by mouth every  morning. 30 capsule 0  . Biotin 10 MG TABS Take by mouth.    . clonazePAM (KLONOPIN) 0.5 MG tablet Take 1 tablet (0.5 mg total) by mouth 3 (three) times daily as needed for anxiety. 20 tablet 1  . fexofenadine (ALLEGRA) 180 MG tablet Take 180 mg by mouth daily.    . folic acid (FOLVITE) 1 MG tablet Take 3 mg by mouth daily.    . Glucosamine 500 MG CAPS Take 1,000 mg by mouth daily.    . naproxen (NAPROSYN) 250 MG tablet Take 250 mg by mouth 2 (two) times daily as needed.    . Omega-3 Fatty Acids (FISH OIL) 1000 MG CAPS Take 2 capsules by mouth daily.    [name] tiZANidine (ZANAFLEX) 4 MG tablet Take 1 tablet (4 mg total) by mouth every 6 (six) hours as needed for muscle spasms. 30 tablet 1  . vitamin B-12 (CYANOCOBALAMIN) 500 MCG tablet Take 1,000 mcg by mouth daily.     No current facility-administered medications on file prior to visit.   Allergies  Allergen Reactions  . Amoxicillin     REACTION: hives   Social History   Socioeconomic History  . Marital status: Legally Separated    Spouse name: Not on file  . Number of children:  0  . Years of education: Not on file  . Highest education level: Not on file  Occupational History  . Occupation: Mental health assessments    Comment: [employer]  Tobacco Use  . Smoking status: Never Smoker  . Smokeless tobacco: Never Used  Substance and Sexual Activity  . Alcohol use: No  . Drug use: No  . Sexual activity: Yes    Partners: Male    Birth control/protection: None  Other Topics Concern  . 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    Social Determinants of Health   Financial Resource Strain:   . Difficulty of Paying Living Expenses: Not on file  Food Insecurity:   . Worried About Charity fundraiser in the Last Year: Not on file  . Ran Out of Food in the Last Year: Not on file  Transportation Needs:   . Lack of Transportation  (Medical): Not on file  . Lack of Transportation (Non-Medical): Not on file  Physical Activity:   . Days of Exercise per Week: Not on file  . Minutes of Exercise per Session: Not on file  Stress:   . Feeling of Stress : Not on file  Social Connections:   . Frequency of Communication with Friends and Family: Not on file  . Frequency of Social Gatherings with Friends and Family: Not on file  . Attends Religious Services: Not on file  . Active Member of Clubs or Organizations: Not on file  . Attends Archivist Meetings: Not on file  . Marital Status: Not on file  Intimate Partner Violence:   . Fear of Current or Ex-Partner: Not on file  . Emotionally Abused: Not on file  . Physically Abused: Not on file  . Sexually Abused: Not on file      Review of Systems  All other systems reviewed and are negative.      Objective:   Physical Exam  Constitutional: She is oriented to person, place, and time. She appears well-developed and well-nourished.  HENT:  Head: Normocephalic and atraumatic.    Eyes: Pupils are equal, round, and reactive to light. EOM are normal.  Cardiovascular: Normal rate, regular rhythm and normal heart sounds.  Pulmonary/Chest: Effort normal and breath sounds normal.  Neurological: She is alert and oriented to person, place, and time. She has normal reflexes. She displays normal reflexes. No cranial nerve deficit. She exhibits normal muscle tone. Coordination normal.  Vitals reviewed.         Assessment & Plan:  Contusion of scalp, initial encounter  Patient suffered a contusion to her scalp in the area diagrammed above.  There is no palpable defect to suggest a skull fracture.  There is no neurologic deficit or evidence for increased intracranial pressure.  She has more of been a week out from the trauma.  Therefore I feel no further work-up is necessary.  Patient can treat the headache with ibuprofen.  Anticipate his symptoms should gradually  improve over the next 7 to 10 days.

## 2019-06-09 NOTE — Progress Notes (Signed)
Gynecology Annual Exam  PCP: [name], MD  Chief Complaint:  Chief Complaint  Patient presents with  . Gynecologic Exam    ? mole inside left butt check; bc options    History of Present Illness:Belinda Martin is a 44 year old Caucasian/White female, G1 P0010, who presents for her annual exam. Her menses are usually regular and her last LMP was 06/01/2019. They usually occur every 1 month , they last 4-5 days , are medium flow with 1 heavier day requiring a pad change 2-3 x/day , and are without clots.  She reports dysmenorrhea. She uses ibuprofen with symptomatic relief for the first two days of her menses when "everything feels like it's going to fall out."  The patient's past medical history is notable for a history of obesity, homozygousity for the MTFR mutation, pseudotumor cerebri, asthma, allergies, DJD, bulging discs at L3-4, L4-5, L5-S1, and a sleeping disorder due to excessive limb motion.  Since her last annual GYN exam dated 05/29/2017, she has separated from her husband and has lost  55#. She has also been prescribed Adderall for chronic fatigue.  She is not currently sexually active. She has not used contraception in 7-8 years. Is wanting to discuss contraceptive options Her most recent pap smear was obtained 05/29/2017 and was NIL. Her most recent mammogram obtained on 03/12/2014 was normal.  There is no family history of breast cancer.  There is no family history of ovarian cancer.  The patient does do monthly self breast exams.  The patient does not smoke.  The patient does drink occasionally The patient does not use illegal drugs.  The patient is exercising regularly She had a recent cholesterol screen in 2017 that she thinks was normal.     Review of Systems: Review of Systems  Constitutional: Positive for malaise/fatigue. Negative for chills, fever and weight loss.  HENT: Negative for congestion, sinus pain and sore throat.   Eyes: Negative for  blurred vision and pain.  Respiratory: Negative for hemoptysis, shortness of breath and wheezing.   Cardiovascular: Negative for chest pain, palpitations and leg swelling.  Gastrointestinal: Negative for abdominal pain, blood in stool, diarrhea, heartburn, nausea and vomiting.  Genitourinary: Negative for dysuria, frequency, hematuria and urgency.       Positive for dysmenorrhea  Musculoskeletal: Positive for back pain. Negative for joint pain and myalgias.  Skin: Negative for itching and rash.  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 Negative for dizziness and headaches.  Endo/Heme/Allergies: Positive for environmental allergies. Negative for polydipsia. Does not bruise/bleed easily.       Negative for hirsutism   Psychiatric/Behavioral: Negative for depression. The patient is not nervous/anxious and does not have insomnia.     Past Medical History:  Past Medical History:  Diagnosis Date  . Asthma    Allergic  . Chest pain   . DDD (degenerative disc disease), lumbar   . Depression    as adolescent  . Fatigue   . GERD (gastroesophageal reflux disease)   . Hair loss    excessive  . History of abnormal cervical Pap smear 2009   ASCUS with positive HRHPV; colpo:HPV effect  . History of echocardiogram 10/10   EF 65-70%, normal valves  . History of genital warts 1998  . 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 ([place])   . Irritable bowel syndrome   . Obesity   . Pseudotumor cerebri   . Sleep disorder    due to excessive limb  movement    Past Surgical History:  Past Surgical History:  Procedure Laterality Date  . echo test  10/10   EF 65-70%, normal valves  . Electrocautery of condyloma accuminata  09/2007   Dr Rayford Halsted  . NASAL SINUS SURGERY    . TONSILLECTOMY      Family History:  Family History  Problem Relation Age of Onset  . Arrhythmia Mother   . Heart murmur Mother   . Endometriosis Mother   . Fibromyalgia Mother   . Irritable bowel syndrome Mother     . Deep vein thrombosis Father        MTHFR homozygous  . Atrial fibrillation Father   . Hypertension Father   . Heart failure Paternal Grandfather        CHF  . Stomach cancer Paternal Grandfather   . Crohn's disease Maternal Grandmother   . Diabetes Maternal Grandmother   . Heart murmur Paternal Uncle   . Coronary artery disease Neg Hx        premature CAD    Social History:  Social History   Socioeconomic History  . Marital status: Legally Separated    Spouse name: Not on file  . Number of children: 0  . Years of education: Not on file  . Highest education level: Not on file  Occupational History  . Occupation: Mental health assessments    Comment: [employer]  Tobacco Use  . Smoking status: Never Smoker  . Smokeless tobacco: Never Used  Substance and Sexual Activity  . Alcohol use: Yes    Comment: rare  . Drug use: No  . Sexual activity: Not Currently    Partners: Male    Birth control/protection: None  Other Topics Concern  . 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    Social Determinants of Health   Financial Resource Strain:   . Difficulty of Paying Living Expenses:   Food Insecurity:   . Worried About Charity fundraiser in the Last Year:   . Arboriculturist in the Last Year:   Transportation Needs:   . Film/video editor (Medical):   [name] Lack of Transportation (Non-Medical):   Physical Activity:   . Days of Exercise per Week:   . Minutes of Exercise per Session:   Stress:   . Feeling of Stress :   Social Connections:   . Frequency of Communication with Friends and Family:   . Frequency of Social Gatherings with Friends and Family:   . Attends Religious Services:   . Active Member of Clubs or Organizations:   . Attends Archivist Meetings:   [name] Marital Status:   Intimate Partner Violence:   . Fear of Current or Ex-Partner:   .  Emotionally Abused:   [name] Physically Abused:   . Sexually Abused:     Allergies:  Allergies  Allergen Reactions  . Amoxicillin     REACTION: hives    Medications:  Current Outpatient Medications:  .  amphetamine-dextroamphetamine (ADDERALL XR) 15 MG 24 hr capsule, Take 1 capsule by mouth every morning., Disp: 30 capsule, Rfl: 0 .  Biotin 10 MG TABS, Take by mouth., Disp: , Rfl:  .  fexofenadine (ALLEGRA) 180 MG tablet, Take 180 mg by mouth daily., Disp: , Rfl:  .  folic acid (FOLVITE) 1 MG tablet, Take 3 mg by mouth daily., Disp: , Rfl:  .  Glucosamine 500 MG CAPS, Take 1,000 mg by mouth daily., Disp: , Rfl:  .  Ibuprofen-Famotidine (DUEXIS) 800-26.6 MG TABS, Take 1 tablet by mouth as needed., Disp: , Rfl:  .  Multiple Vitamins-Calcium (ONE-A-DAY WOMENS FORMULA PO), Take 1 tablet by mouth daily., Disp: , Rfl:  .  Omega-3 Fatty Acids (FISH OIL) 1000 MG CAPS, Take 2 capsules by mouth daily., Disp: , Rfl:  .  tiZANidine (ZANAFLEX) 4 MG tablet, Take 1 tablet (4 mg total) by mouth every 6 (six) hours as needed for muscle spasms., Disp: 30 tablet, Rfl: 1 .  vitamin B-12 (CYANOCOBALAMIN) 500 MCG tablet, Take 1,000 mcg by mouth daily., Disp: , Rfl:  .  clonazePAM (KLONOPIN) 0.5 MG tablet, Take 1 tablet (0.5 mg total) by mouth 3 (three) times daily as needed for anxiety. (Patient not taking: Reported on 06/10/2019), Disp: 20 tablet, Rfl: 1 .  naproxen (NAPROSYN) 250 MG tablet, Take 250 mg by mouth 2 (two) times daily as needed., Disp: , Rfl:   Physical Exam Vitals: BP 112/70   Pulse 60   Ht 5\' 6"  (1.676 m)   Wt 224 lb (101.6 kg)   LMP 06/01/2019 (Exact Date)   BMI 36.15 kg/m   General: WF in NAD HEENT: normocephalic, anicteric Neck: no thyroid enlargement, no palpable nodules, no cervical lymphadenopathy  Pulmonary: No increased work of breathing, CTAB Cardiovascular: RRR, without murmur  Breast: Breast symmetrical, no tenderness, no palpable nodules or masses, no skin or nipple  retraction present, no nipple discharge.  No axillary, infraclavicular or supraclavicular lymphadenopathy. Abdomen: Soft, non-tender, obese, non-distended.  Umbilicus without lesions.  No hepatomegaly or masses palpable. No evidence of hernia. Genitourinary:  External: Normal external female genitalia.  Normal urethral meatus, normal Bartholin's and Skene's glands.    Vagina: Normal vaginal mucosa, no evidence of prolapse.    Cervix: Grossly normal in appearance, no bleeding, non-tender  Uterus: Anteverted, normal size, shape, and consistency, mobile, and non-tender  Adnexa: No adnexal masses, non-tender  Rectal: deferred  Lymphatic: no evidence of inguinal lymphadenopathy Extremities: no edema, erythema, or tenderness Neurologic: Grossly intact Psychiatric: mood appropriate, affect full     Assessment: 44 y.o. G1P0010 for annual gyn exam MTHFR mutation     Plan:   1) Breast cancer screening - recommend monthly self breast exams and  annual screening mammograms.   2) Cervical cancer screening - Pap was done.   3) Contraception - discussed contraception options. Had some past issues with visual changes with headaches. Recommended progestin only methods. Explained pros and cons of POP, IUDs, Nexplanon, Depo, or barrier methods. Would like to try POP. RX for norethindrone 0.35 mgm daily. Given instructions for use, possible side effects and explained the 3 hour rule.  4) Routine healthcare maintenance including cholesterol and diabetes screening managed by PCP . Desired STD screen. GC/CHlamydia/ RPR and HIV testing done.  5) Follow up in 3 months or sooner prn  Dalia Heading, CNM

## 2019-06-10 ENCOUNTER — Ambulatory Visit (INDEPENDENT_AMBULATORY_CARE_PROVIDER_SITE_OTHER): Payer: BC Managed Care – PPO | Admitting: Certified Nurse Midwife

## 2019-06-10 ENCOUNTER — Other Ambulatory Visit (HOSPITAL_COMMUNITY)
Admission: RE | Admit: 2019-06-10 | Discharge: 2019-06-10 | Disposition: A | Discharge status: Home / Self Care | Payer: BC Managed Care – PPO | Source: Ambulatory Visit | Attending: Certified Nurse Midwife | Admitting: Certified Nurse Midwife

## 2019-06-10 ENCOUNTER — Encounter: Payer: Self-pay | Admitting: Certified Nurse Midwife

## 2019-06-10 ENCOUNTER — Other Ambulatory Visit: Payer: Self-pay

## 2019-06-10 VITALS — BP 112/70 | HR 60 | Ht 66.0 in | Wt 224.0 lb

## 2019-06-10 DIAGNOSIS — Z124 Encounter for screening for malignant neoplasm of cervix: Secondary | ICD-10-CM

## 2019-06-10 DIAGNOSIS — Z113 Encounter for screening for infections with a predominantly sexual mode of transmission: Secondary | ICD-10-CM

## 2019-06-10 DIAGNOSIS — Z30011 Encounter for initial prescription of contraceptive pills: Secondary | ICD-10-CM

## 2019-06-10 DIAGNOSIS — Z01419 Encounter for gynecological examination (general) (routine) without abnormal findings: Secondary | ICD-10-CM | POA: Diagnosis not present

## 2019-06-10 DIAGNOSIS — Z1239 Encounter for other screening for malignant neoplasm of breast: Secondary | ICD-10-CM

## 2019-06-10 MED ORDER — NORETHINDRONE 0.35 MG PO TABS: 1.0000 | ORAL_TABLET | Freq: Every day | ORAL | 11 refills | Status: DC

## 2019-06-11 LAB — HIV ANTIBODY (ROUTINE TESTING W REFLEX): HIV Screen 4th Generation wRfx: NONREACTIVE

## 2019-06-11 LAB — RPR QUALITATIVE: RPR Ser Ql: NONREACTIVE

## 2019-06-12 LAB — CYTOLOGY - PAP
Chlamydia: NEGATIVE
Comment: NEGATIVE
Comment: NEGATIVE
Comment: NORMAL
Diagnosis: NEGATIVE
High risk HPV: NEGATIVE
Neisseria Gonorrhea: NEGATIVE

## 2019-06-16 ENCOUNTER — Encounter: Payer: Self-pay | Admitting: Certified Nurse Midwife

## 2019-07-05 ENCOUNTER — Ambulatory Visit
Admission: RE | Admit: 2019-07-05 | Discharge: 2019-07-05 | Disposition: A | Discharge status: Home / Self Care | Payer: BC Managed Care – PPO | Source: Ambulatory Visit | Attending: Certified Nurse Midwife | Admitting: Certified Nurse Midwife

## 2019-07-05 DIAGNOSIS — Z1239 Encounter for other screening for malignant neoplasm of breast: Secondary | ICD-10-CM

## 2019-07-05 DIAGNOSIS — R928 Other abnormal and inconclusive findings on diagnostic imaging of breast: Secondary | ICD-10-CM | POA: Insufficient documentation

## 2019-07-05 DIAGNOSIS — Z1231 Encounter for screening mammogram for malignant neoplasm of breast: Secondary | ICD-10-CM | POA: Diagnosis not present

## 2019-07-08 ENCOUNTER — Other Ambulatory Visit: Payer: Self-pay | Admitting: Certified Nurse Midwife

## 2019-07-08 DIAGNOSIS — N6489 Other specified disorders of breast: Secondary | ICD-10-CM

## 2019-07-08 DIAGNOSIS — R928 Other abnormal and inconclusive findings on diagnostic imaging of breast: Secondary | ICD-10-CM

## 2019-07-09 DIAGNOSIS — M546 Pain in thoracic spine: Secondary | ICD-10-CM | POA: Diagnosis not present

## 2019-07-09 DIAGNOSIS — M545 Low back pain: Secondary | ICD-10-CM | POA: Diagnosis not present

## 2019-07-09 DIAGNOSIS — G5603 Carpal tunnel syndrome, bilateral upper limbs: Secondary | ICD-10-CM | POA: Diagnosis not present

## 2019-07-19 ENCOUNTER — Ambulatory Visit
Admission: RE | Admit: 2019-07-19 | Discharge: 2019-07-19 | Disposition: A | Discharge status: Home / Self Care | Payer: BC Managed Care – PPO | Source: Ambulatory Visit | Attending: Certified Nurse Midwife | Admitting: Certified Nurse Midwife

## 2019-07-19 DIAGNOSIS — R928 Other abnormal and inconclusive findings on diagnostic imaging of breast: Secondary | ICD-10-CM

## 2019-07-19 DIAGNOSIS — N6489 Other specified disorders of breast: Secondary | ICD-10-CM

## 2019-08-16 ENCOUNTER — Encounter: Payer: Self-pay | Admitting: Family Medicine

## 2019-09-09 DIAGNOSIS — M545 Low back pain: Secondary | ICD-10-CM | POA: Diagnosis not present

## 2019-09-09 DIAGNOSIS — M546 Pain in thoracic spine: Secondary | ICD-10-CM | POA: Diagnosis not present

## 2019-09-10 ENCOUNTER — Encounter: Payer: Self-pay | Admitting: Family Medicine

## 2019-09-11 ENCOUNTER — Ambulatory Visit: Payer: BC Managed Care – PPO | Admitting: Certified Nurse Midwife

## 2019-09-13 ENCOUNTER — Ambulatory Visit (INDEPENDENT_AMBULATORY_CARE_PROVIDER_SITE_OTHER): Payer: BC Managed Care – PPO | Admitting: Certified Nurse Midwife

## 2019-09-13 ENCOUNTER — Encounter: Payer: Self-pay | Admitting: Certified Nurse Midwife

## 2019-09-13 ENCOUNTER — Other Ambulatory Visit: Payer: Self-pay

## 2019-09-13 VITALS — BP 110/70 | Ht 66.0 in | Wt 210.0 lb

## 2019-09-13 DIAGNOSIS — Z3041 Encounter for surveillance of contraceptive pills: Secondary | ICD-10-CM

## 2019-09-15 NOTE — Progress Notes (Signed)
  History of Present Illness:  Belinda Martin is a 44 y.o. who was started on norethindrone 0.35 mgm  approximately 3 months ago. Since that time, she states that her menses are regular and last 2 days. She has noticed more breast tenderness and had a mammogram that was [facility] 3.She will have another follow up mammogram in October. She has also noticed some subtle increase in  brown pigmentation on her face/cheeks since starting the POP and recently noticed a wet spot in the right cup of her bra when she was getting undressed. Has not had any further nipple discharge.   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 Also,  has a past surgical history that includes Nasal sinus surgery; echo test (10/10); Tonsillectomy; and Electrocautery of condyloma accuminata (09/2007)., family history includes Arrhythmia in her mother; Atrial fibrillation in her father; Crohn's disease in her maternal grandmother; Deep vein thrombosis in her father; Diabetes in her maternal grandmother; Endometriosis in her mother; Fibromyalgia in her mother; Heart failure in her paternal grandfather; Heart murmur in her mother and paternal uncle; Hypertension in her father; Irritable bowel syndrome in her mother; Stomach cancer in her paternal grandfather.,  reports that she has never smoked. She has never used smokeless tobacco. She reports current alcohol use. She reports that she does not use drugs.  She has a current medication list which includes the following prescription(s): amphetamine-dextroamphetamine, biotin, fexofenadine, folic acid, glucosamine, duexis, multiple  vitamins-calcium, norethindrone, fish oil, tizanidine, vitamin b-12, and clonazepam. Also, is allergic to amoxicillin.  ROS  Physical Exam:  BP 110/70   Ht 5\' 6"  (1.676 m)   Wt 210 lb (95.3 kg)   LMP 09/04/2019 (Approximate)   Breastfeeding No   BMI 33.89 kg/m  Body mass index is 33.89 kg/m. Constitutional: Well nourished, well developed female in no acute distress.  Face: light brown pigmentation of skin on bilateral cheek area Breasts: no masses bilaterally, unable to express any discharge from either nipple Neuro: Grossly intact Psych:  Normal mood and affect.    Assessment: Mild melasma, breast tenderness and ?nipple discharge since starting POP  Plan: She would like to continue the progesterone only pill-has refills for a year Encouraged to let me know if any further nipple discharge-will check prolactin and TSH She was amenable to this plan and will return for annual/PRN.  Dalia Heading, CNM

## 2019-09-16 ENCOUNTER — Other Ambulatory Visit: Payer: Self-pay | Admitting: Family Medicine

## 2019-09-16 ENCOUNTER — Encounter: Payer: Self-pay | Admitting: Family Medicine

## 2019-09-16 MED ORDER — ADDERALL XR 15 MG PO CP24: 15.0000 mg | ORAL_CAPSULE | ORAL | 0 refills | Status: DC

## 2019-10-01 ENCOUNTER — Ambulatory Visit: Payer: BC Managed Care – PPO | Attending: Internal Medicine

## 2019-10-01 DIAGNOSIS — Z23 Encounter for immunization: Secondary | ICD-10-CM

## 2019-10-01 NOTE — Progress Notes (Signed)
   Covid-19 Vaccination Clinic  Name:  Belinda Martin    MRN: [id number] DOB: 10/20/1975  10/01/2019  [name] was observed post Covid-19 immunization for 15 minutes without incident. She was provided with Vaccine Information Sheet and instruction to access the V-Safe system.   [name] was instructed to call 911 with any severe reactions post vaccine: [name] Difficulty breathing  . Swelling of face and throat  . A fast heartbeat  . A bad rash all over body  . Dizziness and weakness   Immunizations Administered    Name Date Dose VIS Date Route   Pfizer COVID-19 Vaccine 10/01/2019  9:59 AM 0.3 mL 05/29/2018 Intramuscular   Manufacturer: [place]   Lot: [id number]   [place]: [id number]

## 2019-10-22 ENCOUNTER — Ambulatory Visit: Payer: BC Managed Care – PPO | Attending: Internal Medicine

## 2019-10-22 DIAGNOSIS — Z23 Encounter for immunization: Secondary | ICD-10-CM

## 2019-10-22 NOTE — Progress Notes (Signed)
   Covid-19 Vaccination Clinic  Name:  Belinda Martin    MRN: [id number] DOB: 05-21-1975  10/22/2019  [name] was observed post Covid-19 immunization for 30 minutes based on pre-vaccination screening without incident. She was provided with Vaccine Information Sheet and instruction to access the V-Safe system.   [name] was instructed to call 911 with any severe reactions post vaccine: [name] Difficulty breathing  . Swelling of face and throat  . A fast heartbeat  . A bad rash all over body  . Dizziness and weakness   Immunizations Administered    Name Date Dose VIS Date Route   Pfizer COVID-19 Vaccine 10/22/2019  9:53 AM 0.3 mL 05/29/2018 Intramuscular   Manufacturer: [place]   Lot: [id number]   [place]: [id number]

## 2019-10-29 DIAGNOSIS — M25552 Pain in left hip: Secondary | ICD-10-CM | POA: Diagnosis not present

## 2019-11-05 DIAGNOSIS — M25552 Pain in left hip: Secondary | ICD-10-CM | POA: Diagnosis not present

## 2019-11-05 DIAGNOSIS — S73102D Unspecified sprain of left hip, subsequent encounter: Secondary | ICD-10-CM | POA: Diagnosis not present

## 2019-11-12 ENCOUNTER — Other Ambulatory Visit: Payer: Self-pay | Admitting: Family Medicine

## 2019-11-12 MED ORDER — ADDERALL XR 15 MG PO CP24: 15.0000 mg | ORAL_CAPSULE | ORAL | 0 refills | Status: DC

## 2019-11-12 NOTE — Telephone Encounter (Signed)
Ok to refill??  Last office visit 06/06/2019.  Last refill 09/16/2019.

## 2019-11-14 DIAGNOSIS — M25552 Pain in left hip: Secondary | ICD-10-CM | POA: Diagnosis not present

## 2019-11-14 DIAGNOSIS — S73102D Unspecified sprain of left hip, subsequent encounter: Secondary | ICD-10-CM | POA: Diagnosis not present

## 2019-12-05 ENCOUNTER — Telehealth: Payer: Self-pay

## 2019-12-05 DIAGNOSIS — R928 Other abnormal and inconclusive findings on diagnostic imaging of breast: Secondary | ICD-10-CM

## 2019-12-05 NOTE — Telephone Encounter (Signed)
Pls notify pt order now placed. Thx

## 2019-12-05 NOTE — Telephone Encounter (Signed)
Pt calling; needs order for 63m mammogram so she can schedule it c [place].  Knows CLG has retired.  [phone]

## 2019-12-05 NOTE — Telephone Encounter (Signed)
Left detailed msg for pt

## 2019-12-24 DIAGNOSIS — M545 Low back pain: Secondary | ICD-10-CM | POA: Diagnosis not present

## 2019-12-24 DIAGNOSIS — M25552 Pain in left hip: Secondary | ICD-10-CM | POA: Diagnosis not present

## 2020-01-10 DIAGNOSIS — Z20822 Contact with and (suspected) exposure to covid-19: Secondary | ICD-10-CM | POA: Diagnosis not present

## 2020-01-14 ENCOUNTER — Ambulatory Visit
Admission: RE | Admit: 2020-01-14 | Discharge: 2020-01-14 | Disposition: A | Discharge status: Home / Self Care | Payer: BC Managed Care – PPO | Source: Ambulatory Visit | Attending: Obstetrics and Gynecology | Admitting: Obstetrics and Gynecology

## 2020-01-14 ENCOUNTER — Encounter: Payer: Self-pay | Admitting: Dermatology

## 2020-01-14 ENCOUNTER — Other Ambulatory Visit: Payer: Self-pay

## 2020-01-14 ENCOUNTER — Ambulatory Visit: Payer: BC Managed Care – PPO | Admitting: Dermatology

## 2020-01-14 DIAGNOSIS — I781 Nevus, non-neoplastic: Secondary | ICD-10-CM | POA: Diagnosis not present

## 2020-01-14 DIAGNOSIS — L811 Chloasma: Secondary | ICD-10-CM | POA: Diagnosis not present

## 2020-01-14 DIAGNOSIS — L309 Dermatitis, unspecified: Secondary | ICD-10-CM

## 2020-01-14 DIAGNOSIS — R928 Other abnormal and inconclusive findings on diagnostic imaging of breast: Secondary | ICD-10-CM | POA: Diagnosis not present

## 2020-01-14 DIAGNOSIS — I8312 Varicose veins of left lower extremity with inflammation: Secondary | ICD-10-CM | POA: Diagnosis not present

## 2020-01-14 DIAGNOSIS — N6489 Other specified disorders of breast: Secondary | ICD-10-CM | POA: Diagnosis not present

## 2020-01-14 DIAGNOSIS — L578 Other skin changes due to chronic exposure to nonionizing radiation: Secondary | ICD-10-CM

## 2020-01-14 MED ORDER — TRIAMCINOLONE ACETONIDE 0.1 % EX CREA: 1.0000 "application " | TOPICAL_CREAM | Freq: Two times a day (BID) | CUTANEOUS | 2 refills | Status: DC | PRN

## 2020-01-14 NOTE — Patient Instructions (Addendum)
Recommend daily broad spectrum sunscreen SPF 30+ to sun-exposed areas, reapply every 2 hours as needed. Call for new or changing lesions.   Recommend taking Heliocare sun protection supplement daily in sunny weather for additional sun protection. For maximum protection on the sunniest days, you can take up to 2 capsules of regular Heliocare OR take 1 capsule of Heliocare Ultra. For prolonged exposure (such as a full day in the sun), you can repeat your dose of the supplement 4 hours after your first dose. Heliocare can be purchased at [facility] or at [email or web address].   Instructions for [facility] Medications  One or more of your medications was sent to the [facility] mail order compounding pharmacy. You will receive an email from them and can purchase the medicine through that link. It will then be mailed to your home at the address you confirmed. If for any reason you do not receive an email from them, please check your spam folder. If you still do not find the email, please let us know or call [facility] at [phone] Stop using after 3 months  Recommend [facility]  Dr. Ronalee Belts or Dr. Lucky Cowboy

## 2020-01-14 NOTE — Progress Notes (Signed)
Follow-Up Visit   Subjective  Belinda Martin is a 44 y.o. female who presents for the following: Follow-up (Melasma and area of concern).  Patient presents today for follow up on Melasma on her face. She has been using sunscreen but no other treatment. It has been present for years but has gotten worse since restarting birth control and going to the beach.  Patient also has an area of concern on her left upper thigh that will throb and itch. Patient has no history of skin cancer.   The following portions of the chart were reviewed this encounter and updated as appropriate:  Tobacco  Allergies  Meds  Problems  Med Hx  Surg Hx  Fam Hx      Review of Systems:  No other skin or systemic complaints except as noted in HPI or Assessment and Plan.  Objective  Well appearing patient in no apparent distress; mood and affect are within normal limits.  A focused examination was performed including face, bilateral arms, bilateral hands, fingernails, bilateral legs.  Relevant physical exam findings are noted in the Assessment and Plan.  Objective  L Cheek > R cheek: Reticulated hyperpigmented patches.   Objective  Left Dorsal Hand: Scaly erythematous papules and plaques  Objective  Left Thigh - Posterior: Small blue veins   Assessment & Plan  Melasma L Cheek > R cheek  Chronic, flared  Recommend sunscreen SPF 45-50 or higher with tint (Recommend Alastin as a good option for her) Recommend Heliocare supplement daily in summer and when on sunny vacations  Start [facility] Hydroquinone 12%/kojic acid/vitamin C cream pea sized amount twice daily to the entire face for up to 3 months. This cannot be used more than 3 months due to risk of exogenous ochronosis (permanent dark spots). The patient was advised this is not covered by insurance. They will receive an email to check out and the medication will be mailed to their home.   Hand dermatitis Left Dorsal Hand  Chronic,  flared  Minimize hand washing, use hand sanitizer, change hand soap to Success. Recommend using Neutrogena norwegian hand cream to damp skin after washing hands  Start TMC 0.1% cream twice a day as needed to affected areas up to 3 weeks for flares  Topical steroids (such as triamcinolone, fluocinolone, fluocinonide, mometasone, clobetasol, halobetasol, betamethasone, hydrocortisone) can cause thinning and lightening of the skin if they are used for too long in the same area. Your physician has selected the right strength medicine for your problem and area affected on the body. Please use your medication only as directed by your physician to prevent side effects.    triamcinolone cream (KENALOG) 0.1 % - Left Dorsal Hand  Varicose veins of left lower extremity with inflammation Left Thigh - Posterior  And reticular veins Recommend seeing vascular surgeon given the larger veins involved  Discussed option of sclerotherapy to smaller varicose veins if vascular surgery not concerning about venous insufficiency of the deeper venous system  Other Related Procedures Ambulatory referral to Vascular Surgery  Spider veins  Other Related Procedures Ambulatory referral to Vascular Surgery   Actinic Damage - hyperpigmented patches at forearms likely actinic damage > melasma. However, she can use the [facility] hydroquinone cream twice a day to these areas as well for up to 3 months to see if she gets some improvement. - diffuse scaly erythematous macules with underlying dyspigmentation - Recommend daily broad spectrum sunscreen SPF 30+ to sun-exposed areas, reapply every 2 hours as  needed.  - Call for new or changing lesions.   Return in about 6 weeks (around 02/25/2020) for Melasma and hand dermatitis.  IDonzetta Kohut, CMA, am acting as scribe for Forest Gleason, MD .  Documentation: I have reviewed the above documentation for accuracy and completeness, and I agree with the  above.  Forest Gleason, MD

## 2020-01-16 ENCOUNTER — Encounter: Payer: Self-pay | Admitting: Obstetrics and Gynecology

## 2020-01-30 DIAGNOSIS — H47333 Pseudopapilledema of optic disc, bilateral: Secondary | ICD-10-CM | POA: Diagnosis not present

## 2020-02-07 ENCOUNTER — Other Ambulatory Visit: Payer: Self-pay | Admitting: Family Medicine

## 2020-02-07 MED ORDER — ADDERALL XR 15 MG PO CP24: 15.0000 mg | ORAL_CAPSULE | ORAL | 0 refills | Status: DC

## 2020-02-07 NOTE — Telephone Encounter (Signed)
Please Advise

## 2020-02-18 ENCOUNTER — Other Ambulatory Visit (INDEPENDENT_AMBULATORY_CARE_PROVIDER_SITE_OTHER): Payer: Self-pay | Admitting: Nurse Practitioner

## 2020-02-18 DIAGNOSIS — I781 Nevus, non-neoplastic: Secondary | ICD-10-CM

## 2020-02-18 DIAGNOSIS — I8312 Varicose veins of left lower extremity with inflammation: Secondary | ICD-10-CM

## 2020-02-19 ENCOUNTER — Ambulatory Visit (INDEPENDENT_AMBULATORY_CARE_PROVIDER_SITE_OTHER): Payer: BC Managed Care – PPO | Admitting: Nurse Practitioner

## 2020-02-19 ENCOUNTER — Encounter (INDEPENDENT_AMBULATORY_CARE_PROVIDER_SITE_OTHER): Payer: Self-pay | Admitting: Nurse Practitioner

## 2020-02-19 ENCOUNTER — Other Ambulatory Visit: Payer: Self-pay

## 2020-02-19 ENCOUNTER — Ambulatory Visit (INDEPENDENT_AMBULATORY_CARE_PROVIDER_SITE_OTHER): Payer: BC Managed Care – PPO

## 2020-02-19 VITALS — BP 111/69 | HR 70 | Ht 67.0 in | Wt 210.0 lb

## 2020-02-19 DIAGNOSIS — I8312 Varicose veins of left lower extremity with inflammation: Secondary | ICD-10-CM

## 2020-02-19 DIAGNOSIS — I781 Nevus, non-neoplastic: Secondary | ICD-10-CM

## 2020-02-23 ENCOUNTER — Encounter (INDEPENDENT_AMBULATORY_CARE_PROVIDER_SITE_OTHER): Payer: Self-pay | Admitting: Nurse Practitioner

## 2020-02-23 NOTE — Progress Notes (Signed)
Subjective:    Patient ID: Belinda Martin, female    DOB: May 28, 1975, 44 y.o.   MRN: [id number] Chief Complaint  Patient presents with  . New Patient (Initial Visit)    [place]. v.V Ble Ven reflux     The patient is seen for evaluation of symptomatic varicose veins.  These varicose veins are located in her left lower extremity with a cluster being near her lateral hip area.  The patient relates burning and stinging which worsened steadily throughout the course of the day, particularly with standing. The patient also notes an aching and throbbing pain over the varicosities, particularly with prolonged dependent positions. The symptoms are significantly improved with elevation.  The patient also notes that during hot weather the symptoms are greatly intensified. The patient states the pain from the varicose veins interferes with work, daily exercise, shopping and household maintenance. At this point, the symptoms are persistent and severe enough that they're having a negative impact on lifestyle and are interfering with daily activities.  There is no history of DVT, PE or superficial thrombophlebitis. There is no history of ulceration or hemorrhage. The patient denies a significant family history of varicose veins.   The patient has not worn graduated compression in the past. At the present time the patient has not been using over-the-counter analgesics. There is no history of prior surgical intervention or sclerotherapy.  No evidence of deep venous insufficiency or superficial venous reflux seen in the left lower extremity.  No evidence of DVT or superficial thrombophlebitis.   Review of Systems  Cardiovascular:       Tender spider veins  All other systems reviewed and are negative.      Objective:   Physical Exam Vitals reviewed.  HENT:     Head: Normocephalic.  Cardiovascular:     Rate and Rhythm: Normal rate.  Pulmonary:     Effort: Pulmonary effort is normal.  Skin:    General:  Skin is warm and dry.     Comments: Scattered spider varicosities bilaterally  Neurological:     Mental Status: She is alert and oriented to person, place, and time.  Psychiatric:        Mood and Affect: Mood normal.        Behavior: Behavior normal.        Thought Content: Thought content normal.        Judgment: Judgment normal.     BP 111/69   Pulse 70   Ht 5\' 7"  (1.702 m)   Wt 210 lb (95.3 kg)   BMI 32.89 kg/m   Past Medical History:  Diagnosis Date  . Asthma    Allergic  . Chest pain   . DDD (degenerative disc disease), lumbar   . Depression    as adolescent  . Fatigue   . GERD (gastroesophageal reflux disease)   . Hair loss    excessive  . History of abnormal cervical Pap smear 2009   ASCUS with positive HRHPV; colpo:HPV effect  . History of echocardiogram 10/10   EF 65-70%, normal valves  . History of genital warts 1998  . 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 Irritable bowel syndrome   . Obesity   . Pseudotumor cerebri   . 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 Marital status: Divorced    Spouse name: Not on file  . Number of children: 0  . Years of  education: Not on file  . Highest education level: Not on file  Occupational History  . Occupation: Mental health assessments    Comment: [employer]  Tobacco Use  . Smoking status: Never Smoker  . Smokeless tobacco: Never Used  Vaping Use  . Vaping Use: Never used  Substance and Sexual Activity  . Alcohol use: Yes    Comment: rare  . Drug use: No  . Sexual activity: Yes    Partners: Male    Birth control/protection: Pill  Other Topics Concern  . 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    Social Determinants of Health   Financial Resource Strain:   . Difficulty of Paying Living Expenses: Not on file  Food Insecurity:   . Worried About  Charity fundraiser in the Last Year: Not on file  . Ran Out of Food in the Last Year: Not on file  Transportation Needs:   . Lack of Transportation (Medical): Not on file  . Lack of Transportation (Non-Medical): Not on file  Physical Activity:   . Days of Exercise per Week: Not on file  . Minutes of Exercise per Session: Not on file  Stress:   . Feeling of Stress : Not on file  Social Connections:   . Frequency of Communication with Friends and Family: Not on file  . Frequency of Social Gatherings with Friends and Family: Not on file  . Attends Religious Services: Not on file  . Active Member of Clubs or Organizations: Not on file  . Attends Archivist Meetings: Not on file  . Marital Status: Not on file  Intimate Partner Violence:   . Fear of Current or Ex-Partner: Not on file  . Emotionally Abused: Not on file  . Physically Abused: Not on file  . Sexually Abused: Not on file    Past Surgical History:  Procedure Laterality Date  . echo test  10/10   EF 65-70%, normal valves  . Electrocautery of condyloma accuminata  09/2007   Dr Rayford Halsted  . NASAL SINUS SURGERY    . TONSILLECTOMY      Family History  Problem Relation Age of Onset  . Arrhythmia Mother   . Heart murmur Mother   . Endometriosis Mother   . Fibromyalgia Mother   . Irritable bowel syndrome Mother   . Deep vein thrombosis Father        MTHFR homozygous  . Atrial fibrillation Father   . Hypertension Father   . Heart failure Paternal Grandfather        CHF  . Stomach cancer Paternal Grandfather   . Crohn's disease Maternal Grandmother   . Diabetes Maternal Grandmother   . Heart murmur Paternal Uncle   . Coronary artery disease Neg Hx        premature CAD  . Breast cancer Neg Hx     Allergies  Allergen Reactions  . Amoxicillin     REACTION: hives    CBC Latest Ref Rng & Units 02/08/2019 04/11/2018 06/12/2017  WBC 3.8 - 10.8 Thousand/uL 8.9 10.3 8.4  Hemoglobin 11.7 - 15.5 g/dL 13.6 13.9  13.3  Hematocrit 35 - 45 % 41.2 40.7 39.7  Platelets 140 - 400 Thousand/uL 301 345 310      CMP     Component Value Date/Time   NA 139 02/08/2019 1219   K 4.4 02/08/2019 1219  CL 103 02/08/2019 1219   CO2 27 02/08/2019 1219   GLUCOSE 90 02/08/2019 1219   BUN 14 02/08/2019 1219   CREATININE 0.81 02/08/2019 1219   CALCIUM 9.8 02/08/2019 1219   PROT 6.7 02/08/2019 1219   AST 16 02/08/2019 1219   ALT 18 02/08/2019 1219   BILITOT 0.5 02/08/2019 1219   GFRNONAA 90 02/08/2019 1219   GFRAA 105 02/08/2019 1219     No results found.     Assessment & Plan:   1. Varicose veins of left lower extremity with inflammation Recommend:  The patient is complaining of varicose veins.    I have had a long discussion with the patient regarding  varicose veins and why they cause symptoms.  Patient will begin wearing graduated compression stockings on a daily basis, beginning first thing in the morning and removing them in the evening. The patient is instructed specifically not to sleep in the stockings.    The patient  will also begin using over-the-counter analgesics such as Motrin 600 mg po TID to help control the symptoms as needed.    In addition, behavioral modification including elevation during the day will be initiated, utilizing a recliner was recommended.  The patient is also instructed to continue exercising such as walking 4-5 times per week.   The Patient will follow up PRN if the symptoms worsen.   Current Outpatient Medications on File Prior to Visit  Medication Sig Dispense Refill  . ADDERALL XR 15 MG 24 hr capsule Take 1 capsule by mouth every morning. Dispense name brand only 30 capsule 0  . Biotin 10 MG TABS Take by mouth.    . fexofenadine (ALLEGRA) 180 MG tablet Take 180 mg by mouth daily.    . folic acid (FOLVITE) 1 MG tablet Take 3 mg by mouth daily.    . Glucosamine 500 MG CAPS Take 1,000 mg by mouth daily.    . Ibuprofen-Famotidine (DUEXIS) 800-26.6 MG TABS  Take 1 tablet by mouth as needed.    . Multiple Vitamins-Calcium (ONE-A-DAY WOMENS FORMULA PO) Take 1 tablet by mouth daily.    . norethindrone (MICRONOR) 0.35 MG tablet Take 1 tablet (0.35 mg total) by mouth daily. 1 Package 11  . Omega-3 Fatty Acids (FISH OIL) 1000 MG CAPS Take 2 capsules by mouth daily.    [name] tiZANidine (ZANAFLEX) 4 MG tablet Take 1 tablet (4 mg total) by mouth every 6 (six) hours as needed for muscle spasms. 30 tablet 1  . triamcinolone cream (KENALOG) 0.1 % Apply 1 application topically 2 (two) times daily as needed. 80 g 2  . vitamin B-12 (CYANOCOBALAMIN) 500 MCG tablet Take 1,000 mcg by mouth daily.    . clonazePAM (KLONOPIN) 0.5 MG tablet Take 1 tablet (0.5 mg total) by mouth 3 (three) times daily as needed for anxiety. (Patient not taking: Reported on 06/10/2019) 20 tablet 1   No current facility-administered medications on file prior to visit.    There are no Patient Instructions on file for this visit. No follow-ups on file.   Kris Hartmann, NP

## 2020-02-25 ENCOUNTER — Ambulatory Visit: Payer: BC Managed Care – PPO | Admitting: Dermatology

## 2020-03-12 DIAGNOSIS — H47333 Pseudopapilledema of optic disc, bilateral: Secondary | ICD-10-CM | POA: Diagnosis not present

## 2020-04-02 DIAGNOSIS — Z20822 Contact with and (suspected) exposure to covid-19: Secondary | ICD-10-CM | POA: Diagnosis not present

## 2020-04-14 ENCOUNTER — Ambulatory Visit: Payer: BC Managed Care – PPO | Admitting: Dermatology

## 2020-04-23 ENCOUNTER — Telehealth: Payer: Self-pay

## 2020-04-23 NOTE — Telephone Encounter (Signed)
I would recommend quarantine 5 days from the onset of symptoms if vaccinated and no additional treatment other than to allow it to run its course unless she is sob.

## 2020-04-23 NOTE — Telephone Encounter (Signed)
Pt was advised [facility] 

## 2020-04-23 NOTE — Telephone Encounter (Signed)
Belinda Martin tested positive for covid today. Had sypmtoms since the first of jan. Pt has been on abx. For sinus infection.  And was told to stay in for five days. Please advised. [place]

## 2020-05-07 ENCOUNTER — Other Ambulatory Visit: Payer: Self-pay

## 2020-05-07 ENCOUNTER — Encounter: Payer: Self-pay | Admitting: Family Medicine

## 2020-05-07 ENCOUNTER — Ambulatory Visit: Payer: BC Managed Care – PPO | Admitting: Family Medicine

## 2020-05-07 VITALS — BP 98/58 | HR 91 | Temp 98.4°F | Ht 66.0 in | Wt 216.0 lb

## 2020-05-07 DIAGNOSIS — R5382 Chronic fatigue, unspecified: Secondary | ICD-10-CM

## 2020-05-07 DIAGNOSIS — G9332 Myalgic encephalomyelitis/chronic fatigue syndrome: Secondary | ICD-10-CM

## 2020-05-07 DIAGNOSIS — R002 Palpitations: Secondary | ICD-10-CM

## 2020-05-07 MED ORDER — MODAFINIL 200 MG PO TABS: 200.0000 mg | ORAL_TABLET | Freq: Every day | ORAL | 0 refills | Status: DC

## 2020-05-07 NOTE — Progress Notes (Signed)
Subjective:    Patient ID: Belinda Martin, female    DOB: 07-Dec-1975, 45 y.o.   MRN: [id number]  HPI  10/12/18 Patient presents today with several physical complaints.  Over the last 2 months, her hair started falling out diffusely.  She does have thinning seen symmetrically all throughout the scalp.  There is no patches of alopecia rather there is general thinning.  She also reports palpitations.  These primarily occur at night.  She will feel her heart racing and skipping.  She occasionally gets tightness in the chest when this occurs.  She is also having pain in her right neck.  The pain is constant and is dull aching pain.  There is no pain with rotation of her neck or flexion and extension of her neck.  Around the same time, the patient has found out that she and her husband are getting a divorce.  He was unfaithful.  There have been financial problems as well.  She is under tremendous stress.  She is tearful today in the exam room.  At that time, my plan was: I spent 25 minutes today with the patient.  She is gone through tremendous problems in the last year.  She suffered a miscarriage.  She is now divorcing her husband who is been unfaithful.  This is all in the context of the COVID-19 quarantine and pandemic.  I believe anxiety and stress is likely causing many of her physical symptoms.  I recommended trying Klonopin 0.5 mg p.o. nightly to try to help her sleep and see if this eases some of the "palpitations" at night that she is experiencing.  I feel these may be PVCs due to adrenaline.  Also believe this may help some of the pain in her neck which could be muscle spasms and muscle pain due to tension.  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  I recommended that she try Rogaine over-the-counter.  Recheck via telephone next week.  If no better, consider an x-ray of the cervical spine.  TSH was just checked earlier this year and was normal.  I  would consider an event monitor/Holter monitor if the palpitations continue.  Patient is comfortable with this plan  02/08/19 Patient is here today reporting severe fatigue.  She states that she has very little energy.  She goes to bed between 10 and 11:00 every night and sleeps until 7:00.  Therefore she is getting at least 8 hours of sleep.  She is resting well.  When she wakes up, the sheets are hardly disturbed suggesting that she is not tossing and turning in bed at night.  She reports that she had a sleep study several years ago that was completely normal.  There was no evidence of obstructive sleep apnea.  Since that sleep study, she has lost approximately 40 pounds.  Therefore she feels that sleep apnea is unlikely.  However she reports that she just is extremely tired.  She can fall asleep at the drop of a hat.  She is having to take 2 naps during the day.  She is constantly feeling like her eyes are ready to close.  She denies any seizure activity.  She denies any drop attacks.  She denies any hallucinations or delusions or sudden loss of muscle control to suggest narcolepsy.  She denies depression.  She states that she actually feels happier today than she did at her last visit.  She is still  stressful and still having to deal with some of the legal issues after her husband left her.  Her husband has recently moved to [place] with his mistress.  She denies any chest pain shortness of breath or dyspnea on exertion.  However she is still experiencing alopecia  At that time, my Maxie Better was: Begin by obtaining requisite lab work including a CBC, CMP, TSH, and vitamin B12.  If labs are normal, I would recommend a sleep study to the patient to reevaluate for obstructive sleep apnea given her hypersomnolence.  If there is no evidence of obstructive sleep apnea, I feel the patient may have chronic fatigue syndrome and may benefit from Provigil or a stimulant.  She denies any depression.  Await the results of her lab  work  05/07/20 Ultimately, the patient started taking Adderall extended release 15 mg a day for chronic fatigue syndrome.  She states that the medication definitely helped.  However she has developed a tolerance to the medication and is losing its effectiveness so she has stopped it.  She states that she continues to suffer from chronic fatigue. Wt Readings from Last 3 Encounters:  05/07/20 216 lb (98 kg)  02/19/20 210 lb (95.3 kg)  09/13/19 210 lb (95.3 kg)   She is working with a Clinical research associate and exercising regularly.  She is also trying to eat healthier.  She is in a new committed relationship.  She states that emotionally and spiritually she is doing better than she ever has however she continues to have hypersomnolence.  She continues to have severe chronic fatigue.  She denies any fevers or chills or chest pain.  She does have palpitations that will frequently wake her up from sleep.  She states that she will wake up suddenly and her heart will be racing.  Is difficult to determine if she is having apneic episodes versus panic attacks versus some type of cardiac arrhythmia.  She is already had a normal sleep study that showed restless limb syndrome/periodic limb movements of sleep disorder.  She had no sleep apnea and she has lost weight since that time.  She denies any panic attacks.  She states that her heart will be racing for no reason but it does occur when she sleeps only. Past Medical History:  Diagnosis Date  . Asthma    Allergic  . Chest pain   . DDD (degenerative disc disease), lumbar   . Depression    as adolescent  . Fatigue   . GERD (gastroesophageal reflux disease)   . Hair loss    excessive  . History of abnormal cervical Pap smear 2009   ASCUS with positive HRHPV; colpo:HPV effect  . History of echocardiogram 10/10   EF 65-70%, normal valves  . History of genital warts 1998  . 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 Irritable bowel syndrome   . Obesity   . Pseudotumor cerebri    . 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 echo test  10/10   EF 65-70%, normal valves  . Electrocautery of condyloma accuminata  09/2007   Dr Rayford Halsted  . NASAL SINUS SURGERY    . TONSILLECTOMY     Current Outpatient Medications on File Prior to Visit  Medication Sig Dispense Refill  . ADDERALL XR 15 MG 24 hr capsule Take 1 capsule by mouth every morning. Dispense name brand only 30 capsule 0  . Biotin 10 MG TABS  Take by mouth.    . fexofenadine (ALLEGRA) 180 MG tablet Take 180 mg by mouth daily.    . folic acid (FOLVITE) 1 MG tablet Take 3 mg by mouth daily.    . Glucosamine 500 MG CAPS Take 1,000 mg by mouth daily.    . Ibuprofen-Famotidine (DUEXIS) 800-26.6 MG TABS Take 1 tablet by mouth as needed.    . Multiple Vitamins-Calcium (ONE-A-DAY WOMENS FORMULA PO) Take 1 tablet by mouth daily.    . norethindrone (MICRONOR) 0.35 MG tablet Take 1 tablet (0.35 mg total) by mouth daily. 1 Package 11  . Omega-3 Fatty Acids (FISH OIL) 1000 MG CAPS Take 2 capsules by mouth daily.    [name] tiZANidine (ZANAFLEX) 4 MG tablet Take 1 tablet (4 mg total) by mouth every 6 (six) hours as needed for muscle spasms. 30 tablet 1  . triamcinolone cream (KENALOG) 0.1 % Apply 1 application topically 2 (two) times daily as needed. 80 g 2  . vitamin B-12 (CYANOCOBALAMIN) 500 MCG tablet Take 1,000 mcg by mouth daily.     No current facility-administered medications on file prior to visit.   Allergies  Allergen Reactions  . Amoxicillin     REACTION: hives   Social History   Socioeconomic History  . Marital status: Divorced    Spouse name: Not on file  . Number of children: 0  . Years of education: Not on file  . Highest education level: Not on file  Occupational History  . Occupation: Mental health assessments    Comment: [employer]  Tobacco Use  . Smoking status: Never Smoker  . Smokeless tobacco: Never Used  Vaping Use  .  Vaping Use: Never used  Substance and Sexual Activity  . Alcohol use: Yes    Comment: rare  . Drug use: No  . Sexual activity: Yes    Partners: Male    Birth control/protection: Pill  Other Topics Concern  . Not on file  Social History Narrative   Married; lives in 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    Social Determinants of Health   Financial Resource Strain: Not on file  Food Insecurity: Not on file  Transportation Needs: Not on file  Physical Activity: Not on file  Stress: Not on file  Social Connections: Not on file  Intimate Partner Violence: Not on file      Review of Systems  Cardiovascular: Positive for palpitations.  All other systems reviewed and are negative.      Objective:   Physical Exam Vitals reviewed.  Constitutional:      Appearance: She is well-developed.  Cardiovascular:     Rate and Rhythm: Normal rate and regular rhythm.     Heart sounds: Normal heart sounds.  Pulmonary:     Effort: Pulmonary effort is normal.     Breath sounds: Normal breath sounds.           Assessment & Plan:  Chronic fatigue - Plan: modafinil (PROVIGIL) 200 MG tablet, CBC with Differential/Platelet, COMPLETE METABOLIC PANEL WITH GFR, Vitamin B12, TSH  Palpitations  Chronic fatigue syndrome  Given the palpitations and the fatigue I would like to get a Holter monitor to rule out any underlying cardiac arrhythmias.  However her fatigue has now been present for several years with no other worrisome symptoms to suggest an underlying malignancy or life-threatening illness.  Therefore I believe that the patient qualifies for chronic fatigue syndrome.  We will stop Adderall  extended release and replace with Provigil 200 mg a day.  I will repeat a CBC, CMP, TSH, and B12 level just to rule out any electrolyte, hematologic, or vitamin deficiencies that could potentially cause fatigue.  We also discussed repeating a sleep study however the  patient would like to have the cardiac monitor first as she is already had a sleep study that showed no sleep apnea and she is corrected admitting that her weight is better now than it was previously when she had the sleep study.

## 2020-05-08 LAB — CBC WITH DIFFERENTIAL/PLATELET
Absolute Monocytes: 683 cells/uL (ref 200–950)
Basophils Absolute: 40 cells/uL (ref 0–200)
Basophils Relative: 0.6 %
Eosinophils Absolute: 47 cells/uL (ref 15–500)
Eosinophils Relative: 0.7 %
HCT: 44.5 % (ref 35.0–45.0)
Hemoglobin: 15 g/dL (ref 11.7–15.5)
Lymphs Abs: 2874 cells/uL (ref 850–3900)
MCH: 31.1 pg (ref 27.0–33.0)
MCHC: 33.7 g/dL (ref 32.0–36.0)
MCV: 92.1 fL (ref 80.0–100.0)
MPV: 9.9 fL (ref 7.5–12.5)
Monocytes Relative: 10.2 %
Neutro Abs: 3055 cells/uL (ref 1500–7800)
Neutrophils Relative %: 45.6 %
Platelets: 269 10*3/uL (ref 140–400)
RBC: 4.83 10*6/uL (ref 3.80–5.10)
RDW: 13.1 % (ref 11.0–15.0)
Total Lymphocyte: 42.9 %
WBC: 6.7 10*3/uL (ref 3.8–10.8)

## 2020-05-08 LAB — COMPLETE METABOLIC PANEL WITH GFR
AG Ratio: 1.8 (calc) (ref 1.0–2.5)
ALT: 11 U/L (ref 6–29)
AST: 13 U/L (ref 10–30)
Albumin: 4.3 g/dL (ref 3.6–5.1)
Alkaline phosphatase (APISO): 78 U/L (ref 31–125)
BUN: 19 mg/dL (ref 7–25)
CO2: 25 mmol/L (ref 20–32)
Calcium: 9.6 mg/dL (ref 8.6–10.2)
Chloride: 102 mmol/L (ref 98–110)
Creat: 0.86 mg/dL (ref 0.50–1.10)
GFR, Est African American: 97 mL/min/{1.73_m2} (ref >=60)
GFR, Est Non African American: 83 mL/min/{1.73_m2} (ref >=60)
Globulin: 2.4 g/dL (calc) (ref 1.9–3.7)
Glucose, Bld: 65 mg/dL (ref 65–99)
Potassium: 4.2 mmol/L (ref 3.5–5.3)
Sodium: 139 mmol/L (ref 135–146)
Total Bilirubin: 0.7 mg/dL (ref 0.2–1.2)
Total Protein: 6.7 g/dL (ref 6.1–8.1)

## 2020-05-08 LAB — TSH: TSH: 3 mIU/L

## 2020-05-08 LAB — VITAMIN B12: Vitamin B-12: 595 pg/mL (ref 200–1100)

## 2020-05-11 ENCOUNTER — Encounter: Payer: Self-pay | Admitting: Cardiology

## 2020-05-11 ENCOUNTER — Ambulatory Visit (INDEPENDENT_AMBULATORY_CARE_PROVIDER_SITE_OTHER): Payer: BC Managed Care – PPO

## 2020-05-11 ENCOUNTER — Other Ambulatory Visit: Payer: Self-pay

## 2020-05-11 ENCOUNTER — Ambulatory Visit (INDEPENDENT_AMBULATORY_CARE_PROVIDER_SITE_OTHER): Payer: BC Managed Care – PPO | Admitting: Cardiology

## 2020-05-11 ENCOUNTER — Encounter: Payer: Self-pay | Admitting: *Deleted

## 2020-05-11 VITALS — BP 108/54 | HR 69 | Ht 66.0 in | Wt 221.0 lb

## 2020-05-11 DIAGNOSIS — Z1589 Genetic susceptibility to other disease: Secondary | ICD-10-CM

## 2020-05-11 DIAGNOSIS — R002 Palpitations: Secondary | ICD-10-CM | POA: Diagnosis not present

## 2020-05-11 NOTE — Patient Instructions (Signed)
Medication Instructions:  Your physician recommends that you continue on your current medications as directed. Please refer to the Current Medication list given to you today.  *If you need a refill on your cardiac medications before your next appointment, please call your pharmacy*   Testing/Procedures:  [facility] Monitor Instructions   Your physician has requested you wear your ZIO patch monitor 14 days.   This is a single patch monitor.  [employer] supplies one patch monitor per enrollment.  Additional stickers are not available.   Please do not apply patch if you will be having a Nuclear Stress Test, Echocardiogram, Cardiac CT, MRI, or Chest Xray during the time frame you would be wearing the monitor. The patch cannot be worn during these tests.  You cannot remove and re-apply the ZIO XT patch monitor.   Your ZIO patch monitor will be sent USPS Priority mail from [facility] directly to your home address. The monitor may also be mailed to a PO BOX if home delivery is not available.   It may take 3-5 days to receive your monitor after you have been enrolled.   Once you have received you monitor, please review enclosed instructions.  Your monitor has already been registered assigning a specific monitor serial # to you.   Applying the monitor   Shave hair from upper left chest.   Hold abrader disc by orange tab.  Rub abrader in 40 strokes over left upper chest as indicated in your monitor instructions.   Clean area with 4 enclosed alcohol pads .  Use all pads to assure are is cleaned thoroughly.  Let dry.   Apply patch as indicated in monitor instructions.  Patch will be place under collarbone on left side of chest with arrow pointing upward.   Rub patch adhesive wings for 2 minutes.Remove white label marked "1".  Remove white label marked "2".  Rub patch adhesive wings for 2 additional minutes.   While looking in a mirror, press and release button in center of patch.  A  small green light will flash 3-4 times .  This will be your only indicator the monitor has been turned on.     Do not shower for the first 24 hours.  You may shower after the first 24 hours.   Press button if you feel a symptom. You will hear a small click.  Record Date, Time and Symptom in the Patient Log Book.   When you are ready to remove patch, follow instructions on last 2 pages of Patient Log Book.  Stick patch monitor onto last page of Patient Log Book.   Place Patient Log Book in Ivor box.  Use locking tab on box and tape box closed securely.  The [employer] and [employer] has [employer] on it.  Please place in mailbox as soon as possible.  Your physician should have your test results approximately 7 days after the monitor has been mailed back to [facility].   Call [place] at [phone] if you have questions regarding your ZIO XT patch monitor.  Call them immediately if you see an orange light blinking on your monitor.   If your monitor falls off in less than 4 days contact our Monitor department at [phone].  If your monitor becomes loose or falls off after 4 days call [employer] at [phone] for suggestions on securing your monitor.    Follow-Up: At [facility], you and your health needs are our priority.  As  part of our continuing mission to provide you with exceptional heart care, we have created designated Provider Care Teams.  These Care Teams include your primary Cardiologist (physician) and Advanced Practice Providers (APPs -  Physician Assistants and Nurse Practitioners) who all work together to provide you with the care you need, when you need it.  We recommend signing up for the patient portal called "MyChart".  Sign up information is provided on this After Visit Summary.  MyChart is used to connect with patients for Virtual Visits (Telemedicine).  Patients are able to view lab/test results, encounter notes, upcoming appointments, etc.   Non-urgent messages can be sent to your provider as well.   To learn more about what you can do with MyChart, go to [email or web address].    Your next appointment:   6 week(s)  The format for your next appointment:   In Person  Provider:   Glenetta Hew, MD

## 2020-05-11 NOTE — Progress Notes (Signed)
Patient ID: Belinda Martin, female   DOB: 1975-11-30, 45 y.o.   MRN: [id number] Patient enrolled for [employer] to ship a 14 day ZIO XT long term holter monitor to her home.

## 2020-05-11 NOTE — Progress Notes (Signed)
Primary Care Provider: [name], MD Cardiologist: No primary care provider on file. Electrophysiologist: None  Clinic Note: Chief Complaint  Patient presents with   Palpitations    Rapid heartbeats that wake her up at night   New Patient (Initial Visit)   ===================================  ASSESSMENT/PLAN   Problem List Items Addressed This Visit    Rapid palpitations - Primary (Chronic)    She said that she is having these episodes maybe once or twice a week.  LMP 48-hour monitor would be sufficient.   We want to capture several episodes in order to fully assess her symptoms..  We discussed different potential etiologies including sinus tachycardia, SVT, atrial flutter, or PAT.  Atrial fibrillation is not as likely, she is telling me that these episodes are relatively regular in nature.  There are also relatively short-lived, and A. fib is usually long-lasting.  Plan: 14-hour Zio patch monitor Pending results, may want to consider echocardiogram and possible stress test.      Relevant Orders   EKG 12-Lead   LONG TERM MONITOR (3-14 DAYS)     ===================================  HPI:    Belinda Martin is a 45 y.o. female with a PMH of the Obesity, ADHD (recently Adderall stopped with plans to start Provigil pending evaluation of palpitations) below who is being seen today for the evaluation of PALPITATIONS and CHRONIC FATIGUE at the request of [name], MD.  Belinda Martin was just seen on February 3 by Dr.Pickard with complaints of chronic fatigue and rapid palpitations that wake her up from sleep.  She was referred for cardiac evaluation.  Monitor was not ordered.  We did order several labs.  Fortunately they also noted normal.  May consider sleep study as well.  Recent Hospitalizations: None  Reviewed  CV studies:    The following studies were reviewed today: (if available, images/films reviewed: From Epic Chart or Care Everywhere)  She had an  echocardiogram in 2010 reviewed below.:   Interval History:   Belinda Martin is a very pleasant woman formally morbidly obese having lost 90 pounds over the last year.  She has made dramatic changes to her diet and also significant increase her exercise with a new spouse.  She hiked 6 miles this past weekend.  She really has no major complaints from a overall cardiac standpoint with exception of having these intermittent spells that wake her up from sleep with tachycardia.  They happen maybe once or twice a week.  They always occur at night when she is sleeping.  They always wake her up from sleep and she feels fast heart rate is very regular.  It is self-limiting, but can last anywhere from 10 to 30 minutes.  She does not feel anything like this during the daytime.  She does not feel any palpitations during the daytime.  She has no other complaints of dizziness when these episodes occur.  However, they are not fast, she may get little bit short of breath and have some tightness in her chest.  CV Review of Symptoms (Summary): no chest pain or dyspnea on exertion positive for - palpitations, rapid heart rate and Associated with shortness of breath and maybe mild chest tightness.  Otherwise the symptoms are not present. negative for - chest pain, dyspnea on exertion, edema, irregular heartbeat, orthopnea, paroxysmal nocturnal dyspnea, shortness of breath or Syncope/near syncope or TIA/amaurosis fugax, claudication  The patient does not have symptoms concerning for COVID-19 infection (fever, chills, cough, or  new shortness of breath).   REVIEWED OF SYSTEMS   Review of Systems  Constitutional: Positive for malaise/fatigue (She has chronic fatigue) and weight loss (Intentional.  90 pounds in 1 year).  HENT: Negative for congestion and sinus pain.   Respiratory: Negative for cough and shortness of breath (Mild shortness of breath when she has her fast heart rate spells.).   Cardiovascular: Positive for  palpitations (Rapid heart rate as well as the back of her sleep.  See HPI).  Gastrointestinal: Negative for blood in stool and melena.  Genitourinary: Negative for hematuria.  Musculoskeletal: Negative for joint pain.  Neurological: Negative for dizziness and headaches.  Psychiatric/Behavioral: Negative for depression and memory loss. The patient is nervous/anxious. The patient does not have insomnia.     I have reviewed and (if needed) personally updated the patient's problem list, medications, allergies, past medical and surgical history, social and family history.   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   fexofenadine (ALLEGRA) 180 MG tablet Take 180 mg by mouth daily.   folic acid (FOLVITE) 1 MG tablet Take 3 mg by mouth daily.   Glucosamine 500 MG  CAPS Take 1,000 mg by mouth daily.   Ibuprofen-Famotidine (DUEXIS) 800-26.6 MG TABS Take 1 tablet by mouth as needed.   modafinil (PROVIGIL) 200 MG tablet Take 1 tablet (200 mg total) by mouth daily. Stop adderall   Multiple Vitamins-Calcium (ONE-A-DAY WOMENS FORMULA PO) Take 1 tablet by mouth daily.   norethindrone (MICRONOR) 0.35 MG tablet Take 1 tablet (0.35 mg total) by mouth daily.   Omega-3 Fatty Acids (FISH OIL) 1000 MG CAPS Take 2 capsules by mouth daily.   tiZANidine (ZANAFLEX) 4 MG tablet Take 1 tablet (4 mg total) by mouth every 6 (six) hours as needed for muscle spasms.   triamcinolone cream (KENALOG) 0.1 % Apply 1 application topically 2 (two) times daily as needed.   vitamin B-12 (CYANOCOBALAMIN) 500 MCG tablet Take 1,000 mcg by mouth daily.   [DISCONTINUED] ADDERALL XR 15 MG 24 hr capsule Take 1 capsule by mouth every morning. 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       She has lost about 90 pounds in the last year-dietary modification and try to increase exercise level.   Her new husband has encouraged her to increase her exercise => she walks most days.  They go hiking on the weekends.      OBJCTIVE -PE, EKG, labs   Wt Readings from Last 3 Encounters:  05/11/20 221 lb (100.2 kg)  05/07/20 216 lb (98 kg)  02/19/20 210 lb (95.3 kg)    Physical Exam: BP (!) 108/54    Pulse 69    Ht 5\' 6"  (1.676 m)    Wt 221 lb (100.2 kg)    SpO2 100%    BMI 35.67 kg/m  Physical  Exam Vitals reviewed.  Constitutional:      Appearance: Normal appearance. She is obese. She is not ill-appearing, toxic-appearing or diaphoretic.     Comments: Well-groomed.  HENT:     Head: Normocephalic and atraumatic.  Neck:     Vascular: No carotid bruit.  Cardiovascular:     Rate and Rhythm: Normal rate and regular rhythm.  No extrasystoles are present.    Chest Wall: PMI displaced: Unable to palpate.     Pulses: Normal pulses and intact distal pulses.     Heart sounds: S1 normal and S2 normal. Heart sounds are distant. No murmur heard. No friction rub. No gallop.   Pulmonary:     Effort: Pulmonary effort is normal. No respiratory distress.     Breath sounds: Normal breath sounds. No wheezing, rhonchi or rales.  Chest:     Chest wall: No tenderness.  Abdominal:     General: Bowel sounds are normal. There is no distension.     Palpations: Abdomen is soft. There is no mass.     Comments: Obese.  Unable to assess HSM.  Musculoskeletal:        General: Swelling (Trivial pedal edema) present. Normal range of motion.     Cervical back: Normal range of motion and neck supple.  Skin:    General: Skin is warm and dry.  Neurological:     General: No focal deficit present.     Mental Status: She is alert and oriented to person, place, and time.     Motor: No weakness.     Gait: Gait normal.  Psychiatric:        Mood and Affect: Mood normal.        Behavior: Behavior normal.        Thought Content: Thought content normal.        Judgment: Judgment normal.     Comments: A little anxious.      Adult ECG Report  Rate: 69 ;  Rhythm: normal sinus rhythm and Normal axis, intervals durations.  ;   Narrative Interpretation: Normal EKG.  Recent Labs: Reviewed No results found for: CHOL, HDL, LDLCALC, LDLDIRECT, TRIG, CHOLHDL Lab Results  Component Value Date   CREATININE 0.86 05/07/2020   BUN 19 05/07/2020   NA 139 05/07/2020   K  4.2 05/07/2020   CL 102 05/07/2020   CO2 25  05/07/2020   CBC Latest Ref Rng & Units 05/07/2020 02/08/2019 04/11/2018  WBC 3.8 - 10.8 Thousand/uL 6.7 8.9 10.3  Hemoglobin 11.7 - 15.5 g/dL 15.0 13.6 13.9  Hematocrit 35.0 - 45.0 % 44.5 41.2 40.7  Platelets 140 - 400 Thousand/uL 269 301 345    Lab Results  Component Value Date   TSH 3.00 05/07/2020    ==================================================  COVID-19 Education: The signs and symptoms of COVID-19 were discussed with the patient and how to seek care for testing (follow up with PCP or arrange E-visit).   The importance of social distancing and COVID-19 vaccination was discussed today. The patient is practicing social distancing & Masking.   I spent a total of 20minutes with the patient spent in direct patient consultation.  Additional time spent with chart review  / charting (studies, outside notes, etc): 16 min Total Time: 50 min   Current medicines are reviewed at length with the patient today.  (+/- concerns) n/a  This visit occurred during the SARS-CoV-2 public health emergency.  Safety protocols were in place, including screening questions prior to the visit, additional usage of staff PPE, and extensive cleaning of exam room while observing appropriate contact time as indicated for disinfecting solutions.  Notice: This dictation was prepared with Dragon dictation along with smaller phrase technology. Any transcriptional errors that result from this process are unintentional and may not be corrected upon review.  Patient Instructions / Medication Changes & Studies & Tests Ordered   Patient Instructions  Medication Instructions:  Your physician recommends that you continue on your current medications as directed. Please refer to the Current Medication list given to you today.  *If you need a refill on your cardiac medications before your next appointment, please call your pharmacy*   Testing/Procedures:  [place] Monitor Instructions   Your physician has  requested you wear your ZIO patch monitor 14 days.   This is a single patch monitor.  [employer] supplies one patch monitor per enrollment.  Additional stickers are not available.   Follow-Up: At [facility], you and your health needs are our priority.  As part of our continuing mission to provide you with exceptional heart care, we have created designated Provider Care Teams.  These Care Teams include your primary Cardiologist (physician) and Advanced Practice Providers (APPs -  Physician Assistants and Nurse Practitioners) who all work together to provide you with the care you need, when you need it.  We recommend signing up for the patient portal called "MyChart".  Sign up information is provided on this After Visit Summary.  MyChart is used to connect with patients for Virtual Visits (Telemedicine).  Patients are able to view lab/test results, encounter notes, upcoming appointments, etc.  Non-urgent messages can be sent to your provider as well.   To learn more about what you can do with MyChart, go to [email or web address].    Your next appointment:   6 week(s)  The format for your next appointment:   In Person  Provider:   Glenetta Hew, MD     Studies Ordered:   Orders Placed This Encounter  Procedures   LONG TERM MONITOR (3-14 DAYS)   EKG 12-Lead     Glenetta Hew, M.D., M.S. Interventional Cardiologist   Pager # [phone] Phone # [phone] [address]. [place], [place] [address]   Thank you for choosing Heartcare at [facility]!!

## 2020-05-12 ENCOUNTER — Encounter: Payer: Self-pay | Admitting: Cardiology

## 2020-05-12 ENCOUNTER — Other Ambulatory Visit: Payer: Self-pay

## 2020-05-12 MED ORDER — NORETHINDRONE 0.35 MG PO TABS: 1.0000 | ORAL_TABLET | Freq: Every day | ORAL | 1 refills | Status: DC

## 2020-05-12 NOTE — Assessment & Plan Note (Signed)
She said that she is having these episodes maybe once or twice a week.  LMP 48-hour monitor would be sufficient.   We want to capture several episodes in order to fully assess her symptoms..  We discussed different potential etiologies including sinus tachycardia, SVT, atrial flutter, or PAT.  Atrial fibrillation is not as likely, she is telling me that these episodes are relatively regular in nature.  There are also relatively short-lived, and A. fib is usually long-lasting.  Plan: 14-hour Zio patch monitor Pending results, may want to consider echocardiogram and possible stress test.

## 2020-05-12 NOTE — Telephone Encounter (Signed)
Request for Norethindrone recv'd from [facility]  Identifiers did not match; called pt to verify; refill eRx'd.

## 2020-05-19 DIAGNOSIS — R002 Palpitations: Secondary | ICD-10-CM | POA: Diagnosis not present

## 2020-06-03 ENCOUNTER — Other Ambulatory Visit: Payer: Self-pay | Admitting: Obstetrics and Gynecology

## 2020-06-03 NOTE — Telephone Encounter (Signed)
Please advise. Patient is scheduled with you on 06/23/20. Has not seen AMS. Thanks,  Butch Penny

## 2020-06-22 NOTE — Progress Notes (Signed)
PCP:  [name], MD   Chief Complaint  Patient presents with  . Gynecologic Exam  . Menstrual Problem    2 cyles per month at times for the past couple of months, heavy flow, severe cramping first day of cycle     HPI:      Belinda Martin is a 45 y.o. G1P0010 whose LMP was Patient's last menstrual period was 06/13/2020 (exact date)., presents today for her annual examination.  Her menses are Q3-5 wks now on POPs, lasting 5-7 days, mod flow (heavier flow, changing products Q2-3 hrs past few cycles).  Dysmenorrhea mild to mod, improved with NSAIDs prn. She does not have intermenstrual bleeding.  Sex activity: single partner, contraception - oral progesterone-only contraceptive. On POPs since last yr; hx of visual changes with headaches in past. Did depo in HS with wt gain and worsening migraines.  Last Pap: 06/10/19  Results were: no abnormalities /neg HPV DNA   Pt with severe fatigue and being treated for chronic fatigue syndrome. MD wants to start provigil which will decrease effectiveness of POPs, if she gets cardiac clearance to take it. Has had issues with tachycardia and is seeing cardio; did Holter monitor recently and gets results tomorrow. Pt interested in different BC if start provigil.  Last mammogram: 07/19/19  Results were cat 3 LT breast; had stable LT breast imaging 10/21; due for bilat mammo 4/22. Pt had lost wt and most likely contributor to mammo changes.  There is no FH of breast cancer. There is no FH of ovarian cancer. The patient does self-breast exams.  Tobacco use: The patient denies current or previous tobacco use. Alcohol use: none No drug use.  Exercise: very active; has lost 100#  She does get adequate calcium but not Vitamin D in her diet.   Past Medical History:  Diagnosis Date  . Asthma    Allergic  . DDD (degenerative disc disease), lumbar   . Depression    as adolescent  . Fatigue   . GERD (gastroesophageal reflux disease)   . Hair  loss    excessive  . History of abnormal cervical Pap smear 2009   ASCUS with positive HRHPV; colpo:HPV effect  . History of echocardiogram 10/10   EF 65-70%, normal valves  . History of genital warts 1998  . 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 Irritable bowel syndrome   . Obesity   . Pseudotumor cerebri   . 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 Electrocautery of condyloma accuminata  09/2007   Dr Rayford Halsted  . NASAL SINUS SURGERY    . TONSILLECTOMY    . TRANSTHORACIC ECHOCARDIOGRAM  10/10   EF 65-70%, normal valves    Family History  Problem Relation Age of Onset  . Arrhythmia Mother   . Heart murmur Mother   . Endometriosis Mother   . Fibromyalgia Mother   . Irritable bowel syndrome Mother   . Deep vein thrombosis Father        MTHFR homozygous  . Atrial fibrillation Father   . Hypertension Father   . Heart failure Paternal Grandfather        CHF;  . Stomach cancer Paternal Grandfather   . Crohn's disease Maternal Grandmother   . Diabetes Maternal Grandmother   . Heart murmur Paternal Uncle   . Coronary artery disease Neg Hx        premature  CAD  . Breast cancer Neg Hx     Social History   Socioeconomic History  . Marital status: Married    Spouse name: Not on file  . Number of children: 0  . Years of education: Not on file  . Highest education level: Not on file  Occupational History  . Occupation: Mental health assessments    Comment: [employer]  Tobacco Use  . Smoking status: Never Smoker  . Smokeless tobacco: Never Used  Vaping Use  . Vaping Use: Never used  Substance and Sexual Activity  . Alcohol use: Yes    Comment: rare  . Drug use: No  . Sexual activity: Yes    Partners: Male    Birth control/protection: Pill  Other Topics Concern  . 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       She has lost about 90 pounds in the last year-dietary modification and try to increase exercise level.   Her new husband has encouraged her to increase her exercise => she walks most days.  They go hiking on the weekends.     Social Determinants of Health   Financial Resource Strain: Not on file  Food Insecurity: Not on file  Transportation Needs: Not on file  Physical Activity: Not on file  Stress: Not on file  Social Connections: Not on file  Intimate Partner Violence: Not on file     Current Outpatient Medications:  .  Biotin 10 MG TABS, Take by mouth., Disp: , Rfl:  .  fexofenadine (ALLEGRA) 180 MG tablet, Take 180 mg by mouth daily., Disp: , Rfl:  .  folic acid (FOLVITE) 1 MG tablet, Take 3 mg by mouth daily., Disp: , Rfl:  .  Glucosamine 500 MG CAPS, Take 1,000 mg by mouth daily., Disp: , Rfl:  .  Ibuprofen-Famotidine (DUEXIS) 800-26.6 MG TABS, Take 1 tablet by mouth as needed., Disp: , Rfl:  .  Multiple Vitamins-Calcium (ONE-A-DAY WOMENS FORMULA PO), Take 1 tablet by mouth daily., Disp: , Rfl:  .  norethindrone (MICRONOR) 0.35 MG tablet, Take 1 tablet (0.35 mg total) by mouth daily., Disp: 1 tablet, Rfl: 1 .  Omega-3 Fatty Acids (FISH OIL) 1000 MG CAPS, Take 2 capsules by mouth daily., Disp: , Rfl:  .  tiZANidine (ZANAFLEX) 4 MG tablet, Take 1 tablet (4 mg total) by mouth every 6 (six) hours as needed for muscle spasms., Disp: 30 tablet, Rfl: 1 .  triamcinolone cream (KENALOG) 0.1 %, Apply 1 application topically 2 (two) times daily as needed., Disp: 80 g, Rfl: 2 .  vitamin B-12 (CYANOCOBALAMIN) 500 MCG tablet, Take 1,000 mcg by mouth daily., Disp: , Rfl:  .  modafinil (PROVIGIL) 200 MG tablet, Take 1 tablet (200 mg total) by mouth daily. Stop adderall (Patient not taking: Reported on 06/23/2020), Disp: 30 tablet, Rfl: 0     ROS:  Review of Systems  Constitutional: Positive for fatigue. Negative for fever and unexpected weight change.  Respiratory:  Negative for cough, shortness of breath and wheezing.   Cardiovascular: Negative for chest pain, palpitations and leg swelling.  Gastrointestinal: Negative for blood in stool, constipation, diarrhea, nausea and vomiting.  Endocrine: Negative for cold intolerance, heat intolerance and polyuria.  Genitourinary: Negative for dyspareunia, dysuria, flank pain, frequency, genital sores, hematuria, menstrual problem, pelvic pain, urgency, vaginal bleeding, vaginal discharge and vaginal pain.  Musculoskeletal: Negative for back  pain, joint swelling and myalgias.  Skin: Negative for rash.  Neurological: Positive for numbness. Negative for dizziness, syncope, light-headedness and headaches.  Hematological: Negative for adenopathy.  Psychiatric/Behavioral: Negative for agitation, confusion, sleep disturbance and suicidal ideas. The patient is not nervous/anxious.   BREAST: No symptoms   Objective: BP 118/60   Ht 5\' 6"  (1.676 m)   Wt 223 lb (101.2 kg)   LMP 06/13/2020 (Exact Date)   BMI 35.99 kg/m    Physical Exam Constitutional:      Appearance: She is well-developed.  Genitourinary:     Vulva normal.     Right Labia: No rash, tenderness or lesions.    Left Labia: No tenderness, lesions or rash.    No vaginal discharge, erythema or tenderness.      Right Adnexa: not tender and no mass present.    Left Adnexa: not tender and no mass present.    No cervical friability or polyp.     Uterus is not enlarged or tender.  Breasts:     Right: No mass, nipple discharge, skin change or tenderness.     Left: No mass, nipple discharge, skin change or tenderness.    Neck:     Thyroid: No thyromegaly.  Cardiovascular:     Rate and Rhythm: Normal rate and regular rhythm.     Heart sounds: Normal heart sounds. No murmur heard.   Pulmonary:     Effort: Pulmonary effort is normal.     Breath sounds: Normal breath sounds.  Abdominal:     Palpations: Abdomen is soft.     Tenderness: There is no  abdominal tenderness. There is no guarding or rebound.  Musculoskeletal:        General: Normal range of motion.     Cervical back: Normal range of motion.  Lymphadenopathy:     Cervical: No cervical adenopathy.  Neurological:     General: No focal deficit present.     Mental Status: She is alert and oriented to person, place, and time.     Cranial Nerves: No cranial nerve deficit.  Skin:    General: Skin is warm and dry.  Psychiatric:        Mood and Affect: Mood normal.        Behavior: Behavior normal.        Thought Content: Thought content normal.        Judgment: Judgment normal.  Vitals reviewed.     Results: Results for orders placed or performed in visit on 06/23/20 (from the past 24 hour(s))  POCT urine pregnancy     Status: Normal   Collection Time: 06/23/20 11:44 AM  Result Value Ref Range   Preg Test, Ur Negative Negative    Assessment/Plan: Encounter for annual routine gynecological examination  Encounter for surveillance of contraceptive pills--pt on POPs. Discussed prog only BC options if starts provigil. Pt interested in [place]. Will research and f/u with menses for insertion if desires.   Encounter for screening mammogram for malignant neoplasm of breast - Plan: US BREAST LTD UNI LEFT INC AXILLA, MM DIAG BREAST TOMO BILATERAL; pt to sched dx mammo and u/s.   Abnormal mammogram of left breast - Plan: US BREAST LTD UNI LEFT INC AXILLA, MM DIAG BREAST TOMO BILATERAL; pt to sched u/s.  Irregular menses - Plan: POCT urine pregnancy; neg UPT. Most likely due to POPs. Can change to slynd or do IUD with next menses. F/u prn.    GYN counsel breast self exam, mammography  screening, family planning choices, adequate intake of calcium and vitamin D, diet and exercise     F/U  Return in about 1 year (around 06/23/2021).  Margurette Brener B. Arman Loy, PA-C 06/23/2020 3:51 PM

## 2020-06-23 ENCOUNTER — Encounter: Payer: Self-pay | Admitting: Obstetrics and Gynecology

## 2020-06-23 ENCOUNTER — Other Ambulatory Visit: Payer: Self-pay

## 2020-06-23 ENCOUNTER — Ambulatory Visit (INDEPENDENT_AMBULATORY_CARE_PROVIDER_SITE_OTHER): Payer: BC Managed Care – PPO | Admitting: Obstetrics and Gynecology

## 2020-06-23 VITALS — BP 118/60 | Ht 66.0 in | Wt 223.0 lb

## 2020-06-23 DIAGNOSIS — N926 Irregular menstruation, unspecified: Secondary | ICD-10-CM

## 2020-06-23 DIAGNOSIS — R928 Other abnormal and inconclusive findings on diagnostic imaging of breast: Secondary | ICD-10-CM | POA: Insufficient documentation

## 2020-06-23 DIAGNOSIS — Z3041 Encounter for surveillance of contraceptive pills: Secondary | ICD-10-CM | POA: Diagnosis not present

## 2020-06-23 DIAGNOSIS — Z01419 Encounter for gynecological examination (general) (routine) without abnormal findings: Secondary | ICD-10-CM

## 2020-06-23 DIAGNOSIS — Z1231 Encounter for screening mammogram for malignant neoplasm of breast: Secondary | ICD-10-CM | POA: Diagnosis not present

## 2020-06-23 HISTORY — DX: Other abnormal and inconclusive findings on diagnostic imaging of breast: R92.8

## 2020-06-23 LAB — POCT URINE PREGNANCY: Preg Test, Ur: NEGATIVE

## 2020-06-23 NOTE — Patient Instructions (Addendum)
I value your feedback and you entrusting us with your care. If you get a [facility] patient survey, I would appreciate you taking the time to let us know about your experience today. Thank you!  [facility]: [phone]      

## 2020-06-24 ENCOUNTER — Encounter: Payer: Self-pay | Admitting: Cardiology

## 2020-06-24 ENCOUNTER — Ambulatory Visit: Payer: BC Managed Care – PPO | Admitting: Cardiology

## 2020-06-24 VITALS — BP 106/70 | HR 68 | Ht 66.0 in | Wt 224.0 lb

## 2020-06-24 DIAGNOSIS — R002 Palpitations: Secondary | ICD-10-CM | POA: Diagnosis not present

## 2020-06-24 NOTE — Patient Instructions (Signed)
Medication Instructions:  No Changes In Medications at this time.  *If you need a refill on your cardiac medications before your next appointment, please call your pharmacy*  Follow-Up: At [facility], you and your health needs are our priority.  As part of our continuing mission to provide you with exceptional heart care, we have created designated Provider Care Teams.  These Care Teams include your primary Cardiologist (physician) and Advanced Practice Providers (APPs -  Physician Assistants and Nurse Practitioners) who all work together to provide you with the care you need, when you need it.  Your next appointment:   AS NEEDED- WILL FOLLOW UP IN 1 YEAR   The format for your next appointment:   In Person  Provider:   Glenetta Hew, MD  Other Instructions PER DR. HARDING- OKAY TO START PROVIGIL PER PCP> IF YOU DEVELOP PALPITATIONS PLEASE STOP MEDICATIONS

## 2020-06-24 NOTE — Assessment & Plan Note (Addendum)
Really 1 short bursts of PAT on monitor.  Patient log indicated multiple different symptoms for sinus rhythm and occasional PACs or PVCs.  Relatively benign monitor.  She is sensing heart fluttering when there is no arrhythmia Arida PAC/PVC.[name]  Reassurance provided.  Would avoid treating.  The question is could she start Provigil, I do not see a reason why not to, if she were to have symptoms when they start, then perhaps we would stop.  Try to tell how she will do, but there is no arrhythmia reason to not initiate therapy.

## 2020-06-24 NOTE — Progress Notes (Signed)
Primary Care Provider: [name], MD Cardiologist: No primary care provider on file. Electrophysiologist: None  Clinic Note: Chief Complaint  Patient presents with  . Follow-up    1 month follow-up consult after monitor.  . Palpitations    Less prominent; monitor results   ===================================  ASSESSMENT/PLAN   Problem List Items Addressed This Visit    Rapid palpitations - Primary (Chronic)    Really 1 short bursts of PAT on monitor.  Patient log indicated multiple different symptoms for sinus rhythm and occasional PACs or PVCs.  Relatively benign monitor.  She is sensing heart fluttering when there is no arrhythmia Arida PAC/PVC.[name]  Reassurance provided.  Would avoid treating.  The question is could she start Provigil, I do not see a reason why not to, if she were to have symptoms when they start, then perhaps we would stop.  Try to tell how she will do, but there is no arrhythmia reason to not initiate therapy.        ===================================  HPI:    Belinda Martin is a 45 y.o. female with a PMH of the Obesity, ADHD (recently Adderall stopped with plans to start Provigil pending evaluation of palpitations) below who is being seen today for 1 month follow-up evaluation of PALPITATIONS and CHRONIC FATIGUE -> initial consult 05/11/2020, seen at the request of [name], MD.  Belinda Martin was just seen on February 3 by Dr.Pickard with complaints of chronic fatigue and rapid palpitations that wake her up from sleep.  She was referred for cardiac evaluation.   She was seen on May 11, 2020 noting episodes of palpitations lasting 10 to 30 minutes most twice a week.  They usually wake her up from sleep I feel that her heart is racing and going relatively regularly.  Maybe associated with little bit of dyspnea and tightness in the chest. She has lost 90 pounds in the last year made dramatic changes to her diet and increase her exercise  level with her new spells.  The weekend before [facility], she had been out [place] 6 miles a day.  No major complaints of chest tightness pressure or dyspnea.  No rapid heart rate spells with exercise.  Zio patch monitor ordered  Recent Hospitalizations: None  Reviewed  CV studies:    The following studies were reviewed today: (if available, images/films reviewed: From Epic Chart or Care Everywhere)  Zio patch (February 15-June 02, 2020) Patch Wear Time:  13 days and 22 hours  Patient had a min HR of 49 bpm, max HR of 168 bpm, and avg HR of 80 bpm. Predominant underlying rhythm was Sinus Rhythm. 1 run of Supraventricular Tachycardia/PAT occurred lasting 9 beats with a max rate of 138 bpm (avg 126 bpm). Isolated SVEs were rare: (<1.0%), SVE Couplets were rare (<1.0%), and no SVE Triplets were present. Isolated VEs were rare (<1.0%), VE Couplets were rare (<1.0%), and no VE Triplets were present.   Symptoms not associated with the long run of PAT.  Mostly sinus rhythm with occasional PACs and PVCs.  Interval History:   Belinda Martin returns today after wearing the monitor.  She said that she really did not have any spells while she wore the monitor that were significant.  She says that she is very active now walks almost every day and not getting any shortness of breath or dyspnea.  She walked into half miles this past weekend.  She really  has not noticed any more of these really fast heart rate spells either.  Whenever she feels palpitations happen she just stops what she is doing or takes a nice deep breaths and they tend to go away.  Nothing prolonged.  CV Review of Symptoms (Summary): no chest pain or dyspnea on exertion positive for - palpitations, rapid heart rate and Much less prominent, can be associated with discomfort and dyspnea negative for - chest pain, dyspnea on exertion, edema, irregular heartbeat, orthopnea, paroxysmal nocturnal dyspnea, shortness of breath or  Syncope/near syncope or TIA/amaurosis fugax, claudication  The patient Does Not have symptoms concerning for COVID-19 infection (fever, chills, cough, or new shortness of breath).   REVIEWED OF SYSTEMS   Review of Systems  Constitutional: Positive for malaise/fatigue (She says she has fatigue, yet she walks every day.) and weight loss (Intentional.  90 pounds in 1 year).  HENT: Negative for congestion and sinus pain.   Respiratory: Negative for cough and shortness of breath (Mild shortness of breath when she has her fast heart rate spells.).   Cardiovascular: Positive for palpitations (Less prominent.  See HPI).  Gastrointestinal: Negative for blood in stool and melena.  Genitourinary: Negative for hematuria.  Musculoskeletal: Positive for neck pain. Negative for joint pain.  Neurological: Negative for dizziness and headaches.  Psychiatric/Behavioral: Negative for depression and memory loss. The patient is nervous/anxious. The patient does not have insomnia.     I have reviewed and (if needed) personally updated the patient's problem list, medications, allergies, past medical and surgical history, social and family history.   PAST MEDICAL HISTORY   Past Medical History:  Diagnosis Date  . Asthma    Allergic  . DDD (degenerative disc disease), lumbar   . Depression    as adolescent  . Fatigue   . GERD (gastroesophageal reflux disease)   . Hair loss    excessive  . History of abnormal cervical Pap smear 2009   ASCUS with positive HRHPV; colpo:HPV effect  . History of echocardiogram 10/10   EF 65-70%, normal valves  . History of genital warts 1998  . 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 Irritable bowel syndrome   . Obesity   . Pseudotumor cerebri   . 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 Electrocautery of condyloma accuminata  09/2007   Dr Rayford Halsted  . NASAL SINUS SURGERY    .  TONSILLECTOMY    . TRANSTHORACIC ECHOCARDIOGRAM  10/10   EF 65-70%, normal valves    Immunization History  Administered Date(s) Administered  . Influenza,inj,Quad PF,6+ Mos 12/29/2014, 12/21/2015, 05/26/2017, 01/29/2018, 02/08/2019  . PFIZER(Purple Top)SARS-COV-2 Vaccination 10/01/2019, 10/22/2019  . Tdap 07/05/2013    MEDICATIONS/ALLERGIES   Current Meds  Medication Sig  . Biotin 10 MG TABS Take by mouth.  . fexofenadine (ALLEGRA) 180 MG tablet Take 180 mg by mouth daily.  . folic acid (FOLVITE) 1 MG tablet Take 3 mg by mouth daily.  . Glucosamine 500 MG CAPS Take 1,000 mg by mouth daily.  . Ibuprofen-Famotidine (DUEXIS) 800-26.6 MG TABS Take 1 tablet by mouth as needed.  . Multiple Vitamins-Calcium (ONE-A-DAY WOMENS FORMULA PO) Take 1 tablet by mouth daily.  . Omega-3 Fatty Acids (FISH OIL) 1000 MG CAPS Take 2 capsules by mouth daily.  [name] tiZANidine (ZANAFLEX) 4 MG tablet Take 1 tablet (4 mg total) by mouth every 6 (six) hours as needed  for muscle spasms.  [name] triamcinolone cream (KENALOG) 0.1 % Apply 1 application topically 2 (two) times daily as needed.  . vitamin B-12 (CYANOCOBALAMIN) 500 MCG tablet Take 1,000 mcg by mouth daily.  . [DISCONTINUED] norethindrone (MICRONOR) 0.35 MG tablet Take 1 tablet (0.35 mg total) by mouth daily.    Allergies  Allergen Reactions  . Amoxicillin     REACTION: hives    SOCIAL HISTORY/FAMILY HISTORY   Reviewed in Epic:  Pertinent findings:  Social History   Tobacco Use  . Smoking status: Never Smoker  . Smokeless tobacco: Never Used  Vaping Use  . Vaping Use: Never used  Substance Use Topics  . Alcohol use: Yes    Comment: rare  . 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       She has lost about 90 pounds in the last year-dietary modification and try to increase exercise level.   Her new husband has encouraged her  to increase her exercise => she walks most days.  They go hiking on the weekends.      OBJCTIVE -PE, EKG, labs   Wt Readings from Last 3 Encounters:  07/06/20 223 lb (101.2 kg)  06/24/20 224 lb (101.6 kg)  06/23/20 223 lb (101.2 kg)    Physical Exam: BP 106/70 (BP Location: Right Arm, Patient Position: Sitting)   Pulse 68   Ht 5\' 6"  (1.676 m)   Wt 224 lb (101.6 kg)   LMP 06/13/2020 (Exact Date)   SpO2 99%   BMI 36.15 kg/m  Physical Exam Vitals reviewed.  Constitutional:      Appearance: Normal appearance. She is obese. She is not ill-appearing, toxic-appearing or diaphoretic.     Comments: Well-groomed.  HENT:     Head: Normocephalic and atraumatic.  Neck:     Vascular: No carotid bruit.  Cardiovascular:     Rate and Rhythm: Normal rate and regular rhythm.  No extrasystoles are present.    Chest Wall: PMI displaced: Unable to palpate.     Pulses: Normal pulses and intact distal pulses.     Heart sounds: S1 normal and S2 normal. Heart sounds are distant. No murmur heard. No friction rub. No gallop.   Pulmonary:     Effort: Pulmonary effort is normal. No respiratory distress.     Breath sounds: Normal breath sounds.  Chest:     Chest wall: No tenderness.  Abdominal:     Comments: Obese.   Musculoskeletal:        General: Swelling (Trivial pedal edema) present. Normal range of motion.     Cervical back: Normal range of motion and neck supple.  Skin:    General: Skin is warm and dry.  Neurological:     General: No focal deficit present.     Mental Status: She is alert and oriented to person, place, and time.  Psychiatric:        Mood and Affect: Mood normal.        Behavior: Behavior normal.        Thought Content: Thought content normal.        Judgment: Judgment normal.     Comments: A little anxious.     Adult ECG Report N/A  Recent Labs: Reviewed No results found for: CHOL, HDL, LDLCALC, LDLDIRECT, TRIG, CHOLHDL Lab Results  Component Value Date    CREATININE 0.86 05/07/2020  BUN 19 05/07/2020   NA 139 05/07/2020   K 4.2 05/07/2020   CL 102 05/07/2020   CO2 25 05/07/2020   CBC Latest Ref Rng & Units 05/07/2020 02/08/2019 04/11/2018  WBC 3.8 - 10.8 Thousand/uL 6.7 8.9 10.3  Hemoglobin 11.7 - 15.5 g/dL 15.0 13.6 13.9  Hematocrit 35.0 - 45.0 % 44.5 41.2 40.7  Platelets 140 - 400 Thousand/uL 269 301 345    Lab Results  Component Value Date   TSH 3.00 05/07/2020    ==================================================  COVID-19 Education: The signs and symptoms of COVID-19 were discussed with the patient and how to seek care for testing (follow up with PCP or arrange E-visit).   The importance of social distancing and COVID-19 vaccination was discussed today. The patient is practicing social distancing & Masking.   I spent a total of 35 minutes with the patient spent in direct patient consultation.  Additional time spent with chart review  / charting (studies, outside notes, etc): 52min Total Time: 100min   Current medicines are reviewed at length with the patient today.  (+/- concerns) n/a  This visit occurred during the SARS-CoV-2 public health emergency.  Safety protocols were in place, including screening questions prior to the visit, additional usage of staff PPE, and extensive cleaning of exam room while observing appropriate contact time as indicated for disinfecting solutions.  Notice: This dictation was prepared with Dragon dictation along with smaller phrase technology. Any transcriptional errors that result from this process are unintentional and may not be corrected upon review.  Patient Instructions / Medication Changes & Studies & Tests Ordered   Patient Instructions  Medication Instructions:  No Changes In Medications at this time.  *If you need a refill on your cardiac medications before your next appointment, please call your pharmacy*  Follow-Up: At [facility], you and your health needs are our priority.   As part of our continuing mission to provide you with exceptional heart care, we have created designated Provider Care Teams.  These Care Teams include your primary Cardiologist (physician) and Advanced Practice Providers (APPs -  Physician Assistants and Nurse Practitioners) who all work together to provide you with the care you need, when you need it.  Your next appointment:   AS NEEDED- WILL FOLLOW UP IN 1 YEAR   The format for your next appointment:   In Person  Provider:   Glenetta Hew, MD  Other Instructions PER DR. HARDING- OKAY TO START PROVIGIL PER PCP> IF YOU DEVELOP PALPITATIONS PLEASE STOP MEDICATIONS    Studies Ordered:   No orders of the defined types were placed in this encounter.    Glenetta Hew, M.D., M.S. Interventional Cardiologist   Pager # [phone] Phone # [phone] [address] [place], [place] [address]   Thank you for choosing Heartcare at [facility]!!

## 2020-07-02 ENCOUNTER — Encounter: Payer: Self-pay | Admitting: Dermatology

## 2020-07-02 ENCOUNTER — Other Ambulatory Visit: Payer: Self-pay

## 2020-07-02 ENCOUNTER — Ambulatory Visit: Payer: BC Managed Care – PPO | Admitting: Dermatology

## 2020-07-02 DIAGNOSIS — I8310 Varicose veins of unspecified lower extremity with inflammation: Secondary | ICD-10-CM | POA: Diagnosis not present

## 2020-07-02 DIAGNOSIS — L811 Chloasma: Secondary | ICD-10-CM

## 2020-07-02 DIAGNOSIS — L304 Erythema intertrigo: Secondary | ICD-10-CM

## 2020-07-02 MED ORDER — KETOCONAZOLE 2 % EX CREA: 1.0000 "application " | TOPICAL_CREAM | Freq: Two times a day (BID) | CUTANEOUS | 1 refills | Status: AC

## 2020-07-02 MED ORDER — HYDROCORTISONE 2.5 % EX CREA: TOPICAL_CREAM | Freq: Two times a day (BID) | CUTANEOUS | 1 refills | Status: DC | PRN

## 2020-07-02 NOTE — Patient Instructions (Addendum)
Topical steroids (such as triamcinolone, fluocinolone, fluocinonide, mometasone, clobetasol, halobetasol, betamethasone, hydrocortisone) can cause thinning and lightening of the skin if they are used for too long in the same area. Your physician has selected the right strength medicine for your problem and area affected on the body. Please use your medication only as directed by your physician to prevent side effects.   Start [facility] Hydroquinone 8%, Tretinoin 0.1%, Kojic acid 1%, Niacinamide 4%, Fluocinolone 0.025% cream, a pea sized amount nightly to dark spots on face for up to 2 months. This cannot be used more than 3 months due to risk of skin atrophy (thinning) and exogenous ochronosis (permanent dark spots). The patient was advised this is not covered by insurance. They will receive an email to check out and the medication will be mailed to their home.   Instructions for [facility] Medications  One or more of your medications was sent to the [facility] mail order compounding pharmacy. You will receive an email from them and can purchase the medicine through that link. It will then be mailed to your home at the address you confirmed. If for any reason you do not receive an email from them, please check your spam folder. If you still do not find the email, please let us know. [facility] phone number is [phone].   Recommend taking Heliocare sun protection supplement daily in sunny weather for additional sun protection. For maximum protection on the sunniest days, you can take up to 2 capsules of regular Heliocare OR take 1 capsule of Heliocare Ultra. For prolonged exposure (such as a full day in the sun), you can repeat your dose of the supplement 4 hours after your first dose. Heliocare can be purchased at [facility] or at [email or web address].    If you have any questions or concerns for your doctor, please call our main line at [phone] and press option 4 to  reach your doctor's medical assistant. If no one answers, please leave a voicemail as directed and we will return your call as soon as possible. Messages left after 4 pm will be answered the following business day.   You may also send [place] a message via [place]. We typically respond to MyChart messages within 1-2 business days.  For prescription refills, please ask your pharmacy to contact our office. Our fax number is [phone].  If you have an urgent issue when the clinic is closed that cannot wait until the next business day, you can page your doctor at the number below.    Please note that while we do our best to be available for urgent issues outside of office hours, we are not available 24/7.   If you have an urgent issue and are unable to reach [place], you may choose to seek medical care at your doctor's office, retail clinic, urgent care center, or emergency room.  If you have a medical emergency, please immediately call 911 or go to the emergency department.  Pager Numbers  - Dr. Nehemiah Massed: [phone]  - Dr. Laurence Ferrari: [phone]  - Dr. Nicole Kindred: [phone]  In the event of inclement weather, please call our main line at [phone] for an update on the status of any delays or closures.  Dermatology Medication Tips: Please keep the boxes that topical medications come in in order to help keep track of the instructions about where and how to use these. Pharmacies typically print the medication instructions only on the boxes and not directly on the medication tubes.  If your medication is too expensive, please contact our office at [phone] option 4 or send [place] a message through [place].   We are unable to tell what your co-pay for medications will be in advance as this is different depending on your insurance coverage. However, we may be able to find a substitute medication at lower cost or fill out paperwork to get insurance to cover a needed medication.   If a prior  authorization is required to get your medication covered by your insurance company, please allow [place] 1-2 business days to complete this process.  Drug prices often vary depending on where the prescription is filled and some pharmacies may offer cheaper prices.  The website www.goodrx.com contains coupons for medications through different pharmacies. The prices here do not account for what the cost may be with help from insurance (it may be cheaper with your insurance), but the website can give you the price if you did not use any insurance.  - You can print the associated coupon and take it with your prescription to the pharmacy.  - You may also stop by our office during regular business hours and pick up a GoodRx coupon card.  - If you need your prescription sent electronically to a different pharmacy, notify our office through [facility] or by phone at [phone] option 4.   Melasma maintenance treatment for after stopping Skin [facility]inals cream The Inkey Tranexamic Acid at [facility] apply nightly.  The Perfect A nightly once finished with [facility] cream   colorescience sunforgettable powder mineral sunscreen    Some Recommended Sunscreens Include:  Good for Daily Wear (feels like lotion but NOT sweat resistant) Cerave AM Moisturizer with SPF EltaMD UV Lotion  Body or All Over Sunscreen EltaMD UV active for body and face Blue lizard sensitive Sun bum mineral (avoid if sensitive to scent) Aveeno Positively Mineral Neutrogena sheer zinc (Slightly harder to rub in) CVS clear zinc (Slightly harder to rub in)  Clear Face Sunscreen EltaMD UV Elements CeraVe hydrating sunscreen 50 face  Tinted Face Sunscreen Alastin Hydratint (good for most skin tones, may be slightly dark if you are very fair) Colorescience Sunforgettable Total Protection Face Shield (good for most skin tones) EltaMD UV Physical La Roche Posay Mineral Tinted Cotz Flawless Complexion   Powder  Sunscreen (Nice for reapplying or applying on the go) Colorescience Sunforgettable Total Protection Brush on Shield (available in different tints)  Face Sunscreen Available in Different Tints Colorescience Sunforgettable Total Protection Brush on Shield  bareMinerals Complexion Rescue Tinted Hydrating Gel Cream Broad Spectrum SPF 30 UnSun mineral tinted (comes in medium/dark and light/medium)  Kids (over 6 months) - Mineral Sunscreens Recommended eltaMD UV Pure MDsolarSciences KidStick 40 SPF Aveeno Baby Continuous Protection Sensitive Zinc Oxide Blue Lizard Kids mineral based sunscreen lotion Mustela Mineral Sunscreen for face and body Neutrogena Sheer Zinc Kids Sunscreen Stick  Tinted to look like a tan PCAskin sheer tint body spray

## 2020-07-02 NOTE — Progress Notes (Signed)
Follow-Up Visit   Subjective  Belinda Martin is a 45 y.o. female who presents for the following: Follow-up (Patient here today for melasma follow up. She has been using SkinMedicinals melasma cream without tretinoin for about 2 months. She is also using Alastin. ) and Skin Problem (Patient noticed Monday that her belly button is red, yellow stuff is coming out that smells. ).  Patient advises she did see with [name] and was told they could do sclerotherapy for $500. They did an ultrasound and it came back ok without reflux so it would not be covered by insurance.   The following portions of the chart were reviewed this encounter and updated as appropriate:   Tobacco  Allergies  Meds  Problems  Med Hx  Surg Hx  Fam Hx      Review of Systems:  No other skin or systemic complaints except as noted in HPI or Assessment and Plan.  Objective  Well appearing patient in no apparent distress; mood and affect are within normal limits.  A focused examination was performed including face, abdomen. Relevant physical exam findings are noted in the Assessment and Plan.  Objective  Umbilicus: Erythematous scaly patch  Objective  face: Reticulated hyperpigmented patches   Assessment & Plan  Erythema intertrigo Umbilicus  Start HC 9.6% cream BID for up to 2 weeks.  Start ketoconazole 2% cream BID.  Topical steroids (such as triamcinolone, fluocinolone, fluocinonide, mometasone, clobetasol, halobetasol, betamethasone, hydrocortisone) can cause thinning and lightening of the skin if they are used for too long in the same area. Your physician has selected the right strength medicine for your problem and area affected on the body. Please use your medication only as directed by your physician to prevent side effects.    Ordered Medications: ketoconazole (NIZORAL) 2 % cream hydrocortisone 2.5 % cream  Melasma face  Chronic condition with duration over one year.  Condition is bothersome to patient. Not currently at goal.  D/c hydroquinone 12%/kojic acid/vitamin C cream.  Start Skin Medicinals Hydroquinone 8%, Tretinoin 0.1%, Kojic acid 1%, Niacinamide 4%, Fluocinolone 0.025% cream, a pea sized amount nightly to dark spots on face for up to 1 month.  Hydroquinone cannot be used more than 3 months due to risk of skin atrophy (thinning) and exogenous ochronosis (permanent dark spots). The patient was advised this is not covered by insurance. They will receive an email to check out and the medication will be mailed to their home.  Continue daily tinted mineral sunscreen SPF 30+ to face  Recommend taking Heliocare sun protection supplement daily in sunny weather for additional sun protection. For maximum protection on the sunniest days, you can take up to 2 capsules of regular Heliocare OR take 1 capsule of Heliocare Ultra. For prolonged exposure (such as a full day in the sun), you can repeat your dose of the supplement 4 hours after your first dose.    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  Graciella Belton, RMA, am acting  as scribe for Forest Gleason, MD .  Documentation: I have reviewed the above documentation for accuracy and completeness, and I agree with the above.  Forest Gleason, MD

## 2020-07-03 ENCOUNTER — Telehealth: Payer: Self-pay | Admitting: Obstetrics and Gynecology

## 2020-07-03 NOTE — Telephone Encounter (Signed)
Patient coming in on Mon. 4/4 at 2:10 for Mirena insertion with ABC.

## 2020-07-06 ENCOUNTER — Encounter: Payer: Self-pay | Admitting: Obstetrics and Gynecology

## 2020-07-06 ENCOUNTER — Other Ambulatory Visit: Payer: Self-pay

## 2020-07-06 ENCOUNTER — Ambulatory Visit (INDEPENDENT_AMBULATORY_CARE_PROVIDER_SITE_OTHER): Payer: BC Managed Care – PPO | Admitting: Obstetrics and Gynecology

## 2020-07-06 VITALS — BP 110/64 | Ht 66.0 in | Wt 223.0 lb

## 2020-07-06 DIAGNOSIS — Z3041 Encounter for surveillance of contraceptive pills: Secondary | ICD-10-CM

## 2020-07-06 NOTE — Telephone Encounter (Signed)
Pt calling; pharm told her provigel interacts with all progesterone forms of bc incl IUDs.  Doesn't know what to do about appt today.  [phone]

## 2020-07-06 NOTE — Progress Notes (Signed)
[name], MD   Chief Complaint  Patient presents with  . Contraception    HPI:      [name] is a 45 y.o. G1P0010 whose LMP was Patient's last menstrual period was 07/03/2020 (exact date)., presents today for [facility] consult. Seen 06/23/20 for annual and IUD discussion. Pt with chronic fatigue syndrome and now going to start provigil (hasn't started yet), which decreases effectiveness of hormonal BC, including prog containing IUD. Pt currently on POPs for BC and hx of visual changes with headaches. Menses are Q3-5 wks, lasting 5-7 days, mod flow (heavier flow, changing products Q2-3 hrs past few cycles).  Dysmenorrhea mild to mod, improved with NSAIDs prn. She does not have intermenstrual bleeding.  Pt had wanted IUD prior to info about provigil and decreased effectiveness, but if going to have to use condoms anyway, may want to cont with POPs, especially since not sure if provigil will work or have side effects. Doesn't want to make IUD decision too quickly.    Past Medical History:  Diagnosis Date  . Asthma    Allergic  . DDD (degenerative disc disease), lumbar   . Depression    as adolescent  . Fatigue   . GERD (gastroesophageal reflux disease)   . Hair loss    excessive  . History of abnormal cervical Pap smear 2009   ASCUS with positive HRHPV; colpo:HPV effect  . History of echocardiogram 10/10   EF 65-70%, normal valves  . History of genital warts 1998  . 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 Irritable bowel syndrome   . Obesity   . Pseudotumor cerebri   . 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 Electrocautery of condyloma accuminata  09/2007   Dr Rayford Halsted  . NASAL SINUS SURGERY    . TONSILLECTOMY    . TRANSTHORACIC ECHOCARDIOGRAM  10/10   EF 65-70%, normal valves    Family History  Problem Relation Age of Onset  . Arrhythmia Mother   . Heart murmur Mother   . Endometriosis Mother    . Fibromyalgia Mother   . Irritable bowel syndrome Mother   . Deep vein thrombosis Father        MTHFR homozygous  . Atrial fibrillation Father   . Hypertension Father   . Heart failure Paternal Grandfather        CHF;  . Stomach cancer Paternal Grandfather   . Crohn's disease Maternal Grandmother   . Diabetes Maternal Grandmother   . Heart murmur Paternal Uncle   . Coronary artery disease Neg Hx        premature CAD  . Breast cancer Neg Hx     Social History   Socioeconomic History  . Marital status: Married    Spouse name: Not on file  . Number of children: 0  . Years of education: Not on file  . Highest education level: Not on file  Occupational History  . Occupation: Mental health assessments    Comment: [employer]  Tobacco Use  . Smoking status: Never Smoker  . Smokeless tobacco: Never Used  Vaping Use  . Vaping Use: Never used  Substance and Sexual Activity  . Alcohol use: Yes    Comment: rare  . Drug use: No  . Sexual activity: Yes    Partners: Male    Birth control/protection: Pill  Other Topics Concern  . 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       She has lost about 90 pounds in the last year-dietary modification and try to increase exercise level.   Her new husband has encouraged her to increase her exercise => she walks most days.  They go hiking on the weekends.     Social Determinants of Health   Financial Resource Strain: Not on file  Food Insecurity: Not on file  Transportation Needs: Not on file  Physical Activity: Not on file  Stress: Not on file  Social Connections: Not on file  Intimate Partner Violence: Not on file    Outpatient Medications Prior to Visit  Medication Sig Dispense Refill  . Biotin 10 MG TABS Take by mouth.    . fexofenadine (ALLEGRA) 180 MG tablet Take 180 mg by mouth daily.    . folic acid (FOLVITE) 1 MG  tablet Take 3 mg by mouth daily.    . Glucosamine 500 MG CAPS Take 1,000 mg by mouth daily.    . hydrocortisone 2.5 % cream Apply topically 2 (two) times daily as needed (Rash). To area at abdomen for up to 2 weeks. Avoid applying to face, groin, and axilla. Use as directed. Risk of skin atrophy with long-term use reviewed. 30 g 1  . Ibuprofen-Famotidine (DUEXIS) 800-26.6 MG TABS Take 1 tablet by mouth as needed.    [name] ketoconazole (NIZORAL) 2 % cream Apply 1 application topically 2 (two) times daily. To area at abdomen 30 g 1  . modafinil (PROVIGIL) 200 MG tablet Take 1 tablet (200 mg total) by mouth daily. Stop adderall 30 tablet 0  . Multiple Vitamins-Calcium (ONE-A-DAY WOMENS FORMULA PO) Take 1 tablet by mouth daily.    . Omega-3 Fatty Acids (FISH OIL) 1000 MG CAPS Take 2 capsules by mouth daily.    [name] tiZANidine (ZANAFLEX) 4 MG tablet Take 1 tablet (4 mg total) by mouth every 6 (six) hours as needed for muscle spasms. 30 tablet 1  . triamcinolone cream (KENALOG) 0.1 % Apply 1 application topically 2 (two) times daily as needed. 80 g 2  . vitamin B-12 (CYANOCOBALAMIN) 500 MCG tablet Take 1,000 mcg by mouth daily.    . norethindrone (MICRONOR) 0.35 MG tablet Take 1 tablet (0.35 mg total) by mouth daily. 1 tablet 1   No facility-administered medications prior to visit.      ROS:  Review of Systems  Constitutional: Positive for fatigue. Negative for fever.  Gastrointestinal: Negative for blood in stool, constipation, diarrhea, nausea and vomiting.  Genitourinary: Negative for dyspareunia, dysuria, flank pain, frequency, hematuria, urgency, vaginal bleeding, vaginal discharge and vaginal pain.  Musculoskeletal: Negative for back pain.  Skin: Negative for rash.    OBJECTIVE:   Vitals:  BP 110/64   Ht 5\' 6"  (1.676 m)   Wt 223 lb (101.2 kg)   LMP 07/03/2020 (Exact Date)   BMI 35.99 kg/m   Physical Exam Constitutional:      Appearance: Normal appearance.  Pulmonary:     Effort:  Pulmonary effort is normal.  Musculoskeletal:        General: Normal range of motion.  Neurological:     Mental Status: She is alert and oriented to person, place, and time.  Psychiatric:        Judgment: Judgment normal.    Assessment/Plan: Encounter for surveillance of contraceptive pills - Plan: Drospirenone (SLYND) 4 MG  TABS; discussed pros/cons of prog only options, including IUD. Had offered Paragard but pt worried will make her periods worse. Would like to try slynd for now to see if gets better cycle control and while starting provigil to see if it is going to work for her. Pt aware to use condoms with POPs. 2 samples given/Rx eRxd/coupon card. F/u prn. If likes slynd, can continue. If not, pt will give further thought to IUD.   Meds ordered this encounter  Medications  . Drospirenone (SLYND) 4 MG TABS    Sig: Take 1 tablet by mouth daily.    Dispense:  84 tablet    Refill:  3    Order Specific Question:   Supervising Provider    Answer:   Gae Dry [[id number]]      Return if symptoms worsen or fail to improve.  Belinda Sando B. Felisa Zechman, PA-C 07/07/2020 3:43 PM

## 2020-07-06 NOTE — Telephone Encounter (Signed)
Noted. Mirena reserved for this patient. 

## 2020-07-07 ENCOUNTER — Other Ambulatory Visit: Payer: Self-pay | Admitting: Obstetrics and Gynecology

## 2020-07-07 ENCOUNTER — Encounter: Payer: Self-pay | Admitting: Obstetrics and Gynecology

## 2020-07-07 DIAGNOSIS — Z3041 Encounter for surveillance of contraceptive pills: Secondary | ICD-10-CM

## 2020-07-07 MED ORDER — SLYND 4 MG PO TABS: 1.0000 | ORAL_TABLET | Freq: Every day | ORAL | 3 refills | Status: DC

## 2020-07-07 NOTE — Telephone Encounter (Signed)
Pls start PA. Pt had irreg bleeding with camila.

## 2020-07-07 NOTE — Patient Instructions (Signed)
I value your feedback and you entrusting us with your care. If you get a [facility] patient survey, I would appreciate you taking the time to let us know about your experience today. Thank you! ? ? ?

## 2020-07-11 ENCOUNTER — Encounter: Payer: Self-pay | Admitting: Cardiology

## 2020-07-11 IMAGING — MR MR THORACIC SPINE W/O CM
4 of 7 series · 19 of 48 positions shown · non-contrast
Comparison: None available.

CLINICAL DATA: Initial evaluation for neck and upper back pain in
between scapulae for several months, with numbness and weakness in
bilateral arms.

EXAM:
MRI THORACIC SPINE WITHOUT CONTRAST
TECHNIQUE: Multiplanar, multisequence MR imaging of the thoracic spine was
performed. No intravenous contrast was administered.

[Series 17: T1 · sagittal · 3.0mm · 0.86mm/px · 3 of 15 slices shown]
[im 1/15]
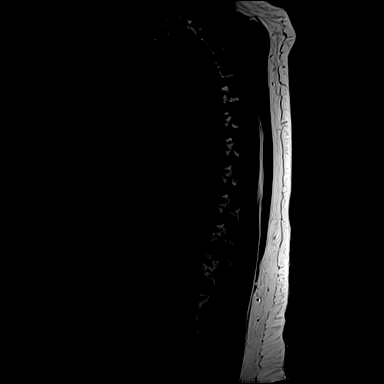
[im 8/15]
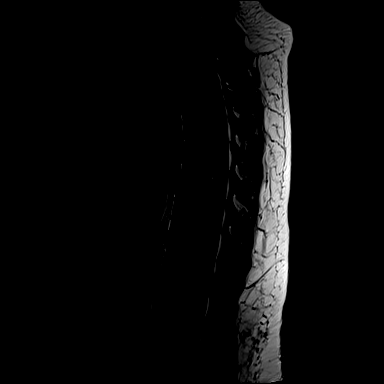
[im 15/15]
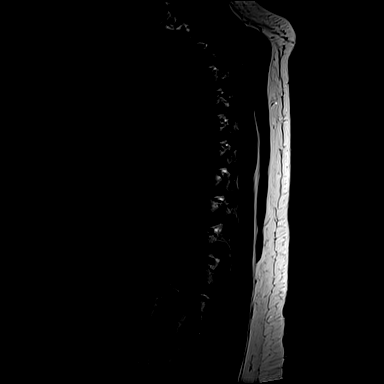

[Series 18: STIR · sagittal · 3.0mm · 1.00mm/px · 3 of 15 slices shown]
[im 1/15]
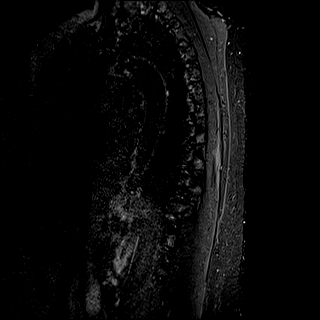
[im 8/15]
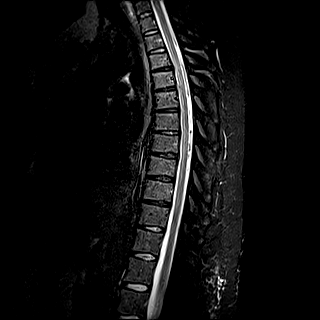
[im 15/15]
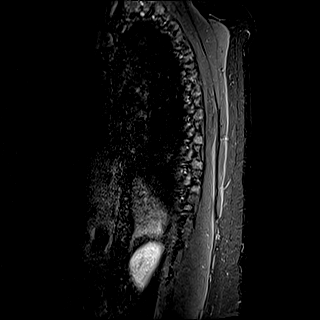

[Series 19: T2 · sagittal · 3.0mm · 0.83mm/px · 5 of 15 slices shown (1 of 2)]
[im 1/15]
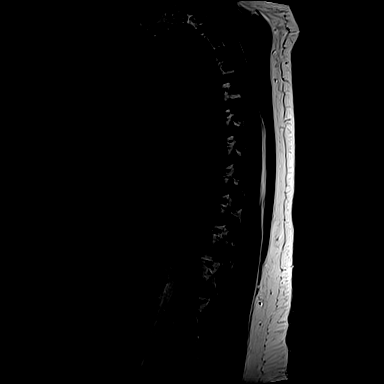
[im 4/15]
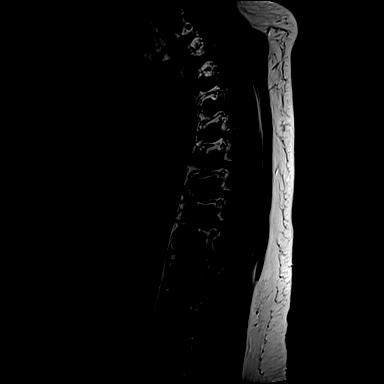
[im 8/15]
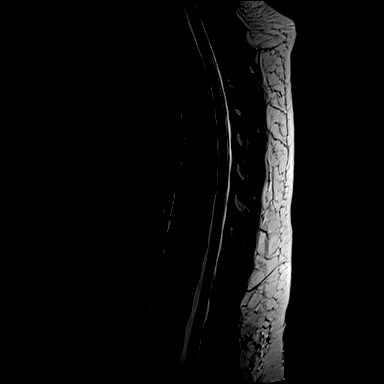
[im 11/15]
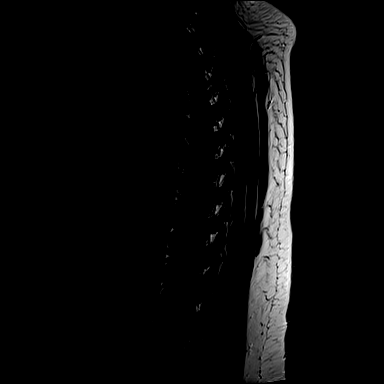
[im 15/15]
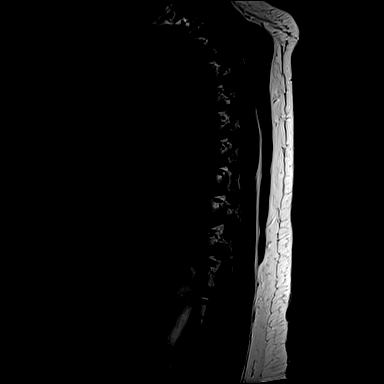

[Series 20: T2 · axial · 4.0mm · 0.28mm/px · z∈[-322,-105]mm · 8 of 39 slices shown (2 of 2)]
[im 1/39]
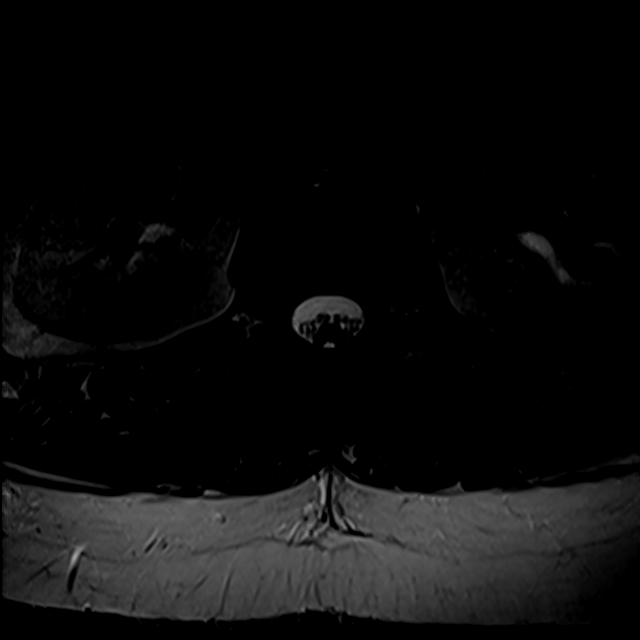
[im 7/39]
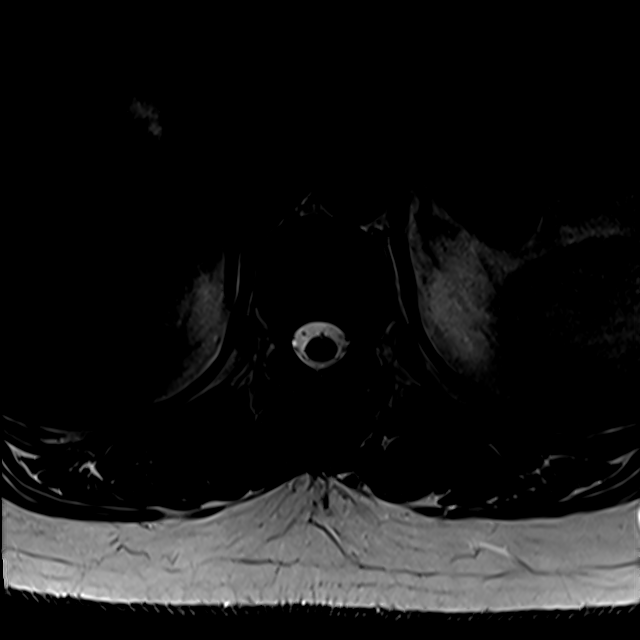
[im 11/39]
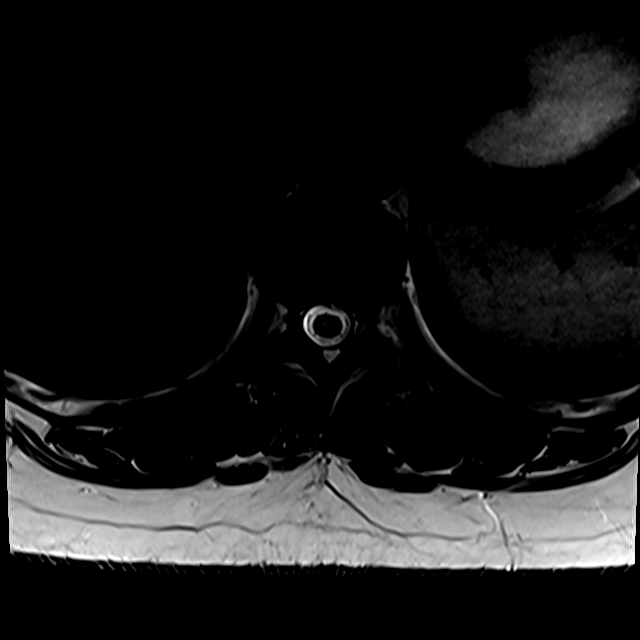
[im 18/39]
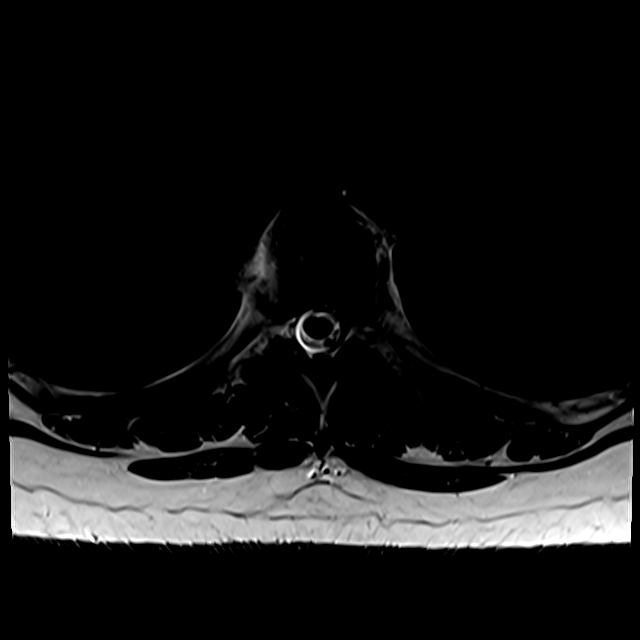
[im 21/39]
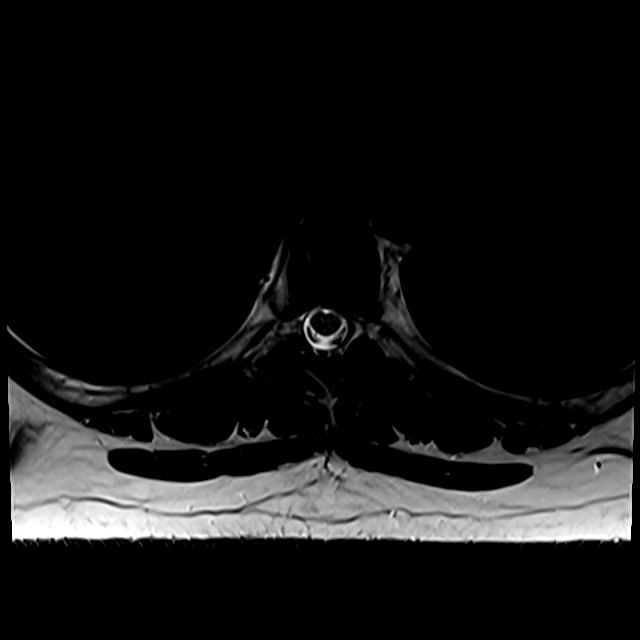
[im 28/39]
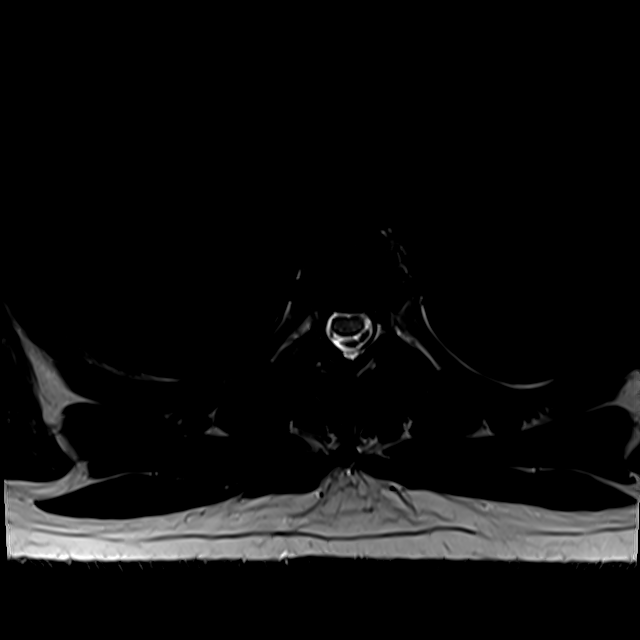
[im 32/39]
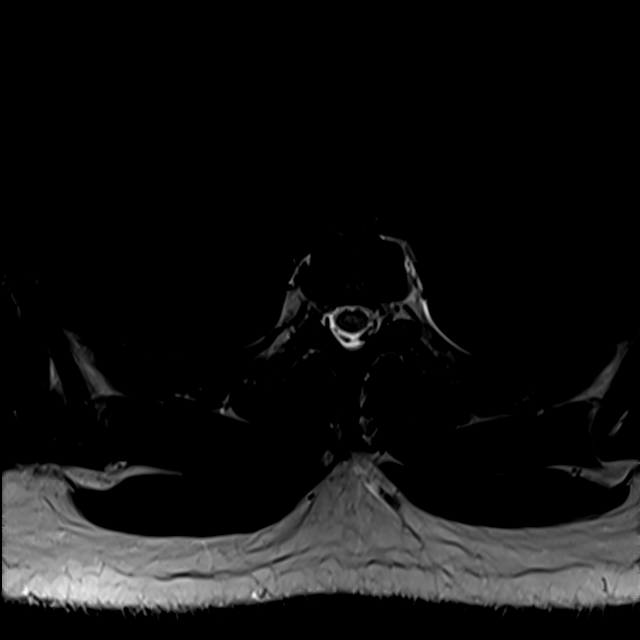
[im 35/39]
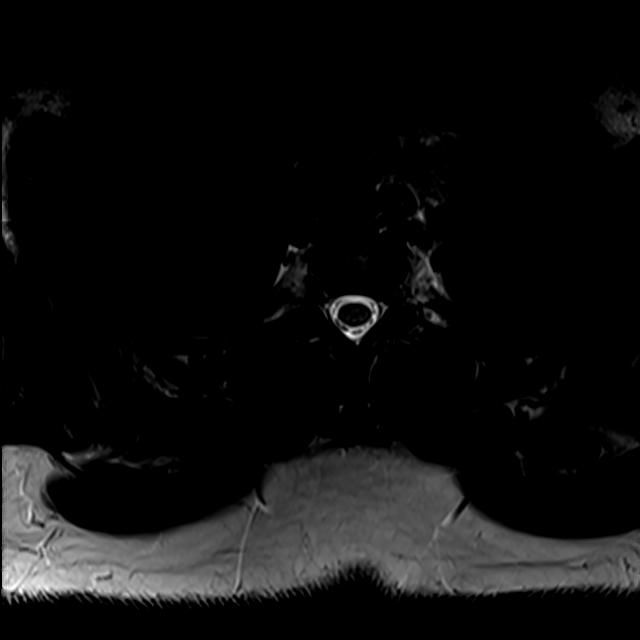

[19 of 48 positions shown; findings below may reference images not displayed]

FINDINGS: Alignment: Vertebral bodies normally aligned with preservation of
the normal thoracic kyphosis. No listhesis.

Vertebrae: Vertebral body height maintained without evidence for
acute or chronic fracture. Bone marrow signal intensity diffusely
decreased on T1 weighted imaging, nonspecific, but most commonly
related to anemia, smoking, or obesity. No discrete or worrisome
osseous lesions. No abnormal marrow edema.

Cord: Signal intensity within the thoracic spinal cord is normal.
Normal cord caliber and morphology.

Paraspinal and other soft tissues: Paraspinous soft tissues within
normal limits. Visualized visceral structures are normal.

Disc levels:

T1-2:  Unremarkable.

T2-3: Unremarkable.

T3-4: Small central disc protrusion indents the ventral thecal sac,
contacting and mildly flattening the ventral spinal cord (series 20,
image 11). No significant spinal stenosis. Foramina remain patent.

T4-5: Small central disc protrusion indents the ventral thecal sac,
slightly asymmetric to the right (series 20, image 14). Protruding
disc contacts and minimally flattens the ventral right hemicord. No
significant spinal stenosis. Foramina remain patent.

T5-6: Small central disc protrusion minimally indents the ventral
thecal sac, slightly asymmetric to the left (series 20, image 17).
Minimal flattening of the ventral spinal cord without cord signal
changes or stenosis. Foramina remain patent.

T6-7: Small central disc protrusion indents the ventral thecal sac,
slightly asymmetric to the left (series 20, image 20). Mild
flattening of the ventral left hemi cord without cord signal changes
or significant stenosis. Foramina remain widely patent.

T7-8: Small central disc protrusion indents the ventral thecal sac
(series 20, image 23). Associated annular fissure. No significant
spinal stenosis or cord deformity.

T8-9: Tiny central disc protrusion, slightly asymmetric to the left
(series 20, image 26). Associated annular fissure. No significant
spinal stenosis or cord deformity. Foramina remain patent.

T9-10: Small left paracentral disc protrusion mildly indents the
left ventral thecal sac (series 20, image 29). Associated annular
fissure. No stenosis or cord deformity. Foramina remain patent.

T10-11:  Unremarkable.

T11-12:  Unremarkable.

T12-L1:  Unremarkable.
IMPRESSION: 1. Multilevel small central disc protrusions at T3-4 through T9-10
as above. Secondary mild cord flattening at several levels without
significant spinal stenosis. Findings could contribute to patient's
symptoms and back pain.
2. Decreased T1 signal intensity throughout the visualized bone
marrow, nonspecific, but most commonly related to anemia, smoking,
or obesity. Clinical correlation suggested.

## 2020-07-30 NOTE — Telephone Encounter (Signed)
Pt called back, confirmed address we have with her insurance. Will try again submitting PA. Pt mentioned she is liking SLYND.

## 2020-08-05 NOTE — Telephone Encounter (Signed)
Tried multiple numbers on ins card, none worked. Subscriber ID given would not match their system. Called pharmacy hoping to get a number to call that shows on their side. Was lucky and given a [employer] help desk # (not on ins card) [phone]. PA papers will be faxed.

## 2020-08-06 ENCOUNTER — Other Ambulatory Visit: Payer: Self-pay | Admitting: Obstetrics and Gynecology

## 2020-08-06 DIAGNOSIS — Z3041 Encounter for surveillance of contraceptive pills: Secondary | ICD-10-CM

## 2020-08-06 NOTE — Telephone Encounter (Signed)
Left detailed msg on pharm vm: refused b/c it was eRx'd 07/07/20 #[id number] c 3refills.

## 2020-08-06 NOTE — Telephone Encounter (Signed)
PA papers received. Have fill them out and faxed them back.

## 2020-08-06 NOTE — Telephone Encounter (Signed)
Submitted

## 2020-08-10 DIAGNOSIS — G5603 Carpal tunnel syndrome, bilateral upper limbs: Secondary | ICD-10-CM | POA: Diagnosis not present

## 2020-08-18 ENCOUNTER — Other Ambulatory Visit: Payer: Self-pay

## 2020-08-18 ENCOUNTER — Encounter: Payer: Self-pay | Admitting: Obstetrics and Gynecology

## 2020-08-18 ENCOUNTER — Ambulatory Visit: Payer: BC Managed Care – PPO | Admitting: Obstetrics and Gynecology

## 2020-08-18 VITALS — BP 110/80 | Ht 66.0 in | Wt 225.0 lb

## 2020-08-18 DIAGNOSIS — R300 Dysuria: Secondary | ICD-10-CM

## 2020-08-18 DIAGNOSIS — R399 Unspecified symptoms and signs involving the genitourinary system: Secondary | ICD-10-CM

## 2020-08-18 LAB — POCT URINALYSIS DIPSTICK
Bilirubin, UA: NEGATIVE
Blood, UA: NEGATIVE
Glucose, UA: NEGATIVE
Ketones, UA: NEGATIVE
Leukocytes, UA: NEGATIVE
Nitrite, UA: NEGATIVE
Protein, UA: NEGATIVE
Spec Grav, UA: 1.01 (ref 1.010–1.025)
pH, UA: 6 (ref 5.0–8.0)

## 2020-08-18 NOTE — Telephone Encounter (Signed)
Did peer to peer review with pharmacist at [facility] for slynd. Pt has failed norethindrone options. Pharm approved slynd for 1 yr. Pt notified.

## 2020-08-18 NOTE — Progress Notes (Signed)
[name], MD   Chief Complaint  Patient presents with  . Urinary Tract Infection    Frequency and urgency urinating x 1 , burning urinating since yesterday    HPI:      Ms. MIKITA LESMEISTER is a 45 y.o. G1P0010 whose LMP was No LMP recorded., presents today for urinary frequency with good flow, occas incontinence for awhile, but developed burning on LT labia minora only with urination and lasting a few minutes after, since yesterday. No other vag sx. No hematuria, LBP, pelvic pain, fevers. Drinks over 80 oz Diet Mtn Dew daily! As well as trying to get 100 oz water.    Pt started slynd 07/06/20 for irregular menses and heavy flow with camila. LMP was much lighter and shorter. Very happy with slynd so far. Had trouble getting pre-auth but I did peer-to-peer today and got it approved for 12 months.    Past Medical History:  Diagnosis Date  . Asthma    Allergic  . DDD (degenerative disc disease), lumbar   . Depression    as adolescent  . Fatigue   . GERD (gastroesophageal reflux disease)   . Hair loss    excessive  . History of abnormal cervical Pap smear 2009   ASCUS with positive HRHPV; colpo:HPV effect  . History of echocardiogram 10/10   EF 65-70%, normal valves  . History of genital warts 1998  . 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 Irritable bowel syndrome   . Obesity   . Pseudotumor cerebri   . 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 Electrocautery of condyloma accuminata  09/2007   Dr Rayford Halsted  . NASAL SINUS SURGERY    . TONSILLECTOMY    . TRANSTHORACIC ECHOCARDIOGRAM  10/10   EF 65-70%, normal valves    Family History  Problem Relation Age of Onset  . Arrhythmia Mother   . Heart murmur Mother   . Endometriosis Mother   . Fibromyalgia Mother   . Irritable bowel syndrome Mother   . Deep vein thrombosis Father        MTHFR homozygous  . Atrial fibrillation Father   . Hypertension  Father   . Heart failure Paternal Grandfather        CHF;  . Stomach cancer Paternal Grandfather   . Crohn's disease Maternal Grandmother   . Diabetes Maternal Grandmother   . Heart murmur Paternal Uncle   . Coronary artery disease Neg Hx        premature CAD  . Breast cancer Neg Hx     Social History   Socioeconomic History  . Marital status: Married    Spouse name: Not on file  . Number of children: 0  . Years of education: Not on file  . Highest education level: Not on file  Occupational History  . Occupation: Mental health assessments    Comment: [employer]  Tobacco Use  . Smoking status: Never Smoker  . Smokeless tobacco: Never Used  Vaping Use  . Vaping Use: Never used  Substance and Sexual Activity  . Alcohol use: Yes    Comment: rare  . Drug use: No  . Sexual activity: Yes    Partners: Male    Birth control/protection: Pill  Other Topics Concern  . 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       She has lost about 90 pounds in the last year-dietary modification and try to increase exercise level.   Her new husband has encouraged her to increase her exercise => she walks most days.  They go hiking on the weekends.     Social Determinants of Health   Financial Resource Strain: Not on file  Food Insecurity: Not on file  Transportation Needs: Not on file  Physical Activity: Not on file  Stress: Not on file  Social Connections: Not on file  Intimate Partner Violence: Not on file    Outpatient Medications Prior to Visit  Medication Sig Dispense Refill  . Biotin 10 MG TABS Take by mouth.    . Drospirenone (SLYND) 4 MG TABS Take 1 tablet by mouth daily. 84 tablet 3  . fexofenadine (ALLEGRA) 180 MG tablet Take 180 mg by mouth daily.    . folic acid (FOLVITE) 1 MG tablet Take 3 mg by mouth daily.    . Glucosamine 500 MG CAPS Take 1,000 mg by mouth daily.     . hydrocortisone 2.5 % cream Apply topically 2 (two) times daily as needed (Rash). To area at abdomen for up to 2 weeks. Avoid applying to face, groin, and axilla. Use as directed. Risk of skin atrophy with long-term use reviewed. 30 g 1  . Ibuprofen-Famotidine (DUEXIS) 800-26.6 MG TABS Take 1 tablet by mouth as needed.    . modafinil (PROVIGIL) 200 MG tablet Take 1 tablet (200 mg total) by mouth daily. Stop adderall 30 tablet 0  . Multiple Vitamins-Calcium (ONE-A-DAY WOMENS FORMULA PO) Take 1 tablet by mouth daily.    . Omega-3 Fatty Acids (FISH OIL) 1000 MG CAPS Take 2 capsules by mouth daily.    [name] tiZANidine (ZANAFLEX) 4 MG tablet Take 1 tablet (4 mg total) by mouth every 6 (six) hours as needed for muscle spasms. 30 tablet 1  . triamcinolone cream (KENALOG) 0.1 % Apply 1 application topically 2 (two) times daily as needed. 80 g 2  . vitamin B-12 (CYANOCOBALAMIN) 500 MCG tablet Take 1,000 mcg by mouth daily.     No facility-administered medications prior to visit.      ROS:  Review of Systems  Constitutional: Negative for fever.  Gastrointestinal: Negative for blood in stool, constipation, diarrhea, nausea and vomiting.  Genitourinary: Positive for difficulty urinating, dysuria and frequency. Negative for dyspareunia, flank pain, hematuria, urgency, vaginal bleeding, vaginal discharge and vaginal pain.  Musculoskeletal: Negative for back pain.  Skin: Negative for rash.    OBJECTIVE:   Vitals:  BP 110/80   Ht 5\' 6"  (1.676 m)   Wt 225 lb (102.1 kg)   BMI 36.32 kg/m   Physical Exam Vitals reviewed.  Constitutional:      Appearance: She is well-developed. She is not ill-appearing or toxic-appearing.  Pulmonary:     Effort: Pulmonary effort is normal.  Abdominal:     Tenderness: There is no right CVA tenderness or left CVA tenderness.  Genitourinary:    Labia:        Right: No rash, tenderness or lesion.        Left: No rash, tenderness or lesion.      Comments: NEG EXAM  FOR DYSURIA Musculoskeletal:        General: Normal range of motion.     Cervical back: Normal range of motion.  Neurological:     General: No focal deficit present.     Mental  Status: She is alert and oriented to person, place, and time.     Cranial Nerves: No cranial nerve deficit.  Psychiatric:        Behavior: Behavior normal.        Thought Content: Thought content normal.        Judgment: Judgment normal.     Results: Results for orders placed or performed in visit on 08/18/20 (from the past 24 hour(s))  POCT Urinalysis Dipstick     Status: Normal   Collection Time: 08/18/20  2:44 PM  Result Value Ref Range   Color, UA yellow    Clarity, UA clear    Glucose, UA Negative Negative   Bilirubin, UA neg    Ketones, UA neg    Spec Grav, UA 1.010 1.010 - 1.025   Blood, UA neg    pH, UA 6.0 5.0 - 8.0   Protein, UA Negative Negative   Urobilinogen, UA     Nitrite, UA neg    Leukocytes, UA Negative Negative   Appearance     Odor       Assessment/Plan: UTI symptoms - Plan: POCT Urinalysis Dipstick, Urine Culture; neg UA. Check C&S. Will f/u if pos. Pt needs to significantly decrease caffeine/Mtn Dew intake. Most likely contributory. F/u prn  Dysuria--neg UA, neg ext GYN exam. Rule out UTI. If neg, d/c caffeine, f/u prn.      Return if symptoms worsen or fail to improve.  Herold Salguero B. Charlsey Moragne, PA-C 08/18/2020 2:46 PM

## 2020-08-18 NOTE — Patient Instructions (Signed)
I value your feedback and you entrusting us with your care. If you get a [facility] patient survey, I would appreciate you taking the time to let us know about your experience today. Thank you! ? ? ?

## 2020-08-20 LAB — URINE CULTURE

## 2020-09-01 ENCOUNTER — Telehealth: Payer: Self-pay | Admitting: *Deleted

## 2020-09-01 DIAGNOSIS — G5603 Carpal tunnel syndrome, bilateral upper limbs: Secondary | ICD-10-CM | POA: Diagnosis not present

## 2020-09-01 NOTE — Telephone Encounter (Signed)
   [facility] Pre-operative Risk Assessment    Patient Name: Belinda Martin  DOB: 09-Oct-1975  MRN: [id number]   HEARTCARE STAFF: - Please ensure there is not already an duplicate clearance open for this procedure. - Under Visit Info/Reason for Call, type in Other and utilize the format Clearance MM/DD/YY or Clearance TBD. Do not use dashes or single digits. - If request is for dental extraction, please clarify the # of teeth to be extracted. - If the patient is currently at the dentist's office, call Pre-Op APP to address. If the patient is not currently in the dentist office, please route to the Pre-Op pool  Request for surgical clearance:  1. What type of surgery is being performed? Right carpal tunnel release   2. When is this surgery scheduled? TBD   3. What type of clearance is required (medical clearance vs. Pharmacy clearance to hold med vs. Both)? medical  4. Are there any medications that need to be held prior to surgery and how long?none   5. Practice name and name of physician performing surgery? [facility]   6. What is the office phone number? [phone]   7.   What is the office fax number? [phone]  8.   Anesthesia type (None, local, MAC, general) ? Not listed   Fredia Beets 09/01/2020, 12:42 PM  _________________________________________________________________   (provider comments below)

## 2020-09-01 NOTE — Telephone Encounter (Signed)
   Name: Belinda Martin  DOB: [birth year]  MRN: [id number]   Primary Cardiologist: Glenetta Hew, MD  Chart reviewed as part of pre-operative protocol coverage.  Left a voicemail for patient to call back for ongoing preop assessment.  Abigail Butts, PA-C 09/01/2020, 4:39 PM

## 2020-09-03 NOTE — Telephone Encounter (Signed)
   Name: Belinda Martin  DOB: [birth year]  MRN: [id number]   Primary Cardiologist: Glenetta Hew, MD  Chart reviewed as part of pre-operative protocol coverage. Patient was contacted 09/03/2020 in reference to pre-operative risk assessment for pending surgery as outlined below.  Belinda Martin was last seen on 06/24/20 by Dr. Ellyn Hack.   Left VM requesting call back.   Loel Dubonnet, NP 09/03/2020, 1:12 PM

## 2020-09-04 NOTE — Telephone Encounter (Signed)
PT is returning a call 

## 2020-09-04 NOTE — Telephone Encounter (Signed)
   Name: Belinda Martin  DOB: October 30, 1975  MRN: [id number]   Primary Cardiologist: Glenetta Hew, MD  Chart reviewed as part of pre-operative protocol coverage. Patient was contacted 09/04/2020 in reference to pre-operative risk assessment for pending surgery as outlined below.  Belinda Martin was last seen on 06/24/20 by Dr. Ellyn Hack.  Since that day, Belinda Martin has done well. She reports very rare palpitation which are happening less frequently. She hikes 6 miles on the weekend without difficulty.   Therefore, based on ACC/AHA guidelines, the patient would be at acceptable risk for the planned procedure without further cardiovascular testing.   The patient was advised that if she develops new symptoms prior to surgery to contact our office to arrange for a follow-up visit, and she verbalized understanding.  I will route this recommendation to the requesting party via Epic fax function and remove from pre-op pool. Please call with questions.  Loel Dubonnet, NP 09/04/2020, 1:33 PM

## 2020-09-09 ENCOUNTER — Ambulatory Visit: Payer: BC Managed Care – PPO | Admitting: Dermatology

## 2020-10-01 ENCOUNTER — Ambulatory Visit: Payer: BC Managed Care – PPO | Admitting: Dermatology

## 2020-10-01 ENCOUNTER — Other Ambulatory Visit: Payer: Self-pay

## 2020-10-01 DIAGNOSIS — L811 Chloasma: Secondary | ICD-10-CM | POA: Diagnosis not present

## 2020-10-01 MED ORDER — AZELAIC ACID 15 % EX GEL: CUTANEOUS | 2 refills | Status: DC

## 2020-10-01 NOTE — Progress Notes (Signed)
Entered in error

## 2020-10-01 NOTE — Patient Instructions (Addendum)
Recommend taking Heliocare sun protection supplement daily in sunny weather for additional sun protection. For maximum protection on the sunniest days, you can take up to 2 capsules of regular Heliocare OR take 1 capsule of Heliocare Ultra. For prolonged exposure (such as a full day in the sun), you can repeat your dose of the supplement 4 hours after your first dose. Heliocare can be purchased at [facility] or at [email or web address].    Continue Heliocare increasing to 2 daily or 1 Ultra. Continue The Inkey Tranexamic Acid nightly.  Continue daily sunscreen.  Start the Retin A nightly as tolerated. Start azelaic acid gel twice daily.  Recommend Argan or Squalene oil at bedtime for moisturizing.   Recommend Urea 20% cream to feet at bedtime and Baby Foot.   Topical retinoid medications like tretinoin/Retin-A, adapalene/Differin, tazarotene/Fabior, and Epiduo/Epiduo Forte can cause dryness and irritation when first started. Only apply a pea-sized amount to the entire affected area. Avoid applying it around the eyes, edges of mouth and creases at the nose. If you experience irritation, use a good moisturizer first and/or apply the medicine less often. If you are doing well with the medicine, you can increase how often you use it until you are applying every night. Be careful with sun protection while using this medication as it can make you sensitive to the sun. This medicine should not be used by pregnant women.   If you have any questions or concerns for your doctor, please call our main line at [phone] and press option 4 to reach your doctor's medical assistant. If no one answers, please leave a voicemail as directed and we will return your call as soon as possible. Messages left after 4 pm will be answered the following business day.   You may also send [place] a message via [place]. We typically respond to MyChart messages within 1-2 business days.  For prescription refills, please ask  your pharmacy to contact our office. Our fax number is [phone].  If you have an urgent issue when the clinic is closed that cannot wait until the next business day, you can page your doctor at the number below.    Please note that while we do our best to be available for urgent issues outside of office hours, we are not available 24/7.   If you have an urgent issue and are unable to reach [place], you may choose to seek medical care at your doctor's office, retail clinic, urgent care center, or emergency room.  If you have a medical emergency, please immediately call 911 or go to the emergency department.  Pager Numbers  - Dr. Nehemiah Massed: [phone]  - Dr. Laurence Ferrari: [phone]  - Dr. Nicole Kindred: [phone]  In the event of inclement weather, please call our main line at [phone] for an update on the status of any delays or closures.  Dermatology Medication Tips: Please keep the boxes that topical medications come in in order to help keep track of the instructions about where and how to use these. Pharmacies typically print the medication instructions only on the boxes and not directly on the medication tubes.   If your medication is too expensive, please contact our office at [phone] option 4 or send [place] a message through [place].   We are unable to tell what your co-pay for medications will be in advance as this is different depending on your insurance coverage. However, we may be able to find a substitute medication at lower cost or fill out paperwork  to get insurance to cover a needed medication.   If a prior authorization is required to get your medication covered by your insurance company, please allow [place] 1-2 business days to complete this process.  Drug prices often vary depending on where the prescription is filled and some pharmacies may offer cheaper prices.  The website www.goodrx.com contains coupons for medications through different pharmacies. The prices here do not  account for what the cost may be with help from insurance (it may be cheaper with your insurance), but the website can give you the price if you did not use any insurance.  - You can print the associated coupon and take it with your prescription to the pharmacy.  - You may also stop by our office during regular business hours and pick up a GoodRx coupon card.  - If you need your prescription sent electronically to a different pharmacy, notify our office through [facility] or by phone at [phone] option 4.   Powder Sunscreen (Nice for reapplying or applying on the go) Colorescience Sunforgettable Total Protection Brush on Shield (available in different tints) from dermstore.com

## 2020-10-01 NOTE — Progress Notes (Signed)
   Follow-Up Visit   Subjective  Belinda Martin is a 45 y.o. female who presents for the following: Follow-up (Patient here today for melasma follow up. Patient used Skin Medicinals lightning cream for 3 months, is taking Heliocare, applying Alastin tinted sunscreen daily and started using The Inkey last week. Patient feels like her melasma is darker. She has not started the Perfect A yet. ).    The following portions of the chart were reviewed this encounter and updated as appropriate:   Tobacco  Allergies  Meds  Problems  Med Hx  Surg Hx  Fam Hx      Review of Systems:  No other skin or systemic complaints except as noted in HPI or Assessment and Plan.  Objective  Well appearing patient in no apparent distress; mood and affect are within normal limits.  A focused examination was performed including face. Relevant physical exam findings are noted in the Assessment and Plan.  Head - Anterior (Face) Reticulated hyperpigmented patches.    Assessment & Plan  Melasma Head - Anterior (Face)  Chronic condition with duration or expected duration over one year. Condition is bothersome to patient. Currently flared.  Continue Heliocare increasing to 2 regular heliocare daily or 1 ultra daily, repeat mid-day if spending a full day in the sun. Continue The Inkey Tranexamic Acid nightly.  Continue daily tinted mineral sunscreen at least SPF 30+  Start the perfect A (prescription tretinoin 0.1% cream with vitamin C) nightly as tolerated. Start azelaic acid gel twice daily.  Topical retinoid medications like tretinoin/Retin-A, adapalene/Differin, tazarotene/Fabior, and Epiduo/Epiduo Forte can cause dryness and irritation when first started. Only apply a pea-sized amount to the entire affected area. Avoid applying it around the eyes, edges of mouth and creases at the nose. If you experience irritation, use a good moisturizer first and/or apply the medicine less often. If you are doing well  with the medicine, you can increase how often you use it until you are applying every night. Be careful with sun protection while using this medication as it can make you sensitive to the sun. This medicine should not be used by pregnant women.   Fhx blood clots, oral tranexamic acid not recommended.  Azelaic Acid 15 % gel - Head - Anterior (Face) After skin is thoroughly washed and patted dry, gently but thoroughly massage a thin film of azelaic acid cream into the affected area twice daily, in the morning and evening.  Return for 2-3 month melasma follow up.  Graciella Belton, RMA, am acting as scribe for Forest Gleason, MD .   Documentation: I have reviewed the above documentation for accuracy and completeness, and I agree with the above.  Forest Gleason, MD

## 2020-10-08 ENCOUNTER — Encounter: Payer: Self-pay | Admitting: Obstetrics and Gynecology

## 2020-10-08 ENCOUNTER — Ambulatory Visit: Payer: BC Managed Care – PPO | Admitting: Obstetrics and Gynecology

## 2020-10-08 ENCOUNTER — Other Ambulatory Visit: Payer: Self-pay

## 2020-10-08 VITALS — BP 110/60 | Ht 67.0 in | Wt 222.0 lb

## 2020-10-08 DIAGNOSIS — R350 Frequency of micturition: Secondary | ICD-10-CM

## 2020-10-08 DIAGNOSIS — R102 Pelvic and perineal pain: Secondary | ICD-10-CM

## 2020-10-08 DIAGNOSIS — R399 Unspecified symptoms and signs involving the genitourinary system: Secondary | ICD-10-CM | POA: Diagnosis not present

## 2020-10-08 MED ORDER — FLUCONAZOLE 150 MG PO TABS: 150.0000 mg | ORAL_TABLET | Freq: Once | ORAL | 0 refills | Status: AC

## 2020-10-08 NOTE — Progress Notes (Signed)
Belinda Frizzle, MD   Chief Complaint  Patient presents with   Urinary Tract Infection    Frequency and burning urinating, lower back pain x on/off since May    HPI:      Belinda Martin is a 45 y.o. G1P0010 whose LMP was No LMP recorded. (Menstrual status: Oral contraceptives)., presents today for urinary frequency and urgency with good flow, as well as dysuria on the LT labia minora only with urination and wiping. No vag pain or irritation at any other time. Had same sx 5/22 with neg C&S. Pt was drinking about 80 oz Diet Mtn Dew and 100 oz water daily. Has cut down to 36 oz Diet Mtn Dew daily but still having sx. No hematuria, pelvic pain, fevers. No vag d/c, odor. Pt wiping, not blotting with unscented toilet paper.  Has RT mid back pain, hx of degenerative disc dz but pain is usually around spine. Doesn't feel the same.   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    Past Surgical History:  Procedure Laterality Date   Electrocautery of condyloma accuminata  09/2007   Dr Rayford Halsted   NASAL SINUS SURGERY     TONSILLECTOMY     TRANSTHORACIC ECHOCARDIOGRAM  10/10   EF 65-70%, normal valves    Family History  Problem Relation Age of Onset   Arrhythmia Mother    Heart murmur Mother    Endometriosis Mother    Fibromyalgia Mother    Irritable bowel syndrome Mother    Deep vein thrombosis Father        MTHFR homozygous   Atrial fibrillation Father    Hypertension  Father    Heart failure Paternal Grandfather        CHF;   Stomach cancer Paternal Grandfather    Crohn's disease Maternal Grandmother    Diabetes Maternal Grandmother    Heart murmur Paternal Uncle    Coronary artery disease Neg Hx        premature CAD   Breast cancer Neg Hx     Social History   Socioeconomic History   Marital status: Married    Spouse name: Not on file   Number of children: 0   Years of education: Not on file   Highest education level: Not on file  Occupational History   Occupation: Mental health assessments    Comment: [employer]  Tobacco Use   Smoking status: Never   Smokeless tobacco: Never  Vaping Use   Vaping Use: Never used  Substance and Sexual Activity   Alcohol use: Yes    Comment: rare   Drug use: No   Sexual activity: Yes    Partners: Male    Birth control/protection: Pill  Other Topics Concern   Not on file  Social History Narrative   Remarried; lives in [place]   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       She has lost about 90 pounds in the last year-dietary modification and try to increase exercise level.   Her new husband has encouraged her to increase her exercise => she walks most days.  They go hiking on the weekends.     Social Determinants of Health   Financial Resource Strain: Not on file  Food Insecurity: Not on file  Transportation Needs: Not on file  Physical Activity: Not on file  Stress: Not on file  Social Connections: Not on file  Intimate Partner Violence: Not on file    Outpatient Medications Prior to Visit  Medication Sig Dispense Refill   Biotin 10 MG TABS Take by mouth.     Drospirenone (SLYND) 4 MG TABS Take 1 tablet by mouth daily. 84 tablet 3   fexofenadine (ALLEGRA) 180 MG tablet Take 180 mg by mouth daily.     folic acid (FOLVITE) 1 MG tablet Take 3 mg by mouth daily.     Glucosamine 500 MG CAPS Take 1,000 mg by mouth daily.     Ibuprofen-Famotidine (DUEXIS)  800-26.6 MG TABS Take 1 tablet by mouth as needed.     Multiple Vitamins-Calcium (ONE-A-DAY WOMENS FORMULA PO) Take 1 tablet by mouth daily.     Omega-3 Fatty Acids (FISH OIL) 1000 MG CAPS Take 2 capsules by mouth daily.     tiZANidine (ZANAFLEX) 4 MG tablet Take 1 tablet (4 mg total) by mouth every 6 (six) hours as needed for muscle spasms. 30 tablet 1   vitamin B-12 (CYANOCOBALAMIN) 500 MCG tablet Take 1,000 mcg by mouth daily.     Azelaic Acid 15 % gel After skin is thoroughly washed and patted dry, gently but thoroughly massage a thin film of azelaic acid cream into the affected area twice daily, in the morning and evening. (Patient not taking: Reported on 10/08/2020) 50 g 2   hydrocortisone 2.5 % cream Apply topically 2 (two) times daily as needed (Rash). To area at abdomen for up to 2 weeks. Avoid applying to face, groin, and axilla. Use as directed. Risk of skin atrophy with long-term use reviewed. (Patient not taking: Reported on 10/08/2020) 30 g 1   modafinil (PROVIGIL) 200 MG tablet Take 1 tablet (200 mg total) by mouth daily. Stop adderall (Patient not taking: Reported on 10/08/2020) 30 tablet 0   triamcinolone cream (KENALOG) 0.1 % Apply 1 application topically 2 (two) times daily as needed. (Patient not taking: Reported on 10/08/2020) 80 g 2   No facility-administered medications prior to visit.      ROS:  Review of Systems  Constitutional:  Negative for fever.  Gastrointestinal:  Negative for blood in stool, constipation, diarrhea, nausea and vomiting.  Genitourinary:  Positive for dysuria, frequency and urgency. Negative for dyspareunia, flank pain, hematuria, vaginal bleeding, vaginal discharge and vaginal pain.  Musculoskeletal:  Positive for back pain.  Skin:  Negative for rash.  BREAST: No symptoms   OBJECTIVE:   Vitals:  BP 110/60   Ht 5\' 7"  (1.702 m)   Wt 222 lb (100.7 kg)   BMI 34.77 kg/m   Physical Exam Vitals reviewed.  Constitutional:      Appearance: Normal  appearance. She is well-developed. She is not ill-appearing or toxic-appearing.  Pulmonary:     Effort: Pulmonary effort is normal.  Abdominal:     Tenderness: There is no right CVA tenderness or left CVA tenderness.  Genitourinary:  General: Normal vulva.     Labia:        Right: No rash, tenderness or lesion.        Left: Tenderness present. No rash or lesion.        Comments: NEG EXT GYN EXAM Musculoskeletal:        General: Normal range of motion.       Arms:     Cervical back: Normal range of motion.  Neurological:     General: No focal deficit present.     Mental Status: She is alert and oriented to person, place, and time.     Cranial Nerves: No cranial nerve deficit.  Psychiatric:        Behavior: Behavior normal.        Thought Content: Thought content normal.        Judgment: Judgment normal.    Results: Results for orders placed or performed in visit on 10/08/20 (from the past 24 hour(s))  POCT Urinalysis Dipstick     Status: Normal   Collection Time: 10/09/20  9:41 AM  Result Value Ref Range   Color, UA YELLOW    Clarity, UA clear    Glucose, UA Negative Negative   Bilirubin, UA neg    Ketones, UA neg    Spec Grav, UA 1.015 1.010 - 1.025   Blood, UA neg    pH, UA 6.0 5.0 - 8.0   Protein, UA Negative Negative   Urobilinogen, UA     Nitrite, UA neg    Leukocytes, UA Negative Negative   Appearance     Odor       Assessment/Plan: Urinary frequency - Plan: POCT Urinalysis Dipstick, Urine Culture; neg UA. Recheck C&S. Sx most likely due to caffeine and volume of water. Cont to d/c caffeine and see if sx improve. If not, will screen for DM.  Vaginal pain - Plan: fluconazole (DIFLUCAN) 150 MG tablet; neg exam. Question irritation from so much wiping. Pt to blot, treat empirically for yeast with diflucan, use A&D oint as moisture barrier. F/u prn.   Meds ordered this encounter  Medications   fluconazole (DIFLUCAN) 150 MG tablet    Sig: Take 1 tablet  (150 mg total) by mouth once for 1 dose.    Dispense:  1 tablet    Refill:  0    Order Specific Question:   Supervising Provider    Answer:   Gae Dry [[id number]]     Return if symptoms worsen or fail to improve.  Sheranda Seabrooks B. Asia Favata, PA-C 10/09/2020 9:43 AM

## 2020-10-09 ENCOUNTER — Encounter: Payer: Self-pay | Admitting: Obstetrics and Gynecology

## 2020-10-09 LAB — POCT URINALYSIS DIPSTICK
Bilirubin, UA: NEGATIVE
Blood, UA: NEGATIVE
Glucose, UA: NEGATIVE
Ketones, UA: NEGATIVE
Leukocytes, UA: NEGATIVE
Nitrite, UA: NEGATIVE
Protein, UA: NEGATIVE
Spec Grav, UA: 1.015 (ref 1.010–1.025)
pH, UA: 6 (ref 5.0–8.0)

## 2020-10-10 LAB — URINE CULTURE: Organism ID, Bacteria: NO GROWTH

## 2020-10-18 ENCOUNTER — Encounter: Payer: Self-pay | Admitting: Dermatology

## 2020-12-10 ENCOUNTER — Telehealth: Payer: Self-pay

## 2020-12-10 DIAGNOSIS — Z0000 Encounter for general adult medical examination without abnormal findings: Secondary | ICD-10-CM

## 2020-12-10 DIAGNOSIS — R3589 Other polyuria: Secondary | ICD-10-CM

## 2020-12-10 NOTE — Telephone Encounter (Signed)
How frequently is she voiding? Is it good flow vs dribble? Will also check labs. Pt to sched appt. Doesn't need to be fasting.

## 2020-12-10 NOTE — Telephone Encounter (Signed)
Pt has cut down caffeine a lot, down to one cup of coffee per day. Still having a lot urinary frequency/urgency. No burning. Please advise.

## 2020-12-10 NOTE — Telephone Encounter (Signed)
Belinda Martin, pls call pt to schedule NON fasting labs.  Pt aware and says it depends, yesterday she went at least 4 times in 20 mins, sometimes its a lot of urine and others tiny bit. Also wanted to let you know she is having to wear a panty liner due to leaking.

## 2020-12-11 NOTE — Telephone Encounter (Signed)
Called and left voicemail for patient to call back to be scheduled. 

## 2020-12-18 ENCOUNTER — Other Ambulatory Visit: Payer: Self-pay

## 2020-12-18 ENCOUNTER — Ambulatory Visit
Admission: RE | Admit: 2020-12-18 | Discharge: 2020-12-18 | Disposition: A | Discharge status: Home / Self Care | Payer: BC Managed Care – PPO | Source: Ambulatory Visit | Attending: Obstetrics and Gynecology | Admitting: Obstetrics and Gynecology

## 2020-12-18 ENCOUNTER — Other Ambulatory Visit: Payer: BC Managed Care – PPO

## 2020-12-18 DIAGNOSIS — R928 Other abnormal and inconclusive findings on diagnostic imaging of breast: Secondary | ICD-10-CM | POA: Insufficient documentation

## 2020-12-18 DIAGNOSIS — Z1231 Encounter for screening mammogram for malignant neoplasm of breast: Secondary | ICD-10-CM

## 2020-12-18 DIAGNOSIS — R922 Inconclusive mammogram: Secondary | ICD-10-CM | POA: Diagnosis not present

## 2020-12-18 DIAGNOSIS — Z0000 Encounter for general adult medical examination without abnormal findings: Secondary | ICD-10-CM | POA: Diagnosis not present

## 2020-12-18 DIAGNOSIS — R3589 Other polyuria: Secondary | ICD-10-CM

## 2020-12-19 ENCOUNTER — Encounter: Payer: Self-pay | Admitting: Obstetrics and Gynecology

## 2020-12-19 LAB — COMPREHENSIVE METABOLIC PANEL
ALT: 14 IU/L (ref 0–32)
AST: 16 IU/L (ref 0–40)
Albumin/Globulin Ratio: 1.6 (ref 1.2–2.2)
Albumin: 3.9 g/dL (ref 3.8–4.8)
Alkaline Phosphatase: 96 IU/L (ref 44–121)
BUN/Creatinine Ratio: 12 (ref 9–23)
BUN: 12 mg/dL (ref 6–24)
Bilirubin Total: 0.4 mg/dL (ref 0.0–1.2)
CO2: 21 mmol/L (ref 20–29)
Calcium: 9 mg/dL (ref 8.7–10.2)
Chloride: 105 mmol/L (ref 96–106)
Creatinine, Ser: 0.98 mg/dL (ref 0.57–1.00)
Globulin, Total: 2.4 g/dL (ref 1.5–4.5)
Glucose: 78 mg/dL (ref 65–99)
Potassium: 4.5 mmol/L (ref 3.5–5.2)
Sodium: 139 mmol/L (ref 134–144)
Total Protein: 6.3 g/dL (ref 6.0–8.5)
eGFR: 73 mL/min/{1.73_m2} (ref >59)

## 2020-12-19 LAB — HEMOGLOBIN A1C
Est. average glucose Bld gHb Est-mCnc: 114 mg/dL
Hgb A1c MFr Bld: 5.6 % (ref 4.8–5.6)

## 2020-12-21 ENCOUNTER — Encounter: Payer: Self-pay | Admitting: Obstetrics and Gynecology

## 2020-12-31 ENCOUNTER — Encounter: Payer: Self-pay | Admitting: Dermatology

## 2020-12-31 ENCOUNTER — Ambulatory Visit: Payer: BC Managed Care – PPO | Admitting: Dermatology

## 2020-12-31 ENCOUNTER — Other Ambulatory Visit: Payer: Self-pay

## 2020-12-31 DIAGNOSIS — L308 Other specified dermatitis: Secondary | ICD-10-CM | POA: Diagnosis not present

## 2020-12-31 DIAGNOSIS — L811 Chloasma: Secondary | ICD-10-CM

## 2020-12-31 MED ORDER — TRIAMCINOLONE ACETONIDE 0.05 % EX OINT: TOPICAL_OINTMENT | CUTANEOUS | 1 refills | Status: AC

## 2020-12-31 NOTE — Progress Notes (Signed)
Follow-Up Visit   Subjective  Belinda Martin is a 45 y.o. female who presents for the following: Follow-up (Patient here today for 3 month melasma follow up. Patient is currently using Alastin tinted sunscreen and La Roche Posay + 50 tinted sunscreen, taking Heliocare and also using Tranexamic acid and the Perfect A at bedtime, azelaic acid twice daily.).  Patient thinks that she has had improvement.   The following portions of the chart were reviewed this encounter and updated as appropriate:   Tobacco  Allergies  Meds  Problems  Med Hx  Surg Hx  Fam Hx      Review of Systems:  No other skin or systemic complaints except as noted in HPI or Assessment and Plan.  Objective  Well appearing patient in no apparent distress; mood and affect are within normal limits.  A focused examination was performed including face. Relevant physical exam findings are noted in the Assessment and Plan.  face Reticulated hyperpigmented patches.        bilateral hands Scaly pink plaques and xerosis   Assessment & Plan  Melasma face  Chronic condition with duration over one year. Condition is bothersome to patient. Not currently at goal.  Start prescription Skin Medicinals Hydroquinone 8%, Tretinoin 0.1%, Kojic acid 1%, Niacinamide 4%, Fluocinolone 0.025% cream, a pea sized amount nightly to dark spots on face for up to 2 months. This cannot be used more than 3 months due to risk of skin atrophy (thinning) and exogenous ochronosis (permanent dark spots). The patient was advised this is not covered by insurance. They will receive an email to check out and the medication will be mailed to their home.   Reviewed risk of atrophy and hypopigmentation (stop if she notes any signs of atrophy)  Continue azelaic acid one to two times daily. Continue tranexamic acid and hydroquinone compound at bedtime. After 2 months on the hydroquinone compound, d/c hydroquinone and may restart The Perfect A at  bedtime.  May restart hydroquinone compound in June or July 2023 for 2 months.  Continue heliocare Continue tinted SPF for face q2hrs prn  Consider chemical peel or cysteamine cream if not at goal at follow-up  Recommend daily broad spectrum sunscreen SPF 30+ to sun-exposed areas, reapply every 2 hours as needed. Call for new or changing lesions.  Staying in the shade or wearing long sleeves, sun glasses (UVA+UVB protection) and wide brim hats (4-inch brim around the entire circumference of the hat) are also recommended for sun protection.    Related Medications Azelaic Acid 15 % gel After skin is thoroughly washed and patted dry, gently but thoroughly massage a thin film of azelaic acid cream into the affected area twice daily, in the morning and evening.  Other eczema bilateral hands  Chronic condition, no cure, only control. Currently flared  Recommend hand sanitizer over hand washing when able to.  Continue Neutrogena norwegian hand cream.  Start Trianex ointment twice daily to hands as needed for eczema for up to 2 weeks then weekends only. Avoid applying to face, groin, and axilla. Use as directed. Risk of skin atrophy with long-term use reviewed.   Topical steroids (such as triamcinolone, fluocinolone, fluocinonide, mometasone, clobetasol, halobetasol, betamethasone, hydrocortisone) can cause thinning and lightening of the skin if they are used for too long in the same area. Your physician has selected the right strength medicine for your problem and area affected on the body. Please use your medication only as directed by your physician to prevent  side effects.   Consider Protopic  TRIAMCINOLONE ACETONIDE, TOP, (TRIANEX) 0.05 % OINT - bilateral hands Apply twice daily to hands for up to 2 weeks then weekends only. Avoid applying to face, groin, and axilla. Use as directed. Risk of skin atrophy with long-term use reviewed.  Return in about 3 months (around 04/01/2021) for  melasma.  Graciella Belton, RMA, am acting as scribe for Forest Gleason, MD .  Documentation: I have reviewed the above documentation for accuracy and completeness, and I agree with the above.  Forest Gleason, MD

## 2020-12-31 NOTE — Patient Instructions (Addendum)
Start prescription [facility] Hydroquinone 8%, Tretinoin 0.1%, Kojic acid 1%, Niacinamide 4%, Fluocinolone 0.025% cream, a pea sized amount nightly to dark spots on face for up to 2 months. This cannot be used more than 3 months due to risk of skin atrophy (thinning) and exogenous ochronosis (permanent dark spots). The patient was advised this is not covered by insurance. They will receive an email to check out and the medication will be mailed to their home.  Continue azelaic acid one to two times daily.  Continue tranexamic acid at night after hydroquinone compound.  Instructions for [facility] Medications  One or more of your medications was sent to the [facility] mail order compounding pharmacy. You will receive an email from them and can purchase the medicine through that link. It will then be mailed to your home at the address you confirmed. If for any reason you do not receive an email from them, please check your spam folder. If you still do not find the email, please let us know. [facility] phone number is [phone].  After 2 months on the hydroquinone compound, may restart The Perfect A at bedtime.   May restart hydroquinone compound in June or July 2023 for 2 months.   Recommend hand sanitizer over hand washing when able to.  Continue Neutrogena norwegian hand cream.  Start Trianex ointment twice daily to hands as needed for eczema for up to 2 weeks then weekends only. Avoid applying to face, groin, and axilla. Use as directed. Risk of skin atrophy with long-term use reviewed.   Topical steroids (such as triamcinolone, fluocinolone, fluocinonide, mometasone, clobetasol, halobetasol, betamethasone, hydrocortisone) can cause thinning and lightening of the skin if they are used for too long in the same area. Your physician has selected the right strength medicine for your problem and area affected on the body. Please use your medication only as directed by your  physician to prevent side effects.    If you have any questions or concerns for your doctor, please call our main line at [phone] and press option 4 to reach your doctor's medical assistant. If no one answers, please leave a voicemail as directed and we will return your call as soon as possible. Messages left after 4 pm will be answered the following business day.   You may also send [place] a message via [place]. We typically respond to MyChart messages within 1-2 business days.  For prescription refills, please ask your pharmacy to contact our office. Our fax number is [phone].  If you have an urgent issue when the clinic is closed that cannot wait until the next business day, you can page your doctor at the number below.    Please note that while we do our best to be available for urgent issues outside of office hours, we are not available 24/7.   If you have an urgent issue and are unable to reach [place], you may choose to seek medical care at your doctor's office, retail clinic, urgent care center, or emergency room.  If you have a medical emergency, please immediately call 911 or go to the emergency department.  Pager Numbers  - Dr. Nehemiah Massed: [phone]  - Dr. Laurence Ferrari: [phone]  - Dr. Nicole Kindred: [phone]  In the event of inclement weather, please call our main line at [phone] for an update on the status of any delays or closures.  Dermatology Medication Tips: Please keep the boxes that topical medications come in in order to help keep track of  the instructions about where and how to use these. Pharmacies typically print the medication instructions only on the boxes and not directly on the medication tubes.   If your medication is too expensive, please contact our office at [phone] option 4 or send [place] a message through [facility].   We are unable to tell what your co-pay for medications will be in advance as this is different depending on your insurance coverage.  However, we may be able to find a substitute medication at lower cost or fill out paperwork to get insurance to cover a needed medication.   If a prior authorization is required to get your medication covered by your insurance company, please allow [place] 1-2 business days to complete this process.  Drug prices often vary depending on where the prescription is filled and some pharmacies may offer cheaper prices.  The website www.goodrx.com contains coupons for medications through different pharmacies. The prices here do not account for what the cost may be with help from insurance (it may be cheaper with your insurance), but the website can give you the price if you did not use any insurance.  - You can print the associated coupon and take it with your prescription to the pharmacy.  - You may also stop by our office during regular business hours and pick up a GoodRx coupon card.  - If you need your prescription sent electronically to a different pharmacy, notify our office through [facility] or by phone at [phone] option 4.

## 2021-01-27 ENCOUNTER — Ambulatory Visit: Payer: BC Managed Care – PPO | Admitting: Dermatology

## 2021-01-28 ENCOUNTER — Encounter: Payer: Self-pay | Admitting: Obstetrics and Gynecology

## 2021-01-28 ENCOUNTER — Other Ambulatory Visit: Payer: Self-pay | Admitting: Obstetrics and Gynecology

## 2021-01-28 MED ORDER — TOLTERODINE TARTRATE ER 4 MG PO CP24: 4.0000 mg | ORAL_CAPSULE | Freq: Every day | ORAL | 0 refills | Status: DC

## 2021-01-28 NOTE — Telephone Encounter (Signed)
Decided OCP's

## 2021-01-28 NOTE — Progress Notes (Signed)
Rx detrol for OAB/urge incont sx. Has decreased caffeine.

## 2021-02-02 ENCOUNTER — Other Ambulatory Visit: Payer: Self-pay

## 2021-02-02 ENCOUNTER — Ambulatory Visit (INDEPENDENT_AMBULATORY_CARE_PROVIDER_SITE_OTHER): Payer: BC Managed Care – PPO | Admitting: Obstetrics and Gynecology

## 2021-02-02 ENCOUNTER — Encounter: Payer: Self-pay | Admitting: Obstetrics and Gynecology

## 2021-02-02 VITALS — BP 110/70 | Ht 67.0 in | Wt 232.0 lb

## 2021-02-02 DIAGNOSIS — B3731 Acute candidiasis of vulva and vagina: Secondary | ICD-10-CM

## 2021-02-02 LAB — POCT WET PREP WITH KOH
Clue Cells Wet Prep HPF POC: NEGATIVE
KOH Prep POC: NEGATIVE
Trichomonas, UA: NEGATIVE
Yeast Wet Prep HPF POC: POSITIVE

## 2021-02-02 MED ORDER — FLUCONAZOLE 150 MG PO TABS: 150.0000 mg | ORAL_TABLET | Freq: Once | ORAL | 0 refills | Status: AC

## 2021-02-02 NOTE — Progress Notes (Signed)
[name], MD   Chief Complaint  Patient presents with   Vaginal Itching    Irritation, little discharge, no odor x last couple of days    HPI:      [name] is a 45 y.o. G1P0010 whose LMP was No LMP recorded. (Menstrual status: Oral contraceptives)., presents today for vaginal itching, irritation, increased d/c, no odor, for a few days. No meds to treat. Has burning with OTC meds. Having to wear pantyliners for OAB/urge incont. No new soaps/detergents. No other urin sx. Rxd detrol for OAB/urge incont but pt concerned about cognitive side effects. Will discuss with pharm but may want to try myrbetriq.    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    Past Surgical History:  Procedure Laterality Date   Electrocautery of condyloma accuminata  09/2007   Dr Rayford Halsted   NASAL SINUS SURGERY     TONSILLECTOMY     TRANSTHORACIC ECHOCARDIOGRAM  10/10   EF 65-70%, normal valves    Family History  Problem Relation Age of Onset   Arrhythmia Mother    Heart murmur Mother    Endometriosis Mother    Fibromyalgia Mother    Irritable bowel syndrome Mother    Deep vein thrombosis Father        MTHFR homozygous   Atrial fibrillation Father    Hypertension Father    Heart failure Paternal Grandfather        CHF;   Stomach cancer Paternal Grandfather    Crohn's disease Maternal Grandmother    Diabetes Maternal Grandmother    Heart murmur Paternal Uncle    Coronary  artery disease Neg Hx        premature CAD   Breast cancer Neg Hx     Social History   Socioeconomic History   Marital status: Married    Spouse name: Not on file   Number of children: 0   Years of education: Not on file   Highest education level: Not on file  Occupational History   Occupation: Mental health assessments    Comment: [employer]  Tobacco Use   Smoking status: Never   Smokeless tobacco: Never  Vaping Use   Vaping Use: Never used  Substance and Sexual Activity   Alcohol use: Yes    Comment: rare   Drug use: No   Sexual activity: Yes    Partners: Male    Birth control/protection: Pill  Other Topics Concern   Not on file  Social History Narrative   Remarried; lives in [place]   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       She has lost about 90 pounds in the last year-dietary modification and try to increase exercise level.   Her new husband has encouraged her to increase her exercise =>  she walks most days.  They go hiking on the weekends.     Social Determinants of Health   Financial Resource Strain: Not on file  Food Insecurity: Not on file  Transportation Needs: Not on file  Physical Activity: Not on file  Stress: Not on file  Social Connections: Not on file  Intimate Partner Violence: Not on file    Outpatient Medications Prior to Visit  Medication Sig Dispense Refill   Azelaic Acid 15 % gel After skin is thoroughly washed and patted dry, gently but thoroughly massage a thin film of azelaic acid cream into the affected area twice daily, in the morning and evening. 50 g 2   Biotin 10 MG TABS Take by mouth.     Drospirenone (SLYND) 4 MG TABS Take 1 tablet by mouth daily. 84 tablet 3   fexofenadine (ALLEGRA) 180 MG tablet Take 180 mg by mouth daily.     folic acid (FOLVITE) 1 MG tablet Take 3 mg by mouth daily.     Glucosamine 500 MG CAPS Take 1,000 mg by mouth daily.     hydrocortisone 2.5 % cream Apply topically  2 (two) times daily as needed (Rash). To area at abdomen for up to 2 weeks. Avoid applying to face, groin, and axilla. Use as directed. Risk of skin atrophy with long-term use reviewed. 30 g 1   Ibuprofen-Famotidine (DUEXIS) 800-26.6 MG TABS Take 1 tablet by mouth as needed.     Multiple Vitamins-Calcium (ONE-A-DAY WOMENS FORMULA PO) Take 1 tablet by mouth daily.     Omega-3 Fatty Acids (FISH OIL) 1000 MG CAPS Take 2 capsules by mouth daily.     tiZANidine (ZANAFLEX) 4 MG tablet Take 1 tablet (4 mg total) by mouth every 6 (six) hours as needed for muscle spasms. 30 tablet 1   tolterodine (DETROL LA) 4 MG 24 hr capsule Take 1 capsule (4 mg total) by mouth daily. 90 capsule 0   TRIAMCINOLONE ACETONIDE, TOP, (TRIANEX) 0.05 % OINT Apply twice daily to hands for up to 2 weeks then weekends only. Avoid applying to face, groin, and axilla. Use as directed. Risk of skin atrophy with long-term use reviewed. 110 g 1   vitamin B-12 (CYANOCOBALAMIN) 500 MCG tablet Take 1,000 mcg by mouth daily.     triamcinolone cream (KENALOG) 0.1 % Apply 1 application topically 2 (two) times daily as needed. (Patient not taking: No sig reported) 80 g 2   modafinil (PROVIGIL) 200 MG tablet Take 1 tablet (200 mg total) by mouth daily. Stop adderall (Patient not taking: Reported on 10/08/2020) 30 tablet 0   No facility-administered medications prior to visit.      ROS:  Review of Systems  Constitutional:  Negative for fever.  Gastrointestinal:  Negative for blood in stool, constipation, diarrhea, nausea and vomiting.  Genitourinary:  Positive for frequency and vaginal discharge. Negative for dyspareunia, dysuria, flank pain, hematuria, urgency, vaginal bleeding and vaginal pain.  Musculoskeletal:  Negative for back pain.  Skin:  Negative for rash.  BREAST: No symptoms   OBJECTIVE:   Vitals:  BP 110/70   Ht 5\' 7"  (1.702 m)   Wt 232 lb (105.2 kg)   BMI 36.34 kg/m   Physical Exam Vitals reviewed.  Constitutional:       Appearance: She is well-developed.  Pulmonary:     Effort: Pulmonary effort is normal.  Genitourinary:    Pubic Area: No rash.      Labia:  Right: Rash present. No tenderness or lesion.        Left: Rash present. No tenderness or lesion.      Vagina: Vaginal discharge present. No erythema or tenderness.     Cervix: Normal.     Uterus: Normal. Not enlarged and not tender.      Adnexa: Right adnexa normal and left adnexa normal.       Right: No mass or tenderness.         Left: No mass or tenderness.       Comments: BILAT LABIA MAJORA AND MINORA WITH ERYTHEMA; POS THICK CLUMPY VAG D/C Musculoskeletal:        General: Normal range of motion.     Cervical back: Normal range of motion.  Skin:    General: Skin is warm and dry.  Neurological:     General: No focal deficit present.     Mental Status: She is alert and oriented to person, place, and time.  Psychiatric:        Mood and Affect: Mood normal.        Behavior: Behavior normal.        Thought Content: Thought content normal.        Judgment: Judgment normal.    Results: Results for orders placed or performed in visit on 02/02/21 (from the past 24 hour(s))  POCT Wet Prep with KOH     Status: Abnormal   Collection Time: 02/02/21  2:17 PM  Result Value Ref Range   Trichomonas, UA Negative    Clue Cells Wet Prep HPF POC neg    Epithelial Wet Prep HPF POC     Yeast Wet Prep HPF POC pos    Bacteria Wet Prep HPF POC     RBC Wet Prep HPF POC     WBC Wet Prep HPF POC     KOH Prep POC Negative Negative     Assessment/Plan: Candidal vaginitis - Plan: fluconazole (DIFLUCAN) 150 MG tablet, POCT Wet Prep with KOH; pos sx and wet prep. Rx diflucan. D/c pantyliners vs keep dry (having to wear due to urge incont, trying to treat with Rx currently). F/u prn.   OAB/urge incontinence--pt with discuss risks with pharmacist and may want to change classes of meds for sx.   Meds ordered this encounter  Medications    fluconazole (DIFLUCAN) 150 MG tablet    Sig: Take 1 tablet (150 mg total) by mouth once for 1 dose. May repeat in 3 days if still having symptoms    Dispense:  2 tablet    Refill:  0    Order Specific Question:   Supervising Provider    Answer:   Gae Dry [[id number]]       Return if symptoms worsen or fail to improve.  Sumaya Riedesel B. Garo [place], PA-C 02/02/2021 2:19 PM

## 2021-03-01 ENCOUNTER — Encounter: Payer: Self-pay | Admitting: Nurse Practitioner

## 2021-03-01 ENCOUNTER — Other Ambulatory Visit: Payer: Self-pay

## 2021-03-01 ENCOUNTER — Telehealth (INDEPENDENT_AMBULATORY_CARE_PROVIDER_SITE_OTHER): Payer: BC Managed Care – PPO | Admitting: Nurse Practitioner

## 2021-03-01 DIAGNOSIS — M546 Pain in thoracic spine: Secondary | ICD-10-CM

## 2021-03-01 DIAGNOSIS — G8929 Other chronic pain: Secondary | ICD-10-CM

## 2021-03-01 DIAGNOSIS — J069 Acute upper respiratory infection, unspecified: Secondary | ICD-10-CM | POA: Diagnosis not present

## 2021-03-01 MED ORDER — TIZANIDINE HCL 4 MG PO TABS
4.0000 mg | ORAL_TABLET | Freq: Four times a day (QID) | ORAL | 0 refills | Status: DC | PRN
  Filled 2023-03-08: qty 30, 8d supply, fill #0

## 2021-03-01 MED ORDER — PROMETHAZINE-DM 6.25-15 MG/5ML PO SYRP: 5.0000 mL | ORAL_SOLUTION | Freq: Four times a day (QID) | ORAL | 0 refills | Status: DC | PRN

## 2021-03-01 NOTE — Progress Notes (Signed)
Subjective:    Patient ID: Belinda Martin, female    DOB: Jun 15, 1975, 45 y.o.   MRN: [id number]  HPI: Belinda Martin is a 45 y.o. female presenting virtually for cough.  Chief Complaint  Patient presents with   URI   Cough   UPPER RESPIRATORY TRACT INFECTION Onset: Saturday Fever: no Chills: yes Cough:  yes;  dry Shortness of breath: no Wheezing: no Chest pain: yes, with cough and back hurts; has had thoracic spasms before Chest tightness: no Chest congestion: no Nasal congestion: no Runny nose: yes Post nasal drip: yes Sneezing: yes Sore throat: no Swollen glands: no Sinus pressure: no Headache: yes Face pain: no Toothache: no Ear pain: no  Ear pressure: no  Eyes red/itching:no Eye drainage/crusting: no  Nausea: no  Vomiting: no Diarrhea: no  Change in appetite: no  Loss of taste/smell: no  Rash: no Fatigue: yes Sick contacts: no Strep contacts: no  Treatments attempted: allergy medication, Tussin DM, flonase Relief with OTC medications: some  Allergies  Allergen Reactions   Amoxicillin     REACTION: hives    Outpatient Encounter Medications as of 03/01/2021  Medication Sig   promethazine-dextromethorphan (PROMETHAZINE-DM) 6.25-15 MG/5ML syrup Take 5 mLs by mouth 4 (four) times daily as needed for cough.   Azelaic Acid 15 % gel After skin is thoroughly washed and patted dry, gently but thoroughly massage a thin film of azelaic acid cream into the affected area twice daily, in the morning and evening.   Biotin 10 MG TABS Take by mouth.   Drospirenone (SLYND) 4 MG TABS Take 1 tablet by mouth daily.   fexofenadine (ALLEGRA) 180 MG tablet Take 180 mg by mouth daily.   folic acid (FOLVITE) 1 MG tablet Take 3 mg by mouth daily.   Glucosamine 500 MG CAPS Take 1,000 mg by mouth daily.   hydrocortisone 2.5 % cream Apply topically 2 (two) times daily as needed (Rash). To area at abdomen for up to 2 weeks. Avoid applying to face, groin, and axilla. Use as  directed. Risk of skin atrophy with long-term use reviewed.   Ibuprofen-Famotidine (DUEXIS) 800-26.6 MG TABS Take 1 tablet by mouth as needed.   Multiple Vitamins-Calcium (ONE-A-DAY WOMENS FORMULA PO) Take 1 tablet by mouth daily.   Omega-3 Fatty Acids (FISH OIL) 1000 MG CAPS Take 2 capsules by mouth daily.   tiZANidine (ZANAFLEX) 4 MG tablet Take 1 tablet (4 mg total) by mouth every 6 (six) hours as needed for muscle spasms.   tolterodine (DETROL LA) 4 MG 24 hr capsule Take 1 capsule (4 mg total) by mouth daily.   TRIAMCINOLONE ACETONIDE, TOP, (TRIANEX) 0.05 % OINT Apply twice daily to hands for up to 2 weeks then weekends only. Avoid applying to face, groin, and axilla. Use as directed. Risk of skin atrophy with long-term use reviewed.   vitamin B-12 (CYANOCOBALAMIN) 500 MCG tablet Take 1,000 mcg by mouth daily.   [DISCONTINUED] tiZANidine (ZANAFLEX) 4 MG tablet Take 1 tablet (4 mg total) by mouth every 6 (six) hours as needed for muscle spasms.   [DISCONTINUED] triamcinolone cream (KENALOG) 0.1 % Apply 1 application topically 2 (two) times daily as needed. (Patient not taking: No sig reported)   No facility-administered encounter medications on file as of 03/01/2021.    Patient Active Problem List   Diagnosis Date Noted   Abnormal mammogram of left breast 06/23/2020   Rapid palpitations 05/11/2020   MTHFR gene mutation    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  Pseudotumor cerebri    Sleep disorder    Obesity    Irritable bowel syndrome    DDD (degenerative disc disease), lumbar    Environmental and seasonal allergies 07/21/2013   ASTHMA 01/15/2009   History of abnormal cervical Pap smear 04/05/2007    Past Medical History:  Diagnosis Date   Asthma    Allergic   DDD (degenerative disc disease), lumbar    Depression    as adolescent   Fatigue    GERD (gastroesophageal reflux disease)    Hair loss    excessive   History of abnormal cervical Pap smear 2009   ASCUS with positive  HRHPV; colpo:HPV effect   History of echocardiogram 10/10   EF 65-70%, normal valves   History of genital warts 1998   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 Interim medical history since our last visit reviewed.  Review of Systems Per HPI unless specifically indicated above     Objective:    There were no vitals taken for this visit.  Wt Readings from Last 3 Encounters:  02/02/21 232 lb (105.2 kg)  10/08/20 222 lb (100.7 kg)  08/18/20 225 lb (102.1 kg)    Physical Exam Vitals and nursing note reviewed.  Constitutional:      General: She is not in acute distress.    Appearance: Normal appearance. She is not toxic-appearing.  HENT:     Head: Normocephalic and atraumatic.     Right Ear: External ear normal.     Left Ear: External ear normal.     Nose: Congestion present. No rhinorrhea.     Mouth/Throat:     Mouth: Mucous membranes are moist.     Pharynx: Oropharynx is clear.  Eyes:     General: No scleral icterus.       Right eye: No discharge.        Left eye: No discharge.  Cardiovascular:     Comments: Unable to assess heart sounds via virtual visit. Pulmonary:     Effort: Pulmonary effort is normal. No respiratory distress.     Comments: Unable to assess breath sounds via virtual visit.  Patient talking in complete sentences during telemedicine visit.  No accessory muscle use. Skin:    Coloration: Skin is not jaundiced or pale.     Findings: No erythema.  Neurological:     Mental Status: She is alert and oriented to person, place, and time.  Psychiatric:        Mood and Affect: Mood normal.        Behavior: Behavior normal.        Thought Content: Thought content normal.        Judgment: Judgment normal.      Assessment & Plan:  1. Viral upper  respiratory tract infection Acute x 3 days.  Encouraged viral testing.  Reassured patient that symptoms and exam findings are most consistent with a viral upper respiratory infection and explained lack of efficacy of antibiotics against viruses.  Discussed expected course and features suggestive of secondary bacterial infection.  Continue supportive care. Increase fluid intake with water or electrolyte solution like pedialyte. Encouraged acetaminophen as needed for fever/pain. Encouraged salt water gargling, chloraseptic spray and throat lozenges. Encouraged OTC guaifenesin. Encouraged saline sinus  flushes and/or neti with humidified air.  Start cough suppressant with promethazine-dextromethorphan.  Return to clinic if symptoms persist greater than 7 days without improvement.  - SARS-CoV-2 RNA (COVID-19) and Respiratory Viral Panel, Qualitative NAAT - promethazine-dextromethorphan (PROMETHAZINE-DM) 6.25-15 MG/5ML syrup; Take 5 mLs by mouth 4 (four) times daily as needed for cough.  Dispense: 118 mL; Refill: 0  2. Chronic midline thoracic back pain Chronic.  Worsening with coughing.  Restart tizanidine to help with possible muscle spasms.  Follow-up with no improvement or if worsening.  - tiZANidine (ZANAFLEX) 4 MG tablet; Take 1 tablet (4 mg total) by mouth every 6 (six) hours as needed for muscle spasms.  Dispense: 30 tablet; Refill: 1    Follow up plan: Return if symptoms worsen or fail to improve.   Due to the catastrophic nature of the COVID-19 pandemic, this video visit was completed soley via audio and visual contact via [facility] due to the restrictions of the COVID-19 pandemic.  All issues as above were discussed and addressed. Physical exam was done as above through visual confirmation on [facility]. If it was felt that the patient should be evaluated in the office, they were directed there. The patient verbally consented to this visit. Location of the patient: home Location of the  provider: home Those involved with this call:  Provider: Noemi Chapel, DNP, FNP-C CMA: n/a Front Desk/Registration:  Eliezer Mccoy   Time spent on call:  12 minutes with patient face to face via video conference. More than 50% of this time was spent in counseling and coordination of care. 16 minutes total spent in review of patient's record and preparation of their chart. I verified patient identity using two factors (patient name and date of birth). Patient consents verbally to being seen via telemedicine visit today.

## 2021-03-05 ENCOUNTER — Telehealth: Payer: Self-pay

## 2021-03-05 LAB — SARS-COV-2 RNA (COVID-19) RESP VIRAL PNL QL NAAT
Adenovirus B: NOT DETECTED
HUMAN PARAINFLU VIRUS 1: NOT DETECTED
HUMAN PARAINFLU VIRUS 2: NOT DETECTED
HUMAN PARAINFLU VIRUS 3: NOT DETECTED
INFLUENZA A SUBTYPE H1: NOT DETECTED
INFLUENZA A SUBTYPE H3: DETECTED — AB
Influenza A: DETECTED — AB
Influenza B: NOT DETECTED
Metapneumovirus: NOT DETECTED
Respiratory Syncytial Virus A: NOT DETECTED
Respiratory Syncytial Virus B: NOT DETECTED
Rhinovirus: NOT DETECTED
SARS CoV2 RNA: NOT DETECTED

## 2021-03-05 NOTE — Telephone Encounter (Signed)
Left voicemail for patient to call the office 

## 2021-03-05 NOTE — Telephone Encounter (Signed)
Pt called in concerned about her results for her swab for the flu and covid. Please call.  Cb#: [phone]

## 2021-03-19 ENCOUNTER — Telehealth: Payer: Self-pay

## 2021-03-19 NOTE — Telephone Encounter (Signed)
I left a message on patient's voicemail to return my call. Dr. Moye will be out of the office next week and we need to reschedule her appointment.  °

## 2021-03-23 ENCOUNTER — Ambulatory Visit: Payer: BC Managed Care – PPO | Admitting: Dermatology

## 2021-04-08 ENCOUNTER — Ambulatory Visit: Payer: BC Managed Care – PPO | Admitting: Dermatology

## 2021-04-20 ENCOUNTER — Encounter: Payer: Self-pay | Admitting: Dermatology

## 2021-04-21 NOTE — Telephone Encounter (Signed)
Can you please reschedule patient for 3-6 months from now for melasma follow-up? Thank you!

## 2021-05-04 ENCOUNTER — Encounter: Payer: Self-pay | Admitting: Family Medicine

## 2021-05-04 ENCOUNTER — Ambulatory Visit: Payer: BC Managed Care – PPO | Admitting: Dermatology

## 2021-05-04 ENCOUNTER — Other Ambulatory Visit: Payer: Self-pay

## 2021-05-04 ENCOUNTER — Ambulatory Visit (INDEPENDENT_AMBULATORY_CARE_PROVIDER_SITE_OTHER): Payer: BC Managed Care – PPO | Admitting: Family Medicine

## 2021-05-04 VITALS — BP 118/72 | HR 80 | Temp 98.2°F | Resp 18 | Ht 67.0 in | Wt 232.0 lb

## 2021-05-04 DIAGNOSIS — J329 Chronic sinusitis, unspecified: Secondary | ICD-10-CM

## 2021-05-04 DIAGNOSIS — J310 Chronic rhinitis: Secondary | ICD-10-CM

## 2021-05-04 MED ORDER — AZITHROMYCIN 250 MG PO TABS: ORAL_TABLET | ORAL | 0 refills | Status: DC

## 2021-05-04 NOTE — Progress Notes (Signed)
Subjective:    Patient ID: Belinda Martin, female    DOB: 1976/01/18, 46 y.o.   MRN: [id number]  Patient reports a sinus infection for 1 week.  She reports pain and pressure in her right maxillary and right frontal sinus.  She states it hurts if he leans forward.  It hurts to blow her nose.  She has constant headache.  She has tried Sudafed, Human resources officer, and [facility] with no relief.  She denies any fever but she does report thick purulent chunks of debris that she is able to expel from her nostril when she blows.  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 50 g 2   Biotin 10 MG TABS Take by mouth.     Drospirenone (SLYND) 4 MG TABS Take 1 tablet by mouth daily. 84 tablet 3   fexofenadine (ALLEGRA) 180 MG tablet Take 180 mg by mouth daily.     folic acid (FOLVITE) 1 MG tablet Take 3 mg by mouth daily.     Glucosamine 500 MG CAPS Take 1,000 mg by mouth daily.     hydrocortisone 2.5 %  cream Apply topically 2 (two) times daily as needed (Rash). To area at abdomen for up to 2 weeks. Avoid applying to face, groin, and axilla. Use as directed. Risk of skin atrophy with long-term use reviewed. 30 g 1   Ibuprofen-Famotidine (DUEXIS) 800-26.6 MG TABS Take 1 tablet by mouth as needed.     Multiple Vitamins-Calcium (ONE-A-DAY WOMENS FORMULA PO) Take 1 tablet by mouth daily.     Omega-3 Fatty Acids (FISH OIL) 1000 MG CAPS Take 2 capsules by mouth daily.     promethazine-dextromethorphan (PROMETHAZINE-DM) 6.25-15 MG/5ML syrup Take 5 mLs by mouth 4 (four) times daily as needed for cough. 118 mL 0   tiZANidine (ZANAFLEX) 4 MG tablet Take 1 tablet (4 mg total) by mouth every 6 (six) hours as needed for muscle spasms. 30 tablet 1   tolterodine (DETROL LA) 4 MG 24 hr capsule Take 1 capsule (4 mg total) by mouth daily. 90 capsule 0   TRIAMCINOLONE ACETONIDE, TOP, (TRIANEX) 0.05 % OINT Apply twice daily to hands for up to 2 weeks then weekends only. Avoid applying to face, groin, and axilla. Use as directed. Risk of skin atrophy with long-term  use reviewed. 110 g 1   vitamin B-12 (CYANOCOBALAMIN) 500 MCG tablet Take 1,000 mcg by mouth daily.     No current facility-administered medications on file prior to visit.   Allergies  Allergen Reactions   Amoxicillin     REACTION: hives   Social History   Socioeconomic History   Marital status: Married    Spouse name: Not on file   Number of children: 0   Years of education: Not on file   Highest education level: Not on file  Occupational History   Occupation: Mental health assessments    Comment: [employer]  Tobacco Use   Smoking status: Never   Smokeless tobacco: Never  Vaping Use   Vaping Use: Never used  Substance and Sexual Activity   Alcohol use: Yes    Comment: rare   Drug use: No   Sexual activity: Yes    Partners: Male    Birth control/protection: Pill  Other Topics Concern   Not on file  Social History Narrative    Remarried; lives in [place]   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       She has lost about 90 pounds in the last year-dietary modification and try to increase exercise level.   Her new husband has encouraged her to increase her exercise => she walks most days.  They go hiking on the weekends.     Social Determinants of Health   Financial Resource Strain: Not on file  Food Insecurity: Not on file  Transportation Needs: Not on file  Physical Activity: Not on file  Stress: Not on file  Social Connections: Not on file  Intimate Partner Violence: Not on file      Review of Systems  Cardiovascular:  Positive for palpitations.  All other systems reviewed and are negative.     Objective:   Physical Exam Vitals reviewed.  Constitutional:      Appearance: She is well-developed.  HENT:     Right Ear: Tympanic membrane and ear canal normal.     Left Ear: Tympanic membrane and ear canal normal.     Nose: Mucosal edema and rhinorrhea present.     Right Sinus: Maxillary sinus tenderness and frontal sinus tenderness present.  Cardiovascular:     Rate and Rhythm: Normal rate and regular rhythm.     Heart sounds: Normal heart sounds.  Pulmonary:     Effort: Pulmonary effort is normal.     Breath sounds: Normal breath sounds.          Assessment & Plan:   Rhinosinusitis Begin a Z-Pak.  Continue to use Flonase with Sudafed.  Reassess in 1 week if no better or sooner if worse

## 2021-05-12 ENCOUNTER — Ambulatory Visit: Payer: Self-pay | Admitting: Dermatology

## 2021-07-23 ENCOUNTER — Encounter: Payer: Self-pay | Admitting: Obstetrics and Gynecology

## 2021-07-23 ENCOUNTER — Other Ambulatory Visit: Payer: Self-pay

## 2021-07-23 ENCOUNTER — Emergency Department
Admission: EM | Admit: 2021-07-23 | Discharge: 2021-07-23 | Disposition: A | Discharge status: Home / Self Care | Payer: BC Managed Care – PPO | Attending: Emergency Medicine | Admitting: Emergency Medicine

## 2021-07-23 ENCOUNTER — Other Ambulatory Visit: Payer: Self-pay | Admitting: Obstetrics and Gynecology

## 2021-07-23 ENCOUNTER — Emergency Department: Payer: BC Managed Care – PPO

## 2021-07-23 DIAGNOSIS — S51012A Laceration without foreign body of left elbow, initial encounter: Secondary | ICD-10-CM | POA: Diagnosis not present

## 2021-07-23 DIAGNOSIS — M7989 Other specified soft tissue disorders: Secondary | ICD-10-CM | POA: Diagnosis not present

## 2021-07-23 DIAGNOSIS — S51011A Laceration without foreign body of right elbow, initial encounter: Secondary | ICD-10-CM | POA: Diagnosis not present

## 2021-07-23 DIAGNOSIS — T07XXXA Unspecified multiple injuries, initial encounter: Secondary | ICD-10-CM

## 2021-07-23 DIAGNOSIS — Z23 Encounter for immunization: Secondary | ICD-10-CM | POA: Diagnosis not present

## 2021-07-23 DIAGNOSIS — S0081XA Abrasion of other part of head, initial encounter: Secondary | ICD-10-CM | POA: Insufficient documentation

## 2021-07-23 DIAGNOSIS — W540XXA Bitten by dog, initial encounter: Secondary | ICD-10-CM | POA: Insufficient documentation

## 2021-07-23 DIAGNOSIS — S40811A Abrasion of right upper arm, initial encounter: Secondary | ICD-10-CM | POA: Insufficient documentation

## 2021-07-23 DIAGNOSIS — S51052A Open bite, left elbow, initial encounter: Secondary | ICD-10-CM | POA: Diagnosis not present

## 2021-07-23 DIAGNOSIS — Z3041 Encounter for surveillance of contraceptive pills: Secondary | ICD-10-CM

## 2021-07-23 DIAGNOSIS — S61411A Laceration without foreign body of right hand, initial encounter: Secondary | ICD-10-CM | POA: Diagnosis not present

## 2021-07-23 DIAGNOSIS — S51051A Open bite, right elbow, initial encounter: Secondary | ICD-10-CM | POA: Diagnosis not present

## 2021-07-23 DIAGNOSIS — S6991XA Unspecified injury of right wrist, hand and finger(s), initial encounter: Secondary | ICD-10-CM | POA: Diagnosis not present

## 2021-07-23 MED ORDER — DOXYCYCLINE HYCLATE 100 MG PO TABS
100.0000 mg | ORAL_TABLET | Freq: Once | ORAL | Status: AC
  Administered 2021-07-23: 100 mg via ORAL
  Filled 2021-07-23: qty 1

## 2021-07-23 MED ORDER — LIDOCAINE-EPINEPHRINE-TETRACAINE (LET) TOPICAL GEL
3.0000 mL | Freq: Once | TOPICAL | Status: AC
  Administered 2021-07-23: 3 mL via TOPICAL
  Filled 2021-07-23: qty 3

## 2021-07-23 MED ORDER — OXYCODONE-ACETAMINOPHEN 5-325 MG PO TABS
1.0000 | ORAL_TABLET | Freq: Once | ORAL | Status: AC
  Administered 2021-07-23: 1 via ORAL
  Filled 2021-07-23: qty 1

## 2021-07-23 MED ORDER — OXYCODONE HCL 5 MG PO TABS: 5.0000 mg | ORAL_TABLET | Freq: Three times a day (TID) | ORAL | 0 refills | Status: DC | PRN

## 2021-07-23 MED ORDER — METRONIDAZOLE 500 MG PO TABS
500.0000 mg | ORAL_TABLET | Freq: Once | ORAL | Status: AC
  Administered 2021-07-23: 500 mg via ORAL
  Filled 2021-07-23: qty 1

## 2021-07-23 MED ORDER — DOXYCYCLINE MONOHYDRATE 100 MG PO TABS: 100.0000 mg | ORAL_TABLET | Freq: Two times a day (BID) | ORAL | 0 refills | Status: AC

## 2021-07-23 MED ORDER — LIDOCAINE HCL (PF) 1 % IJ SOLN
5.0000 mL | Freq: Once | INTRAMUSCULAR | Status: AC
  Administered 2021-07-23: 5 mL
  Filled 2021-07-23: qty 5

## 2021-07-23 MED ORDER — METRONIDAZOLE 500 MG PO TABS: 500.0000 mg | ORAL_TABLET | Freq: Three times a day (TID) | ORAL | 0 refills | Status: AC

## 2021-07-23 MED ORDER — TETANUS-DIPHTH-ACELL PERTUSSIS 5-2.5-18.5 LF-MCG/0.5 IM SUSY
0.5000 mL | PREFILLED_SYRINGE | Freq: Once | INTRAMUSCULAR | Status: AC
  Administered 2021-07-23: 0.5 mL via INTRAMUSCULAR
  Filled 2021-07-23: qty 0.5

## 2021-07-23 NOTE — Discharge Instructions (Signed)
Please keep bulky dressing on the right hand until tomorrow morning.  Tomorrow morning you may shower and get the laceration/puncture wound sites wet but do not submerge underwater.  Cleanse wounds with peroxide or alcohol and apply thin layer of antibiotic ointment and a Band-Aid to keep wounds covered during the day.  You may use the Ace wrap to the right hand to help reduce swelling along with ice.  Continue with NSAID prescribed for your back.  You may use oxycodone as needed for severe pain.  Take antibiotics as prescribed for 5 days. ?

## 2021-07-23 NOTE — ED Notes (Signed)
Pt given ice pack as requested. Pt offered wheelchair but declined. ?

## 2021-07-23 NOTE — ED Notes (Signed)
See triage note. Pt in with injury to R hand and L upper arm because of dog fight. Pt tearful due to R hand pain and learning that her dog did not survive the fight. Visitor at bedside. Pt's wounds covered with gauze from triage. Pt able to move arm, hands and fingers appropriately bilaterally but not without increase in pain.  ?

## 2021-07-23 NOTE — ED Provider Notes (Signed)
?[facility] EMERGENCY DEPARTMENT ?Provider Note ? ? ?CSN: [id number] ?Arrival date & time: 07/23/21  1925 ? ?  ? ?History ? ?Chief Complaint  ?Patient presents with  ? Animal Bite  ? ? ?Belinda Martin is a 46 y.o. female presents to the emergency department for animal bite.  Patient was bit by her husband's dog's, she was trying to rescue her dog who was being attacked.  Patient has a laceration to the right hand along the first webspace and along the thenar eminence along with a laceration/puncture wound to the left elbow.  Patient has multiple abrasions throughout the upper arms, left face with no other facial lacerations.  Tetanus is not up-to-date.  Animal control has been notified and dog can be quarantined. ? ?HPI ? ?  ? ?Home Medications ?Prior to Admission medications   ?Medication Sig Start Date End Date Taking? Authorizing Provider  ?doxycycline (ADOXA) 100 MG tablet Take 1 tablet (100 mg total) by mouth 2 (two) times daily for 5 days. 07/23/21 07/28/21 Yes Duanne Guess, PA-C  ?metroNIDAZOLE (FLAGYL) 500 MG tablet Take 1 tablet (500 mg total) by mouth 3 (three) times daily for 5 days. 07/23/21 07/28/21 Yes Duanne Guess, PA-C  ?oxyCODONE (ROXICODONE) 5 MG immediate release tablet Take 1 tablet (5 mg total) by mouth every 8 (eight) hours as needed. 07/23/21 07/23/22 Yes Duanne Guess, PA-C  ?Azelaic Acid 15 % gel After skin is thoroughly washed and patted dry, gently but thoroughly massage a thin film of azelaic acid cream into the affected area twice daily, in the morning and evening. 10/01/20   Moye, [place], MD  ?azithromycin (ZITHROMAX) 250 MG tablet 2 tabs poqday1, 1 tab poqday 2-5 05/04/21   [name], MD  ?Biotin 10 MG TABS Take by mouth.    [provider]  ?Drospirenone (SLYND) 4 MG TABS Take 1 tablet by mouth daily. 12/12/35   Copland, Deirdre Evener, PA-C  ?fexofenadine (ALLEGRA) 180 MG tablet Take 180 mg by mouth daily.    [provider]  ?folic acid  (FOLVITE) 1 MG tablet Take 3 mg by mouth daily.    [provider]  ?Glucosamine 500 MG CAPS Take 1,000 mg by mouth daily.    [provider]  ?hydrocortisone 2.5 % cream Apply topically 2 (two) times daily as needed (Rash). To area at abdomen for up to 2 weeks. Avoid applying to face, groin, and axilla. Use as directed. Risk of skin atrophy with long-term use reviewed. 07/02/20   Moye, [place], MD  ?Ibuprofen-Famotidine (DUEXIS) 800-26.6 MG TABS Take 1 tablet by mouth as needed.    [provider]  ?Multiple Vitamins-Calcium (ONE-A-DAY WOMENS FORMULA PO) Take 1 tablet by mouth daily.    [provider]  ?Omega-3 Fatty Acids (FISH OIL) 1000 MG CAPS Take 2 capsules by mouth daily.    [provider]  ?tiZANidine (ZANAFLEX) 4 MG tablet Take 1 tablet (4 mg total) by mouth every 6 (six) hours as needed for muscle spasms. 03/01/21   Eulogio Bear, NP  ?TRIAMCINOLONE ACETONIDE, TOP, (TRIANEX) 0.05 % OINT Apply twice daily to hands for up to 2 weeks then weekends only. Avoid applying to face, groin, and axilla. Use as directed. Risk of skin atrophy with long-term use reviewed. 12/31/20   Moye, [place], MD  ?vitamin B-12 (CYANOCOBALAMIN) 500 MCG tablet Take 1,000 mcg by mouth daily.    [provider]  ?   ? ?Allergies    ?Amoxicillin   ? ?  Review of Systems   ?Review of Systems ? ?Physical Exam ?Updated Vital Signs ?BP 134/71 (BP Location: Right Arm)   Pulse 84   Temp 98.3 ?F (36.8 ?C) (Oral)   Resp 18   Ht '5\' 7"'$  (1.702 m)   Wt 104.3 kg   SpO2 99%   BMI 36.02 kg/m?  ?Physical Exam ?Constitutional:   ?   General: She is not in acute distress. ?   Appearance: She is well-developed.  ?HENT:  ?   Head: Normocephalic.  ?   Comments: 3 small abrasions to the left cheek and left upper lip.  No lacerations or deep puncture wounds. ?   Nose: Nose normal.  ?   Mouth/Throat:  ?   Mouth: Mucous membranes are moist.  ?Eyes:  ?   General:     ?   Right eye: No discharge.      ?   Left eye: No discharge.  ?   Conjunctiva/sclera: Conjunctivae normal.  ?Cardiovascular:  ?   Rate and Rhythm: Normal rate.  ?Pulmonary:  ?   Effort: Pulmonary effort is normal.  ?   Breath sounds: Normal breath sounds.  ?Musculoskeletal:     ?   General: No deformity. Normal range of motion.  ?   Cervical back: Normal range of motion.  ?   Comments: Left elbow with posterior distal humeral laceration, 2 cm, no visible palpable foreign body.  No significant swelling.  Full active flexion extension of left elbow.  Neurovascular intact in left upper extremity.  Small of superficial abrasions to the inner aspect of the left elbow. ? ?Right hand with 1 cm laceration in the first webspace between the thumb and index finger.  On the dorsal aspect of the thumb there is a 2.5 cm laceration/puncture wound, patient has full range of motion of thumb and digits with flexion extension.  Sensation is intact distally.  Superficial abrasions noted throughout the right arm.  ?Skin: ?   General: Skin is warm and dry.  ?Neurological:  ?   General: No focal deficit present.  ?   Mental Status: She is alert and oriented to person, place, and time.  ?   Deep Tendon Reflexes: Reflexes are normal and symmetric.  ?Psychiatric:     ?   Behavior: Behavior normal.     ?   Thought Content: Thought content normal.  ? ? ?ED Results / Procedures / Treatments   ?Labs ?(all labs ordered are listed, but only abnormal results are displayed) ?Labs Reviewed - No data to display ? ?EKG ?None ? ?Radiology ?DG Hand Complete Right ? ?Result Date: 07/23/2021 ?CLINICAL DATA:  Dog bite to right hand EXAM: RIGHT HAND - COMPLETE 3+ VIEW COMPARISON:  None. FINDINGS: Soft tissue swelling in the hypothenar eminence. No radiopaque foreign bodies. Small amount of soft tissue gas. No acute bony abnormality. Specifically, no fracture, subluxation, or dislocation. IMPRESSION: No acute bony abnormality or radiopaque foreign body. Electronically Signed   By: Rolm Baptise M.D.   On: 07/23/2021 21:25   ? ?Procedures ?[name].Laceration Repair ? ?Date/Time: 07/24/2021 12:03 AM ?Performed by: Duanne Guess, PA-C ?Authorized by: Duanne Guess, PA-C  ? ?Consent:  ?  Consent obtained:  Verbal ?  Consent given by:  Patient ?Anesthesia:  ?  Anesthesia method:  Topical application and local infiltration ?  Local anesthetic:  Procaine 1% w/o epi ?Laceration details:  ?  Location: Left elbow right hand. ?  Wound length (cm): Left elbow 2  cm, right hand 2.5+1 cm. ?Pre-procedure details:  ?  Preparation:  Patient was prepped and draped in usual sterile fashion ?Exploration:  ?  Imaging obtained: x-ray   ?  Imaging outcome: foreign body not noted   ?  Wound exploration: wound explored through full range of motion and entire depth of wound visualized   ?  Contaminated: yes   ?Treatment:  ?  Area cleansed with:  Povidone-iodine and saline ?  Amount of cleaning:  Standard ?  Irrigation solution:  Sterile water ?  Irrigation method:  Pressure wash ?  Visualized foreign bodies/material removed: no   ?Skin repair:  ?  Repair method:  Sutures ?  Suture size:  5-0 ?  Suture material:  Nylon ?  Suture technique:  Simple interrupted ?  Number of sutures: 2 left elbow, 4 right hand. ?Approximation:  ?  Approximation:  Loose ?Repair type:  ?  Repair type:  Simple ?Post-procedure details:  ?  Dressing:  Non-adherent dressing and bulky dressing ?  Procedure completion:  Tolerated  ? ? ?Medications Ordered in ED ?Medications  ?oxyCODONE-acetaminophen (PERCOCET/ROXICET) 5-325 MG per tablet 1 tablet (1 tablet Oral Given 07/23/21 2023)  ?Tdap (BOOSTRIX) injection 0.5 mL (0.5 mLs Intramuscular Given 07/23/21 2024)  ?lidocaine-EPINEPHrine-tetracaine (LET) topical gel (3 mLs Topical Given 07/23/21 2123)  ?lidocaine-EPINEPHrine-tetracaine (LET) topical gel (3 mLs Topical Given 07/23/21 2123)  ?oxyCODONE-acetaminophen (PERCOCET/ROXICET) 5-325 MG per tablet 1 tablet (1 tablet Oral Given 07/23/21 2122)  ?doxycycline  (VIBRA-TABS) tablet 100 mg (100 mg Oral Given 07/23/21 2231)  ?metroNIDAZOLE (FLAGYL) tablet 500 mg (500 mg Oral Given 07/23/21 2231)  ?lidocaine (PF) (XYLOCAINE) 1 % injection 5 mL (5 mLs Infiltration Given 07/23/21 2

## 2021-07-23 NOTE — ED Notes (Signed)
Pt unable to sign for d/c paperwork as topaz frozen and injured hand is pt's witting hand.  ?

## 2021-07-23 NOTE — ED Triage Notes (Signed)
Presents to ED by POV for multiple dog bites. Trying to break up fight between her dog and another dog in the home (german shepherd). Puncture wounds to (R)hand, (L)hand, (L)elbow area. Tearful in triage. Unsure if dog's shots up to date.  ?

## 2021-07-27 ENCOUNTER — Other Ambulatory Visit: Payer: Self-pay | Admitting: Obstetrics and Gynecology

## 2021-07-27 DIAGNOSIS — Z3041 Encounter for surveillance of contraceptive pills: Secondary | ICD-10-CM

## 2021-07-29 MED ORDER — SLYND 4 MG PO TABS
1.0000 | ORAL_TABLET | Freq: Every day | ORAL | 0 refills | Status: DC
Start: 2021-07-29 — End: 2021-08-31

## 2021-07-29 NOTE — Addendum Note (Signed)
Addended by: Drenda Freeze on: 07/29/2021 10:49 AM ? ? Modules accepted: Orders ? ?

## 2021-07-30 ENCOUNTER — Ambulatory Visit: Payer: BC Managed Care – PPO | Admitting: Family Medicine

## 2021-07-30 DIAGNOSIS — Z4802 Encounter for removal of sutures: Secondary | ICD-10-CM

## 2021-07-30 NOTE — Progress Notes (Signed)
? ?Subjective:  ? ? Patient ID: Belinda Martin, female    DOB: 08-27-1975, 46 y.o.   MRN: [id number] ? ?1 week ago, the patient's dog got into a fight with her husband's dog.  She was trying to break up a fight.  She suffered a laceration to the thenar eminence on her right hand.  This has approximately 4 sutures in.  She also suffered a small puncture wound to the webspace between her thumb and her index finger that was closed with 1 suture.  She also suffered a small laceration to her dorsal left elbow just above the olecranon process that is closed with 2 sutures.  She is here today for suture removal. ? ?Past Medical History:  ?Diagnosis Date  ? Asthma   ? Allergic  ? DDD (degenerative disc disease), lumbar   ? Depression   ? as adolescent  ? Fatigue   ? GERD (gastroesophageal reflux disease)   ? Hair loss   ? excessive  ? History of abnormal cervical Pap smear 2009  ? ASCUS with positive HRHPV; colpo:HPV effect  ? History of echocardiogram 10/10  ? EF 65-70%, normal valves  ? History of genital warts 1998  ? 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 Irritable bowel syndrome   ? Obesity   ? Pseudotumor cerebri   ? Sleep disorder   ? due to excessive limb movement  ? ?Past Surgical History:  ?Procedure Laterality Date  ? Electrocautery of condyloma accuminata  09/2007  ? Dr Rayford Halsted  ? NASAL SINUS SURGERY    ? TONSILLECTOMY    ? TRANSTHORACIC ECHOCARDIOGRAM  10/10  ? EF 65-70%, normal valves  ? ?Current Outpatient Medications on File Prior to Visit  ?Medication Sig Dispense Refill  ? Azelaic Acid 15 % gel After skin is thoroughly washed and patted dry, gently but thoroughly massage a thin film of azelaic acid cream into the affected area twice daily, in the morning and evening. 50 g 2  ? azithromycin (ZITHROMAX) 250 MG tablet 2 tabs poqday1, 1 tab poqday 2-5 6 tablet 0  ? Biotin 10 MG TABS Take by mouth.    ? Drospirenone (SLYND) 4 MG TABS Take 1 tablet by mouth daily. 84 tablet 0  ? fexofenadine (ALLEGRA) 180 MG tablet  Take 180 mg by mouth daily.    ? folic acid (FOLVITE) 1 MG tablet Take 3 mg by mouth daily.    ? Glucosamine 500 MG CAPS Take 1,000 mg by mouth daily.    ? hydrocortisone 2.5 % cream Apply topically 2 (two) times daily as needed (Rash). To area at abdomen for up to 2 weeks. Avoid applying to face, groin, and axilla. Use as directed. Risk of skin atrophy with long-term use reviewed. 30 g 1  ? Ibuprofen-Famotidine (DUEXIS) 800-26.6 MG TABS Take 1 tablet by mouth as needed.    ? Multiple Vitamins-Calcium (ONE-A-DAY WOMENS FORMULA PO) Take 1 tablet by mouth daily.    ? Omega-3 Fatty Acids (FISH OIL) 1000 MG CAPS Take 2 capsules by mouth daily.    ? oxyCODONE (ROXICODONE) 5 MG immediate release tablet Take 1 tablet (5 mg total) by mouth every 8 (eight) hours as needed. 15 tablet 0  ? tiZANidine (ZANAFLEX) 4 MG tablet Take 1 tablet (4 mg total) by mouth every 6 (six) hours as needed for muscle spasms. 30 tablet 1  ? TRIAMCINOLONE ACETONIDE, TOP, (TRIANEX) 0.05 % OINT Apply twice daily to hands for up to 2 weeks then weekends only.  Avoid applying to face, groin, and axilla. Use as directed. Risk of skin atrophy with long-term use reviewed. 110 g 1  ? vitamin B-12 (CYANOCOBALAMIN) 500 MCG tablet Take 1,000 mcg by mouth daily.    ? ?No current facility-administered medications on file prior to visit.  ? ?Allergies  ?Allergen Reactions  ? Amoxicillin   ?  REACTION: hives  ? ?Social History  ? ?Socioeconomic History  ? Marital status: Married  ?  Spouse name: Not on file  ? Number of children: 0  ? Years of education: Not on file  ? Highest education level: Not on file  ?Occupational History  ? Occupation: Mental health assessments  ?  Comment: [employer]  ?Tobacco Use  ? Smoking status: Never  ? Smokeless tobacco: Never  ?Vaping Use  ? Vaping Use: Never used  ?Substance and Sexual Activity  ? Alcohol use: Yes  ?  Comment: rare  ? Drug use: No  ? Sexual activity: Yes  ?  Partners: Male  ?  Birth control/protection:  Pill  ?Other Topics Concern  ? Not on file  ?Social History Narrative  ? Remarried; lives in [place]  ? 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   ?   ? She has lost about 90 pounds in the last year-dietary modification and try to increase exercise level.  ? Her new husband has encouraged her to increase her exercise => she walks most days.  They go hiking on the weekends.    ? ?Social Determinants of Health  ? ?Financial Resource Strain: Not on file  ?Food Insecurity: Not on file  ?Transportation Needs: Not on file  ?Physical Activity: Not on file  ?Stress: Not on file  ?Social Connections: Not on file  ?Intimate Partner Violence: Not on file  ? ? ? ? ?Review of Systems  ?Cardiovascular:  Positive for palpitations.  ?All other systems reviewed and are negative. ? ?   ?Objective:  ? Physical Exam ?Vitals reviewed.  ?Constitutional:   ?   Appearance: She is well-developed.  ?Cardiovascular:  ?   Rate and Rhythm: Normal rate and regular rhythm.  ?   Heart sounds: Normal heart sounds.  ?Pulmonary:  ?   Effort: Pulmonary effort is normal.  ?   Breath sounds: Normal breath sounds.  ?Musculoskeletal:  ?   Left elbow: Laceration present.  ?   Right hand: Laceration and tenderness present. No swelling or bony tenderness. Normal range of motion. Normal strength. Decreased sensation.  ? ?Patient has decreased sensation over the thenar eminence of the right thumb.  She also has some tenderness to palpation around the laceration but there is no erythema or purulence.  There are some slight erythema to the laceration on her left elbow.  This is only roughly 1 cm in diameter.  Therefore I do not feel that require systemic antibiotics but I did treat with topical Neosporin.  I removed all the sutures without difficulty.  Reinforced the wound with Steri-Strips. ? ? ? ?   ?Assessment & Plan:  ? ?Visit for suture removal ?I removed the sutures without difficulty.  7 sutures in total.  I reinforced all the  wounds with Steri-Strips.  There is no evidence of cellulitis in the right hand.  There is some slight numbness over the thenar eminence suspect that this will improve with time as the swelling goes down.  I treated the slight erythema around the laceration on the left elbow with Neosporin.  Monitor that area for any progression. ?

## 2021-08-30 NOTE — Progress Notes (Unsigned)
PCP:  Belinda Frizzle, MD   No chief complaint on file.    HPI:      [name] is a 46 y.o. G1P0010 whose LMP was No LMP recorded. (Menstrual status: Oral contraceptives)., presents today for her annual examination.  Her menses are Q3-5 wks now on POPs, lasting 5-7 days, mod flow (heavier flow, changing products Q2-3 hrs past few cycles).  Dysmenorrhea mild to mod, improved with NSAIDs prn. She does not have intermenstrual bleeding.  Sex activity: single partner, contraception - oral progesterone-only contraceptive. On POPs since last yr; hx of visual changes with headaches in past. Did depo in HS with wt gain and worsening migraines.  Last Pap: 06/10/19 Results were: no abnormalities /neg HPV DNA   Pt with severe fatigue and being treated for chronic fatigue syndrome. MD wants to start provigil which will decrease effectiveness of POPs, if she gets cardiac clearance to take it. Has had issues with tachycardia and is seeing cardio; did Holter monitor recently and gets results tomorrow. Pt interested in different BC if start provigil.  Last mammogram: 12/18/20  Results were cat 3 LT breast; due for bilat dx mamm 9/23. There is no FH of breast cancer. There is no FH of ovarian cancer. The patient does self-breast exams.  Tobacco use: The patient denies current or previous tobacco use. Alcohol use: none No drug use.  Exercise: very active; has lost 100#  She does get adequate calcium but not Vitamin D in her diet.   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    Past Surgical History:  Procedure Laterality Date   Electrocautery of condyloma accuminata  09/2007   Dr Rayford Halsted   NASAL SINUS SURGERY     TONSILLECTOMY     TRANSTHORACIC ECHOCARDIOGRAM  10/10   EF 65-70%, normal valves    Family History  Problem Relation Age of Onset   Arrhythmia Mother    Heart murmur Mother    Endometriosis Mother    Fibromyalgia Mother    Irritable bowel syndrome Mother    Deep vein thrombosis Father        MTHFR homozygous   Atrial fibrillation Father    Hypertension Father    Heart failure Paternal Grandfather        CHF;   Stomach cancer Paternal Grandfather    Crohn's disease Maternal Grandmother    Diabetes Maternal Grandmother    Heart murmur Paternal Uncle    Coronary artery disease Neg Hx        premature CAD   Breast cancer Neg Hx     Social History   Socioeconomic History   Marital status: Married    Spouse name: Not on file   Number of children: 0   Years of education: Not on file   Highest education level: Not on file  Occupational History   Occupation: Mental health assessments    Comment: [employer]  Tobacco Use   Smoking status: Never   Smokeless tobacco: Never  Vaping Use   Vaping Use: Never used  Substance and Sexual Activity   Alcohol use: Yes    Comment: rare   Drug use: No   Sexual activity: Yes    Partners: Male    Birth control/protection: Pill  Other Topics Concern   Not on file  Social History Narrative   Remarried; lives in [place]   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       She has lost about 90 pounds in the last year-dietary modification and try to increase exercise level.   Her new husband has encouraged her to increase her exercise => she walks most days.  They go hiking on the weekends.     Social Determinants of Health   Financial Resource Strain: Not on file  Food  Insecurity: Not on file  Transportation Needs: Not on file  Physical Activity: Not on file  Stress: Not on file  Social Connections: Not on file  Intimate Partner Violence: Not on file     Current Outpatient Medications:    Azelaic Acid 15 % gel, After skin is thoroughly washed and patted dry, gently but thoroughly massage a thin film of azelaic acid cream into the affected area twice daily, in the morning and evening., Disp: 50 g, Rfl: 2   azithromycin (ZITHROMAX) 250 MG tablet, 2 tabs poqday1, 1 tab poqday 2-5, Disp: 6 tablet, Rfl: 0   Biotin 10 MG TABS, Take by mouth., Disp: , Rfl:    Drospirenone (SLYND) 4 MG TABS, Take 1 tablet by mouth daily., Disp: 84 tablet, Rfl: 0   fexofenadine (ALLEGRA) 180 MG tablet, Take 180 mg by mouth daily., Disp: , Rfl:    folic acid (FOLVITE) 1 MG tablet, Take 3 mg by mouth daily., Disp: , Rfl:    Glucosamine 500 MG CAPS, Take 1,000 mg by mouth daily., Disp: , Rfl:    hydrocortisone 2.5 % cream, Apply topically 2 (two) times daily as needed (Rash). To area at abdomen for up to 2 weeks. Avoid applying to face, groin, and axilla. Use as directed. Risk of skin atrophy with long-term use reviewed., Disp: 30 g, Rfl: 1   Ibuprofen-Famotidine (DUEXIS) 800-26.6 MG TABS, Take 1 tablet by mouth as needed., Disp: , Rfl:    Multiple Vitamins-Calcium (ONE-A-DAY WOMENS FORMULA PO), Take 1 tablet by mouth daily., Disp: , Rfl:    Omega-3 Fatty Acids (FISH OIL) 1000 MG CAPS, Take 2 capsules by mouth daily., Disp: , Rfl:    oxyCODONE (ROXICODONE) 5 MG immediate release tablet, Take 1 tablet (5 mg total) by mouth every 8 (eight) hours as needed., Disp: 15 tablet, Rfl: 0   tiZANidine (ZANAFLEX) 4 MG tablet, Take 1 tablet (4 mg total) by mouth every 6 (six) hours as needed for muscle spasms., Disp: 30 tablet, Rfl: 1   TRIAMCINOLONE ACETONIDE, TOP, (TRIANEX) 0.05 % OINT, Apply twice daily to hands for up to 2 weeks then weekends only. Avoid applying to face, groin, and axilla. Use  as directed. Risk of skin atrophy with long-term use reviewed., Disp: 110 g, Rfl: 1   vitamin B-12 (CYANOCOBALAMIN) 500 MCG tablet, Take 1,000 mcg by mouth daily., Disp: , Rfl:      ROS:  Review of Systems  Constitutional:  Positive for fatigue. Negative for fever and unexpected weight  change.  Respiratory:  Negative for cough, shortness of breath and wheezing.   Cardiovascular:  Negative for chest pain, palpitations and leg swelling.  Gastrointestinal:  Negative for blood in stool, constipation, diarrhea, nausea and vomiting.  Endocrine: Negative for cold intolerance, heat intolerance and polyuria.  Genitourinary:  Negative for dyspareunia, dysuria, flank pain, frequency, genital sores, hematuria, menstrual problem, pelvic pain, urgency, vaginal bleeding, vaginal discharge and vaginal pain.  Musculoskeletal:  Negative for back pain, joint swelling and myalgias.  Skin:  Negative for rash.  Neurological:  Positive for numbness. Negative for dizziness, syncope, light-headedness and headaches.  Hematological:  Negative for adenopathy.  Psychiatric/Behavioral:  Negative for agitation, confusion, sleep disturbance and suicidal ideas. The patient is not nervous/anxious.  BREAST: No symptoms   Objective: There were no vitals taken for this visit.   Physical Exam Constitutional:      Appearance: She is well-developed.  Genitourinary:     Vulva normal.     Right Labia: No rash, tenderness or lesions.    Left Labia: No tenderness, lesions or rash.    No vaginal discharge, erythema or tenderness.      Right Adnexa: not tender and no mass present.    Left Adnexa: not tender and no mass present.    No cervical friability or polyp.     Uterus is not enlarged or tender.  Breasts:    Right: No mass, nipple discharge, skin change or tenderness.     Left: No mass, nipple discharge, skin change or tenderness.  Neck:     Thyroid: No thyromegaly.  Cardiovascular:     Rate and Rhythm: Normal  rate and regular rhythm.     Heart sounds: Normal heart sounds. No murmur heard. Pulmonary:     Effort: Pulmonary effort is normal.     Breath sounds: Normal breath sounds.  Abdominal:     Palpations: Abdomen is soft.     Tenderness: There is no abdominal tenderness. There is no guarding or rebound.  Musculoskeletal:        General: Normal range of motion.     Cervical back: Normal range of motion.  Lymphadenopathy:     Cervical: No cervical adenopathy.  Neurological:     General: No focal deficit present.     Mental Status: She is alert and oriented to person, place, and time.     Cranial Nerves: No cranial nerve deficit.  Skin:    General: Skin is warm and dry.  Psychiatric:        Mood and Affect: Mood normal.        Behavior: Behavior normal.        Thought Content: Thought content normal.        Judgment: Judgment normal.  Vitals reviewed.    Results: No results found for this or any previous visit (from the past 24 hour(s)).   Assessment/Plan: Encounter for annual routine gynecological examination  Encounter for surveillance of contraceptive pills--pt on POPs. Discussed prog only BC options if starts provigil. Pt interested in [place]. Will research and f/u with menses for insertion if desires.   Encounter for screening mammogram for malignant neoplasm of breast - Plan: US BREAST LTD UNI LEFT INC AXILLA, MM DIAG BREAST TOMO BILATERAL; pt to sched dx mammo and u/s.   Abnormal mammogram of left breast - Plan: US BREAST LTD UNI LEFT INC AXILLA, MM DIAG BREAST TOMO BILATERAL; pt to sched u/s.  Irregular menses - Plan: POCT urine pregnancy; neg UPT. Most likely  due to POPs. Can change to slynd or do IUD with next menses. F/u prn.    GYN counsel breast self exam, mammography screening, family planning choices, adequate intake of calcium and vitamin D, diet and exercise     F/U  No follow-ups on file.  Reinhardt Licausi B. Fredy Gladu, PA-C 08/30/2021 7:31 PM

## 2021-08-31 ENCOUNTER — Other Ambulatory Visit: Payer: Self-pay | Admitting: Obstetrics and Gynecology

## 2021-08-31 ENCOUNTER — Ambulatory Visit (INDEPENDENT_AMBULATORY_CARE_PROVIDER_SITE_OTHER): Payer: BC Managed Care – PPO | Admitting: Obstetrics and Gynecology

## 2021-08-31 ENCOUNTER — Encounter: Payer: Self-pay | Admitting: Obstetrics and Gynecology

## 2021-08-31 VITALS — BP 120/70 | Ht 66.0 in | Wt 242.0 lb

## 2021-08-31 DIAGNOSIS — R928 Other abnormal and inconclusive findings on diagnostic imaging of breast: Secondary | ICD-10-CM

## 2021-08-31 DIAGNOSIS — Z01419 Encounter for gynecological examination (general) (routine) without abnormal findings: Secondary | ICD-10-CM | POA: Diagnosis not present

## 2021-08-31 DIAGNOSIS — Z3041 Encounter for surveillance of contraceptive pills: Secondary | ICD-10-CM | POA: Diagnosis not present

## 2021-08-31 DIAGNOSIS — Z1231 Encounter for screening mammogram for malignant neoplasm of breast: Secondary | ICD-10-CM | POA: Diagnosis not present

## 2021-08-31 DIAGNOSIS — N643 Galactorrhea not associated with childbirth: Secondary | ICD-10-CM

## 2021-08-31 DIAGNOSIS — N6489 Other specified disorders of breast: Secondary | ICD-10-CM

## 2021-08-31 MED ORDER — SLYND 4 MG PO TABS: 1.0000 | ORAL_TABLET | Freq: Every day | ORAL | 3 refills | Status: DC

## 2021-08-31 NOTE — Patient Instructions (Addendum)
I value your feedback and you entrusting us with your care. If you get a [facility] patient survey, I would appreciate you taking the time to let us know about your experience today. Thank you!  [facility]: [phone]      

## 2021-09-01 LAB — PROLACTIN: Prolactin: 12.2 ng/mL (ref 4.8–23.3)

## 2021-10-12 ENCOUNTER — Telehealth: Payer: Self-pay

## 2021-10-12 NOTE — Telephone Encounter (Signed)
Pt called in asking for a copy immunization records. Please call when available for pick up.  Cb#:[phone]

## 2021-10-13 NOTE — Telephone Encounter (Signed)
Print Immunization record and put up front for pt to come pick.   Please call pt.

## 2021-10-15 ENCOUNTER — Other Ambulatory Visit: Payer: Self-pay | Admitting: Obstetrics and Gynecology

## 2021-10-15 ENCOUNTER — Encounter: Payer: Self-pay | Admitting: Obstetrics and Gynecology

## 2021-10-15 DIAGNOSIS — Z309 Encounter for contraceptive management, unspecified: Secondary | ICD-10-CM

## 2021-10-15 DIAGNOSIS — H5213 Myopia, bilateral: Secondary | ICD-10-CM | POA: Diagnosis not present

## 2021-10-15 DIAGNOSIS — Z3041 Encounter for surveillance of contraceptive pills: Secondary | ICD-10-CM

## 2021-10-27 ENCOUNTER — Ambulatory Visit: Payer: BC Managed Care – PPO | Admitting: Dermatology

## 2021-11-02 NOTE — Telephone Encounter (Signed)
Patient returned my call. She states that she has already picked up. I will remove and shred the one up front.

## 2021-11-02 NOTE — Telephone Encounter (Signed)
LVM asking pt to return my call.   I am not sure if this was printed or not so I have printed and placed up front. If patient returns call please advise patient that it is ready for pick up if she hasn't already done so.

## 2021-11-04 ENCOUNTER — Ambulatory Visit: Payer: BC Managed Care – PPO | Admitting: Dermatology

## 2021-11-15 MED ORDER — SLYND 4 MG PO TABS: 1.0000 | ORAL_TABLET | Freq: Every day | ORAL | 0 refills | Status: AC

## 2021-11-15 NOTE — Telephone Encounter (Signed)
Samples of this drug were given to the patient, quantity 28, Lot Number [id number].

## 2021-12-20 ENCOUNTER — Encounter: Payer: Self-pay | Admitting: Family Medicine

## 2021-12-20 ENCOUNTER — Ambulatory Visit: Payer: 59 | Admitting: Family Medicine

## 2021-12-20 VITALS — BP 120/80 | HR 87 | Temp 98.7°F | Ht 66.0 in | Wt 251.0 lb

## 2021-12-20 DIAGNOSIS — J209 Acute bronchitis, unspecified: Secondary | ICD-10-CM | POA: Diagnosis not present

## 2021-12-20 MED ORDER — ALBUTEROL SULFATE HFA 108 (90 BASE) MCG/ACT IN AERS: 2.0000 | INHALATION_SPRAY | Freq: Four times a day (QID) | RESPIRATORY_TRACT | 0 refills | Status: DC | PRN

## 2021-12-20 NOTE — Progress Notes (Signed)
   Subjective:    Patient ID: Belinda Martin, female    DOB: 24-Feb-1976, 46 y.o.   MRN: [id number]  Sinusitis This is a new problem. The current episode started in the past 7 days. The problem has been gradually worsening since onset. There has been no fever. Associated symptoms include coughing, diaphoresis, headaches, shortness of breath and sneezing. Past treatments include oral decongestants. The treatment provided no relief.   Negative COVID at home, tried Flonase and Sudafed  Review of Systems  Constitutional:  Positive for diaphoresis.  HENT:  Positive for sneezing.   Respiratory:  Positive for cough and shortness of breath.   Neurological:  Positive for headaches.       Objective:   Physical Exam Constitutional:      Appearance: Normal appearance.  HENT:     Right Ear: Tympanic membrane, ear canal and external ear normal.     Left Ear: Tympanic membrane, ear canal and external ear normal.     Nose: Nose normal.     Mouth/Throat:     Mouth: Mucous membranes are moist.     Pharynx: Oropharynx is clear.  Eyes:     Conjunctiva/sclera: Conjunctivae normal.     Pupils: Pupils are equal, round, and reactive to light.  Cardiovascular:     Rate and Rhythm: Normal rate and regular rhythm.  Pulmonary:     Effort: Pulmonary effort is normal.     Breath sounds: Normal breath sounds.  Musculoskeletal:     Cervical back: Normal range of motion and neck supple.  Lymphadenopathy:     Cervical: No cervical adenopathy.  Skin:    General: Skin is warm and dry.  Neurological:     Mental Status: She is alert.           Assessment & Plan:  Acute bronchitis, unspecified organism Acute for 3 days. Negative at home COVID test. Reassured patient that findings are consistent with viral upper respiratory infection and explained lack of efficacy of antibiotics against viruses. Discussed expected course and features suggestive of secondary bacterial infection. Continue supportive care,  increase fluid intake, and rest. Encouraged OTC guaifenesin or delsym. Return to clinic in 7 days if no improvement. Given history of asthma ordered Albuterol PRN for wheezing and shortness of breath. Uncomplicated asthma, unspecified asthma severity, unspecified whether persistent

## 2021-12-21 ENCOUNTER — Encounter: Payer: Self-pay | Admitting: Family Medicine

## 2021-12-21 ENCOUNTER — Other Ambulatory Visit: Payer: Self-pay | Admitting: Primary Care

## 2021-12-21 NOTE — Telephone Encounter (Signed)
Pls advice  

## 2021-12-24 ENCOUNTER — Other Ambulatory Visit: Payer: Self-pay | Admitting: Family Medicine

## 2021-12-24 MED ORDER — HYDROCODONE BIT-HOMATROP MBR 5-1.5 MG/5ML PO SOLN: 5.0000 mL | Freq: Three times a day (TID) | ORAL | 0 refills | Status: DC | PRN

## 2022-01-12 ENCOUNTER — Ambulatory Visit (INDEPENDENT_AMBULATORY_CARE_PROVIDER_SITE_OTHER): Payer: 59

## 2022-01-12 ENCOUNTER — Other Ambulatory Visit (HOSPITAL_COMMUNITY)
Admission: RE | Admit: 2022-01-12 | Discharge: 2022-01-12 | Disposition: A | Discharge status: Home / Self Care | Payer: 59 | Source: Ambulatory Visit | Attending: Obstetrics and Gynecology | Admitting: Obstetrics and Gynecology

## 2022-01-12 VITALS — BP 121/80 | HR 72 | Ht 66.0 in | Wt 253.0 lb

## 2022-01-12 DIAGNOSIS — N898 Other specified noninflammatory disorders of vagina: Secondary | ICD-10-CM | POA: Insufficient documentation

## 2022-01-12 MED ORDER — FLUCONAZOLE 150 MG PO TABS: 150.0000 mg | ORAL_TABLET | Freq: Once | ORAL | 0 refills | Status: AC

## 2022-01-12 NOTE — Progress Notes (Signed)
Pt came in to be seen for vaginal itching. Self swabbed and diflucan sent in. Pt states more outside itching then internal vaginal itching. Pt aware if test comes back negative and diflucan does not help make an appointment with [facility] to follow up

## 2022-01-14 LAB — CERVICOVAGINAL ANCILLARY ONLY
Bacterial Vaginitis (gardnerella): NEGATIVE
Candida Glabrata: NEGATIVE
Candida Vaginitis: NEGATIVE
Chlamydia: NEGATIVE
Comment: NEGATIVE
Comment: NEGATIVE
Comment: NEGATIVE
Comment: NEGATIVE
Comment: NEGATIVE
Comment: NORMAL
Neisseria Gonorrhea: NEGATIVE
Trichomonas: NEGATIVE

## 2022-01-18 ENCOUNTER — Ambulatory Visit
Admission: RE | Admit: 2022-01-18 | Discharge: 2022-01-18 | Disposition: A | Discharge status: Home / Self Care | Payer: 59 | Source: Ambulatory Visit | Attending: Obstetrics and Gynecology | Admitting: Obstetrics and Gynecology

## 2022-01-18 DIAGNOSIS — N6489 Other specified disorders of breast: Secondary | ICD-10-CM | POA: Insufficient documentation

## 2022-01-18 DIAGNOSIS — Z1231 Encounter for screening mammogram for malignant neoplasm of breast: Secondary | ICD-10-CM | POA: Insufficient documentation

## 2022-01-31 ENCOUNTER — Encounter (INDEPENDENT_AMBULATORY_CARE_PROVIDER_SITE_OTHER): Payer: Self-pay

## 2022-02-01 ENCOUNTER — Encounter: Payer: Self-pay | Admitting: Family Medicine

## 2022-02-01 ENCOUNTER — Ambulatory Visit: Payer: 59 | Admitting: Family Medicine

## 2022-02-01 VITALS — BP 122/62 | HR 105 | Temp 98.9°F | Ht 66.0 in | Wt 254.0 lb

## 2022-02-01 DIAGNOSIS — J069 Acute upper respiratory infection, unspecified: Secondary | ICD-10-CM | POA: Diagnosis not present

## 2022-02-01 DIAGNOSIS — R059 Cough, unspecified: Secondary | ICD-10-CM

## 2022-02-01 DIAGNOSIS — R091 Pleurisy: Secondary | ICD-10-CM | POA: Diagnosis not present

## 2022-02-01 LAB — INFLUENZA A AND B AG, IMMUNOASSAY
INFLUENZA A ANTIGEN: NOT DETECTED
INFLUENZA B ANTIGEN: NOT DETECTED

## 2022-02-01 MED ORDER — HYDROCODONE BIT-HOMATROP MBR 5-1.5 MG/5ML PO SOLN: 5.0000 mL | Freq: Three times a day (TID) | ORAL | 0 refills | Status: DC | PRN

## 2022-02-01 MED ORDER — NIRMATRELVIR/RITONAVIR (PAXLOVID)TABLET: 3.0000 | ORAL_TABLET | Freq: Two times a day (BID) | ORAL | 0 refills | Status: AC

## 2022-02-01 NOTE — Progress Notes (Signed)
Subjective:    Patient ID: Belinda Martin, female    DOB: [birth year], 46 y.o.   MRN: [id number] Pt c/o cough, runny nose, headache. Pt c/o sx since Sunday. Pt states she started coughing until she vomited yesterday. Pt c/o SOB and sharp pain in back and under R breast with coughing.   Symptoms have been present now for 2 days.  She reports coughing.  She reports pleurisy specifically in her right posterior lung which she attributes to the coughing.  She denies any hemoptysis however she feels like she cannot take a deep breath.  She also complains of pain in her left axilla.  There is no palpable lymphadenopathy.  There is no abscess.  She reports runny nose and congestion.  Flu test is negative. 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 8 g 0   Azelaic Acid 15 % gel After skin is thoroughly washed and patted dry, gently but thoroughly massage a thin film of azelaic acid cream into the affected area twice daily, in the morning  and evening. 50 g 2   Biotin 10 MG TABS Take by mouth.     Drospirenone (SLYND) 4 MG TABS Take 1 tablet by mouth daily. 84 tablet 3   fexofenadine (ALLEGRA) 180 MG tablet Take 180 mg by mouth daily.     fluconazole (DIFLUCAN) 150 MG tablet Take 150 mg by mouth once.     folic acid (FOLVITE) 1 MG tablet Take 3 mg by mouth daily.     Glucosamine 500 MG CAPS Take 1,000 mg by mouth daily.     HYDROcodone bit-homatropine (HYCODAN) 5-1.5 MG/5ML syrup Take 5 mLs by mouth every 8 (eight) hours as needed for cough. 120 mL 0   hydrocortisone 2.5 % cream Apply topically 2 (two) times daily as needed (Rash). To area at abdomen for up to 2 weeks. Avoid applying to face, groin, and axilla. Use as directed. Risk of skin atrophy with long-term use reviewed. 30 g 1   Ibuprofen-Famotidine (DUEXIS) 800-26.6 MG TABS Take 1 tablet by mouth as needed.     Multiple Vitamins-Calcium (ONE-A-DAY WOMENS FORMULA PO) Take 1 tablet by mouth daily.     Omega-3 Fatty Acids (FISH OIL) 1000 MG CAPS Take 2 capsules by mouth daily.     tiZANidine (ZANAFLEX) 4 MG tablet Take 1 tablet (  4 mg total) by mouth every 6 (six) hours as needed for muscle spasms. 30 tablet 1   TRIAMCINOLONE ACETONIDE, TOP, (TRIANEX) 0.05 % OINT Apply twice daily to hands for up to 2 weeks then weekends only. Avoid applying to face, groin, and axilla. Use as directed. Risk of skin atrophy with long-term use reviewed. 110 g 1   vitamin B-12 (CYANOCOBALAMIN) 500 MCG tablet Take 1,000 mcg by mouth daily.     No current facility-administered medications on file prior to visit.   Allergies  Allergen Reactions   Amoxicillin     REACTION: hives   Social History   Socioeconomic History   Marital status: Married    Spouse name: Not on file   Number of children: 0   Years of education: Not on file   Highest education level: Not on file  Occupational History   Occupation: Mental health assessments    Comment: [employer]  Tobacco Use   Smoking  status: Never   Smokeless tobacco: Never  Vaping Use   Vaping Use: Never used  Substance and Sexual Activity   Alcohol use: Yes    Comment: rare   Drug use: No   Sexual activity: Yes    Partners: Male    Birth control/protection: Pill  Other Topics Concern   Not on file  Social History Narrative   Remarried; lives in [place]   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       She has lost about 90 pounds in the last year-dietary modification and try to increase exercise level.   Her new husband has encouraged her to increase her exercise => she walks most days.  They go hiking on the weekends.     Social Determinants of Health   Financial Resource Strain: Not on file  Food Insecurity: Not on file  Transportation Needs: Not on file  Physical Activity: Inactive (05/29/2017)   Exercise Vital Sign    Days of Exercise per Week: 0 days    Minutes of Exercise per Session: 0 min  Stress: Stress Concern Present (05/29/2017)   [place]    Feeling of Stress : To some extent  Social Connections: Not on file  Intimate Partner Violence: Not on file      Review of Systems  Cardiovascular:  Positive for palpitations.  All other systems reviewed and are negative.      Objective:   Physical Exam Vitals reviewed.  Constitutional:      Appearance: She is well-developed.  HENT:     Right Ear: Tympanic membrane and ear canal normal.     Left Ear: Tympanic membrane and ear canal normal.     Nose: Mucosal edema and rhinorrhea present.     Right Sinus: Maxillary sinus tenderness and frontal sinus tenderness present.  Cardiovascular:     Rate and Rhythm: Normal rate and regular rhythm.     Heart sounds: Normal heart sounds.  Pulmonary:     Effort: Pulmonary effort is normal.     Breath sounds: Normal breath sounds.           Assessment & Plan:   Viral upper respiratory tract infection -  Plan: Influenza A and B Ag, Immunoassay  Cough in adult - Plan: Influenza A and B Ag, Immunoassay  Pleurisy - Plan: D-dimer, quantitative Patient has a family history of blood clots.  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  I believe she likely has COVID.  For these reasons I am concerned about a pulmonary embolism due to her shortness of breath and her pleurisy so I will check a D-dimer and if positive I will get a CT scan of her chest.  I recommended the patient take a home test for COVID as we do not have any rapid COVID test in the office.  Her flu test today was negative.  If her COVID test is positive, I want her to start Paxlovid.  She will take 3 tablets twice daily for 5 days and she can use Hycodan for cough.

## 2022-02-02 ENCOUNTER — Telehealth: Payer: Self-pay

## 2022-02-02 ENCOUNTER — Other Ambulatory Visit: Payer: Self-pay | Admitting: Family Medicine

## 2022-02-02 DIAGNOSIS — R071 Chest pain on breathing: Secondary | ICD-10-CM

## 2022-02-02 DIAGNOSIS — R7989 Other specified abnormal findings of blood chemistry: Secondary | ICD-10-CM

## 2022-02-02 LAB — D-DIMER, QUANTITATIVE: D-Dimer, Quant: 2.08 mcg/mL FEU — ABNORMAL HIGH (ref <0.50)

## 2022-02-02 NOTE — Telephone Encounter (Signed)
COVID POSITIVE TEST: Pt had + test at home after visit in office yesterday. Thank you.

## 2022-02-03 ENCOUNTER — Ambulatory Visit
Admission: RE | Admit: 2022-02-03 | Discharge: 2022-02-03 | Disposition: A | Discharge status: Home / Self Care | Payer: 59 | Source: Ambulatory Visit | Attending: Family Medicine | Admitting: Family Medicine

## 2022-02-03 DIAGNOSIS — R7989 Other specified abnormal findings of blood chemistry: Secondary | ICD-10-CM

## 2022-02-03 DIAGNOSIS — R071 Chest pain on breathing: Secondary | ICD-10-CM

## 2022-02-03 MED ORDER — IOPAMIDOL (ISOVUE-370) INJECTION 76%
75.0000 mL | Freq: Once | INTRAVENOUS | Status: AC | PRN
  Administered 2022-02-03: 75 mL via INTRAVENOUS

## 2022-02-06 NOTE — Progress Notes (Unsigned)
Belinda Frizzle, MD   No chief complaint on file.   HPI:      Belinda Martin is a 46 y.o. G1P0010 whose LMP was No LMP recorded. (Menstrual status: Oral contraceptives)., presents today for *** Had ext vag ithcing 10/23, neg yeast on culture, treated with diflucan  or her annual examination.  Her menses are absent with Slynd except for occas spotting for 1 day, no dysmen. Was having heavier and longer bleeding with camila in past.    Sex activity: single partner, contraception - slynd.  Hx of visual changes with headaches in past. Did depo in HS with wt gain and worsening migraines.  Patient Active Problem List   Diagnosis Date Noted   Abnormal mammogram of left breast 06/23/2020   Rapid palpitations 05/11/2020   MTHFR gene mutation    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   Pseudotumor cerebri    Sleep disorder    Obesity    Irritable bowel syndrome    DDD (degenerative disc disease), lumbar    Environmental and seasonal allergies 07/21/2013   Asthma 01/15/2009   History of abnormal cervical Pap smear 04/05/2007    Past Surgical History:  Procedure Laterality Date   Electrocautery of condyloma accuminata  09/2007   Dr Rayford Halsted   NASAL SINUS SURGERY     TONSILLECTOMY     TRANSTHORACIC ECHOCARDIOGRAM  10/10   EF 65-70%, normal valves    Family History  Problem Relation Age of Onset   Arrhythmia Mother    Heart murmur Mother    Endometriosis Mother    Fibromyalgia Mother    Irritable bowel syndrome Mother    Deep vein thrombosis Father        MTHFR homozygous   Atrial fibrillation Father    Hypertension Father    Heart failure Paternal Grandfather        CHF;   Stomach cancer Paternal Grandfather    Crohn's disease Maternal Grandmother    Diabetes Maternal Grandmother    Heart murmur Paternal Uncle    Coronary artery disease Neg Hx        premature CAD   Breast cancer Neg Hx     Social History   Socioeconomic History   Marital status: Married     Spouse name: Not on file   Number of children: 0   Years of education: Not on file   Highest education level: Not on file  Occupational History   Occupation: Mental health assessments    Comment: [employer]  Tobacco Use   Smoking status: Never   Smokeless tobacco: Never  Vaping Use   Vaping Use: Never used  Substance and Sexual Activity   Alcohol use: Yes    Comment: rare   Drug use: No   Sexual activity: Yes    Partners: Male    Birth control/protection: Pill  Other Topics Concern   Not on file  Social History Narrative   Remarried; lives in [place]   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       She has lost about 90 pounds in the last year-dietary modification and try to increase exercise level.   Her new husband has encouraged her to increase her exercise => she walks most days.  They go hiking on the weekends.     Social Determinants of Health   Financial Resource Strain: Not on file  Food Insecurity: Not on file  Transportation Needs: Not on file  Physical Activity: Inactive (05/29/2017)   Exercise Vital Sign    Days of Exercise per Week: 0 days    Minutes of Exercise per Session: 0 min  Stress: Stress Concern Present (05/29/2017)   [place]    Feeling of Stress : To some extent  Social Connections: Not on file  Intimate Partner Violence: Not on file    Outpatient Medications Prior to Visit  Medication Sig Dispense Refill   albuterol (VENTOLIN HFA) 108 (90 Base) MCG/ACT inhaler Inhale 2 puffs into the lungs every 6 (six) hours as needed for wheezing or shortness of breath. 8 g 0   Azelaic Acid 15 % gel After skin is thoroughly washed and patted dry, gently but thoroughly massage a thin film of azelaic acid cream into the affected area twice daily, in the morning and evening. 50 g 2   Biotin 10 MG TABS Take by mouth.     Drospirenone (SLYND) 4 MG TABS Take 1  tablet by mouth daily. 84 tablet 3   fexofenadine (ALLEGRA) 180 MG tablet Take 180 mg by mouth daily.     fluconazole (DIFLUCAN) 150 MG tablet Take 150 mg by mouth once.     folic acid (FOLVITE) 1 MG tablet Take 3 mg by mouth daily.     Glucosamine 500 MG CAPS Take 1,000 mg by mouth daily.     HYDROcodone bit-homatropine (HYCODAN) 5-1.5 MG/5ML syrup Take 5 mLs by mouth every 8 (eight) hours as needed for cough. 120 mL 0   hydrocortisone 2.5 % cream Apply topically 2 (two) times daily as needed (Rash). To area at abdomen for up to 2 weeks. Avoid applying to face, groin, and axilla. Use as directed. Risk of skin atrophy with long-term use reviewed. 30 g 1   Ibuprofen-Famotidine (DUEXIS) 800-26.6 MG TABS Take 1 tablet by mouth as needed.     Multiple Vitamins-Calcium (ONE-A-DAY WOMENS FORMULA PO) Take 1 tablet by mouth daily.     nirmatrelvir/ritonavir EUA (PAXLOVID) 20 x 150 MG & 10 x '100MG'$  TABS Take 3 tablets by mouth 2 (two) times daily for 5 days. (Take nirmatrelvir 150 mg two tablets twice daily for 5 days and ritonavir 100 mg one tablet twice daily for 5 days) 30 tablet 0   Omega-3 Fatty Acids (FISH OIL) 1000 MG CAPS Take 2 capsules by mouth daily.     tiZANidine (ZANAFLEX) 4 MG tablet Take 1 tablet (4 mg total) by mouth every 6 (six) hours as needed for muscle spasms. 30 tablet 1   TRIAMCINOLONE ACETONIDE, TOP, (TRIANEX) 0.05 % OINT Apply twice daily to hands for up to 2 weeks then weekends only. Avoid applying to face, groin, and axilla. Use as directed. Risk of skin atrophy with long-term use reviewed. 110 g 1   vitamin B-12 (CYANOCOBALAMIN) 500 MCG tablet Take 1,000 mcg by mouth daily.     No facility-administered medications prior to visit.      ROS:  Review of Systems BREAST: No symptoms   OBJECTIVE:   Vitals:  There were no vitals taken for this visit.  Physical Exam  Results: No results found for this or any previous visit (from the past 24  hour(s)).   Assessment/Plan: No diagnosis found.    No orders of the defined types were placed in this encounter.     No follow-ups on file.  Mileah Hemmer B. Kateline Kinkade, PA-C 02/06/2022 8:14 PM

## 2022-02-07 ENCOUNTER — Encounter: Payer: Self-pay | Admitting: Obstetrics and Gynecology

## 2022-02-07 ENCOUNTER — Ambulatory Visit (INDEPENDENT_AMBULATORY_CARE_PROVIDER_SITE_OTHER): Payer: 59 | Admitting: Obstetrics and Gynecology

## 2022-02-07 VITALS — BP 110/80 | Ht 66.0 in | Wt 256.0 lb

## 2022-02-07 DIAGNOSIS — Z1329 Encounter for screening for other suspected endocrine disorder: Secondary | ICD-10-CM

## 2022-02-07 DIAGNOSIS — R351 Nocturia: Secondary | ICD-10-CM

## 2022-02-07 DIAGNOSIS — R61 Generalized hyperhidrosis: Secondary | ICD-10-CM | POA: Diagnosis not present

## 2022-02-07 DIAGNOSIS — N898 Other specified noninflammatory disorders of vagina: Secondary | ICD-10-CM

## 2022-02-07 DIAGNOSIS — Z0000 Encounter for general adult medical examination without abnormal findings: Secondary | ICD-10-CM

## 2022-02-07 DIAGNOSIS — Z131 Encounter for screening for diabetes mellitus: Secondary | ICD-10-CM

## 2022-02-07 MED ORDER — CLOTRIMAZOLE-BETAMETHASONE 1-0.05 % EX CREA: TOPICAL_CREAM | CUTANEOUS | 0 refills | Status: DC

## 2022-02-07 NOTE — Patient Instructions (Signed)
I value your feedback and you entrusting us with your care. If you get a [facility] patient survey, I would appreciate you taking the time to let us know about your experience today. Thank you! ? ? ?

## 2022-02-08 LAB — CBC WITH DIFFERENTIAL/PLATELET
Basophils Absolute: 0 10*3/uL (ref 0.0–0.2)
Basos: 1 %
EOS (ABSOLUTE): 0.1 10*3/uL (ref 0.0–0.4)
Eos: 2 %
Hematocrit: 42.6 % (ref 34.0–46.6)
Hemoglobin: 14.2 g/dL (ref 11.1–15.9)
Immature Grans (Abs): 0 10*3/uL (ref 0.0–0.1)
Immature Granulocytes: 0 %
Lymphocytes Absolute: 3.4 10*3/uL — ABNORMAL HIGH (ref 0.7–3.1)
Lymphs: 41 %
MCH: 30 pg (ref 26.6–33.0)
MCHC: 33.3 g/dL (ref 31.5–35.7)
MCV: 90 fL (ref 79–97)
Monocytes Absolute: 0.8 10*3/uL (ref 0.1–0.9)
Monocytes: 10 %
Neutrophils Absolute: 3.9 10*3/uL (ref 1.4–7.0)
Neutrophils: 46 %
Platelets: 295 10*3/uL (ref 150–450)
RBC: 4.73 x10E6/uL (ref 3.77–5.28)
RDW: 13.2 % (ref 11.7–15.4)
WBC: 8.3 10*3/uL (ref 3.4–10.8)

## 2022-02-08 LAB — COMPREHENSIVE METABOLIC PANEL
ALT: 15 IU/L (ref 0–32)
AST: 12 IU/L (ref 0–40)
Albumin/Globulin Ratio: 1.8 (ref 1.2–2.2)
Albumin: 4.2 g/dL (ref 3.9–4.9)
Alkaline Phosphatase: 112 IU/L (ref 44–121)
BUN/Creatinine Ratio: 14 (ref 9–23)
BUN: 13 mg/dL (ref 6–24)
Bilirubin Total: <0.2 mg/dL (ref 0.0–1.2)
CO2: 21 mmol/L (ref 20–29)
Calcium: 9.1 mg/dL (ref 8.7–10.2)
Chloride: 106 mmol/L (ref 96–106)
Creatinine, Ser: 0.91 mg/dL (ref 0.57–1.00)
Globulin, Total: 2.4 g/dL (ref 1.5–4.5)
Glucose: 102 mg/dL — ABNORMAL HIGH (ref 70–99)
Potassium: 4.1 mmol/L (ref 3.5–5.2)
Sodium: 142 mmol/L (ref 134–144)
Total Protein: 6.6 g/dL (ref 6.0–8.5)
eGFR: 80 mL/min/{1.73_m2} (ref >59)

## 2022-02-08 LAB — TSH+FREE T4
Free T4: 0.96 ng/dL (ref 0.82–1.77)
TSH: 3.59 u[IU]/mL (ref 0.450–4.500)

## 2022-02-08 LAB — HEMOGLOBIN A1C
Est. average glucose Bld gHb Est-mCnc: 117 mg/dL
Hgb A1c MFr Bld: 5.7 % — ABNORMAL HIGH (ref 4.8–5.6)

## 2022-03-03 ENCOUNTER — Encounter: Payer: Self-pay | Admitting: Dermatology

## 2022-03-03 ENCOUNTER — Ambulatory Visit: Payer: 59 | Admitting: Dermatology

## 2022-03-03 DIAGNOSIS — I8393 Asymptomatic varicose veins of bilateral lower extremities: Secondary | ICD-10-CM

## 2022-03-03 DIAGNOSIS — L811 Chloasma: Secondary | ICD-10-CM

## 2022-03-03 DIAGNOSIS — K649 Unspecified hemorrhoids: Secondary | ICD-10-CM

## 2022-03-03 DIAGNOSIS — D2371 Other benign neoplasm of skin of right lower limb, including hip: Secondary | ICD-10-CM

## 2022-03-03 DIAGNOSIS — L821 Other seborrheic keratosis: Secondary | ICD-10-CM | POA: Diagnosis not present

## 2022-03-03 MED ORDER — CLINDAMYCIN PHOSPHATE 1 % EX GEL: CUTANEOUS | 1 refills | Status: DC

## 2022-03-03 MED ORDER — HYDROCORTISONE 2.5 % EX CREA: TOPICAL_CREAM | CUTANEOUS | 1 refills | Status: DC

## 2022-03-03 MED ORDER — AZELAIC ACID 15 % EX GEL: CUTANEOUS | 3 refills | Status: DC

## 2022-03-03 NOTE — Patient Instructions (Addendum)
Continue Azelaic acid twice daily.  Restart  [facility] Hydroquinone 8%, Tretinoin 0.1%, Kojic acid 1%, Niacinamide 4%, Fluocinolone 0.025% cream, a pea sized amount nightly to dark spots on face for up to 2 months. This cannot be used more than 3 months due to risk of skin atrophy (thinning) and exogenous ochronosis (permanent dark spots). The patient was advised this is not covered by insurance. They will receive an email to check out and the medication will be mailed to their home.     Instructions for [facility] Medications  One or more of your medications was sent to the [facility] mail order compounding pharmacy. You will receive an email from them and can purchase the medicine through that link. It will then be mailed to your home at the address you confirmed. If for any reason you do not receive an email from them, please check your spam folder. If you still do not find the email, please let us know. [facility] phone number is [phone].    Recommend taking Heliocare sun protection supplement daily in sunny weather for additional sun protection. For maximum protection on the sunniest days, you can take up to 2 capsules of regular Heliocare OR take 1 capsule of Heliocare Ultra. For prolonged exposure (such as a full day in the sun), you can repeat your dose of the supplement 4 hours after your first dose. Heliocare can be purchased at [employer], at some [facility] or at [email or web address].      Start Hydrocortisone 2.5% cream twice daily. Apply 1st  Start Clindamycin gel twice daily. Apply 2nd  Recommend Aquaphor twice daily. Apply last   Due to recent changes in healthcare laws, you may see results of your pathology and/or laboratory studies on MyChart before the doctors have had a chance to review them. We understand that in some cases there may be results that are confusing or concerning to you. Please understand that not all results are received at the  same time and often the doctors may need to interpret multiple results in order to provide you with the best plan of care or course of treatment. Therefore, we ask that you please give [place] 2 business days to thoroughly review all your results before contacting the office for clarification. Should we see a critical lab result, you will be contacted sooner.   If You Need Anything After Your Visit  If you have any questions or concerns for your doctor, please call our main line at [phone] and press option 4 to reach your doctor's medical assistant. If no one answers, please leave a voicemail as directed and we will return your call as soon as possible. Messages left after 4 pm will be answered the following business day.   You may also send [place] a message via [place]. We typically respond to MyChart messages within 1-2 business days.  For prescription refills, please ask your pharmacy to contact our office. Our fax number is [phone].  If you have an urgent issue when the clinic is closed that cannot wait until the next business day, you can page your doctor at the number below.    Please note that while we do our best to be available for urgent issues outside of office hours, we are not available 24/7.   If you have an urgent issue and are unable to reach [place], you may choose to seek medical care at your doctor's office, retail clinic, urgent care center, or emergency room.  If  you have a medical emergency, please immediately call 911 or go to the emergency department.  Pager Numbers  - Dr. Nehemiah Massed: [phone]  - Dr. Laurence Ferrari: [phone]  - Dr. Nicole Kindred: [phone]  In the event of inclement weather, please call our main line at [phone] for an update on the status of any delays or closures.  Dermatology Medication Tips: Please keep the boxes that topical medications come in in order to help keep track of the instructions about where and how to use these. Pharmacies typically  print the medication instructions only on the boxes and not directly on the medication tubes.   If your medication is too expensive, please contact our office at [phone] option 4 or send [place] a message through [place].   We are unable to tell what your co-pay for medications will be in advance as this is different depending on your insurance coverage. However, we may be able to find a substitute medication at lower cost or fill out paperwork to get insurance to cover a needed medication.   If a prior authorization is required to get your medication covered by your insurance company, please allow [place] 1-2 business days to complete this process.  Drug prices often vary depending on where the prescription is filled and some pharmacies may offer cheaper prices.  The website www.goodrx.com contains coupons for medications through different pharmacies. The prices here do not account for what the cost may be with help from insurance (it may be cheaper with your insurance), but the website can give you the price if you did not use any insurance.  - You can print the associated coupon and take it with your prescription to the pharmacy.  - You may also stop by our office during regular business hours and pick up a GoodRx coupon card.  - If you need your prescription sent electronically to a different pharmacy, notify our office through [facility] or by phone at [phone] option 4.     Si Usted Necesita Algo Despus de Su Visita  Tambin puede enviarnos un mensaje a travs de Pharmacist, community. Por lo general respondemos a los mensajes de MyChart en el transcurso de 1 a 2 das hbiles.  Para renovar recetas, por favor pida a su farmacia que se ponga en contacto con nuestra oficina. Harland Dingwall de fax es [place] [phone].  Si tiene un asunto urgente cuando la clnica est cerrada y que no puede esperar hasta el siguiente da hbil, puede llamar/localizar a su doctor(a) al nmero que aparece a  continuacin.   Por favor, tenga en cuenta que aunque hacemos todo lo posible para estar disponibles para asuntos urgentes fuera del horario de [place], no estamos disponibles las 24 horas del da, los 7 das de la [place].   Si tiene un problema urgente y no puede comunicarse con nosotros, puede optar por buscar atencin mdica  en el consultorio de su doctor(a), en una clnica privada, en un centro de atencin urgente o en una sala de emergencias.  Si tiene Engineering geologist, por favor llame inmediatamente al 911 o vaya a la sala de emergencias.  Nmeros de bper  - Dr. Nehemiah Massed: [phone]  - Dra. Moye: [phone]  - Dra. Nicole Kindred: [phone]  En caso de inclemencias del [place], por favor llame a Johnsie Kindred principal al [phone] para una actualizacin sobre el [place] de cualquier retraso o cierre.  Consejos para la medicacin en dermatologa: Por favor, guarde las cajas en las que vienen los medicamentos de [place]  tpico para ayudarle a seguir las instrucciones sobre dnde y cmo usarlos. Las farmacias generalmente imprimen las instrucciones del medicamento slo en las cajas y no directamente en los tubos del [place].   Si su medicamento es muy caro, por favor, pngase en contacto con Zigmund Daniel llamando al [phone] y presione la opcin 4 o envenos un mensaje a travs de Pharmacist, community.   No podemos decirle cul ser su copago por los medicamentos por adelantado ya que esto es diferente dependiendo de la cobertura de su seguro. Sin embargo, es posible que podamos encontrar un medicamento sustituto a Electrical engineer un formulario para que el seguro cubra el medicamento que se considera necesario.   Si se requiere una autorizacin previa para que su compaa de seguros [place] su medicamento, por favor permtanos de 1 a 2 das hbiles para completar este proceso.  Los precios de los medicamentos varan con frecuencia dependiendo del Environmental consultant de dnde se surte la receta  y alguna farmacias pueden ofrecer precios ms baratos.  El sitio web www.goodrx.com tiene cupones para medicamentos de Airline pilot. Los precios aqu no tienen en cuenta lo que podra costar con la ayuda del seguro (puede ser ms barato con su seguro), pero el sitio web puede darle el precio si no utiliz Research scientist (physical sciences).  - Puede imprimir el cupn correspondiente y llevarlo con su receta a la farmacia.  - Tambin puede pasar por nuestra oficina durante el horario de atencin regular y Charity fundraiser una tarjeta de cupones de GoodRx.  - Si necesita que su receta se enve electrnicamente a una farmacia diferente, informe a nuestra oficina a travs de MyChart de [place] o por telfono llamando al [phone] y presione la opcin 4.

## 2022-03-03 NOTE — Progress Notes (Signed)
Follow-Up Visit   Subjective  Belinda Martin is a 46 y.o. female who presents for the following: melasma (Using Azelaic acid and The Perfect A. Has used [facility] in the past) and lesion (Few days. Buttocks near anus. Area is painful. Thinks could be a boil).  The patient has spots, moles and lesions to be evaluated, some may be new or changing and the patient has concerns that these could be cancer.  The following portions of the chart were reviewed this encounter and updated as appropriate:  Tobacco  Allergies  Meds  Problems  Med Hx  Surg Hx  Fam Hx      Review of Systems: No other skin or systemic complaints except as noted in HPI or Assessment and Plan.   Objective  Well appearing patient in no apparent distress; mood and affect are within normal limits.  A focused examination was performed including face, buttocks. Relevant physical exam findings are noted in the Assessment and Plan.  face Reticulated hyperpigmented patches.    Assessment & Plan  Melasma face  Chronic and persistent condition with duration or expected duration over one year. Condition is symptomatic/ bothersome to patient. Not currently at goal.  Melasma is a chronic; persistent condition of hyperpigmented patches generally on the face, worse in summer due to higher UV exposure.    Heredity; thyroid disease; sun exposure; pregnancy; birth control pills; epilepsy medication and darker skin may predispose to Melasma.   Recommendations include: - Sun avoidance and daily broad spectrum (UVA/UVB) sunscreen SPF 30+, preferably with Zinc or Titanium Dioxide. - Rx topical bleaching creams (i.e. hydroquinone) is a common treatment but should not be used long term.  Hydroquinones may be mixed with retinoids; steroids; Kojic Acid. - Rx Azelaic Acid is also a treatment option that is safe for pregnancy (Category B). - OTC Heliocare can be helpful in control and prevention. - Oral Rx with Tranexamic Acid  250 mg - 650 mg po daily can be used for moderate to severe cases especially during summer (contraindications include pregnancy; lactation; hx of PE; DVT; clotting disorder; heart disease; anticoagulant use and upcoming long trips)   - Chemical peels (would need multiple for best result).  - Lasers and  Microdermabrasion may also be helpful adjunct treatments.  Continue Azelaic acid twice daily.  Restart  [facility] Hydroquinone 8%, Tretinoin 0.1%, Kojic acid 1%, Niacinamide 4%, Fluocinolone 0.025% cream, a pea sized amount nightly to dark spots on face for up to 2 months. This cannot be used more than 3 months due to risk of skin atrophy (thinning) and exogenous ochronosis (permanent dark spots). The patient was advised this is not covered by insurance. They will receive an email to check out and the medication will be mailed to their home.   Continue the Perfect A (tretinoin 0.1% with vitamin C)  Could consider adding Alastin A-luminate cream   Related Medications Azelaic Acid 15 % gel After skin is thoroughly washed and patted dry, gently but thoroughly massage a thin film of azelaic acid cream into the affected area twice daily, in the morning and evening.  Hemorrhoids, unspecified hemorrhoid type Perianal Skin  Start Hydrocortisone 2.5% cream twice daily up to 2 weeks  Start Clindamycin gel twice daily  Recommend Aquaphor twice daily  clindamycin (CLINDAGEL) 1 % gel - Perianal Skin Apply twice daily to affected area as needed  hydrocortisone 2.5 % cream - Perianal Skin Apply twice daily to affected area as needed for irritation.  Seborrheic Keratoses. Left thigh. - Stuck-on, waxy, tan-brown papules and/or plaques  - Benign-appearing - Discussed benign etiology and prognosis. - Observe - Call for any changes  Dermatofibroma. Right lower leg - Firm pink/brown papulenodule with dimple sign - Benign appearing - Call for any changes  Varicose Veins/Spider Veins -  Dilated blue, purple or red veins at the lower extremities - Reassured - Smaller vessels can be treated by sclerotherapy (a procedure to inject a medicine into the veins to make them disappear) if desired, but the treatment is not covered by insurance. Larger vessels may be covered if symptomatic and we would refer to vascular surgeon if treatment desired.  Return in about 6 months (around 09/01/2022) for Melasma Follow Up, sclerotherapy next available .  I, Emelia Salisbury, CMA, am acting as scribe for Forest Gleason, MD.  Documentation: I have reviewed the above documentation for accuracy and completeness, and I agree with the above.  Forest Gleason, MD

## 2022-03-15 ENCOUNTER — Encounter: Payer: Self-pay | Admitting: Family Medicine

## 2022-03-15 ENCOUNTER — Ambulatory Visit: Payer: 59 | Admitting: Family Medicine

## 2022-03-15 VITALS — BP 132/78 | HR 82 | Temp 98.5°F | Ht 66.0 in | Wt 255.0 lb

## 2022-03-15 DIAGNOSIS — J4521 Mild intermittent asthma with (acute) exacerbation: Secondary | ICD-10-CM | POA: Diagnosis not present

## 2022-03-15 MED ORDER — AZITHROMYCIN 250 MG PO TABS: ORAL_TABLET | ORAL | 0 refills | Status: DC

## 2022-03-15 MED ORDER — ALBUTEROL SULFATE HFA 108 (90 BASE) MCG/ACT IN AERS: 2.0000 | INHALATION_SPRAY | Freq: Four times a day (QID) | RESPIRATORY_TRACT | 1 refills | Status: DC | PRN

## 2022-03-15 MED ORDER — PREDNISONE 20 MG PO TABS: ORAL_TABLET | ORAL | 0 refills | Status: DC

## 2022-03-15 NOTE — Progress Notes (Signed)
Subjective:    Patient ID: Belinda Martin, female    DOB: 1975-08-24, 46 y.o.   MRN: [id number]   Patient developed cough and wheezing on Friday.  Using albuterol more frequently every 6 or so hours.  Cough is dry and non productive.  Does have rhinorhea, and sinus pressure. No sore throat.  No fever.   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 50 g 3   Biotin 10 MG TABS Take by mouth.     clindamycin (CLINDAGEL) 1 % gel Apply twice daily to affected area as needed 30 g 1   clotrimazole-betamethasone (LOTRISONE) cream Apply externally BID prn sx up to 2 wks 15 g 0   Drospirenone (SLYND) 4 MG TABS Take 1 tablet by mouth daily. 84 tablet 3   fexofenadine (ALLEGRA) 180 MG tablet Take 180 mg by mouth daily.     folic acid (FOLVITE) 1 MG tablet Take 3 mg by mouth daily.     Glucosamine 500 MG CAPS Take 1,000 mg by mouth daily.     HYDROcodone bit-homatropine (HYCODAN) 5-1.5 MG/5ML  syrup Take 5 mLs by mouth every 8 (eight) hours as needed for cough. (Patient not taking: Reported on 02/07/2022) 120 mL 0   hydrocortisone 2.5 % cream Apply topically 2 (two) times daily as needed (Rash). To area at abdomen for up to 2 weeks. Avoid applying to face, groin, and axilla. Use as directed. Risk of skin atrophy with long-term use reviewed. 30 g 1   hydrocortisone 2.5 % cream Apply twice daily to affected area as needed for irritation. 30 g 1   Ibuprofen-Famotidine (DUEXIS) 800-26.6 MG TABS Take 1 tablet by mouth as needed.     Multiple Vitamins-Calcium (ONE-A-DAY WOMENS FORMULA PO) Take 1 tablet by mouth daily.     Omega-3 Fatty Acids (FISH OIL) 1000 MG CAPS Take 2 capsules by mouth daily.     tiZANidine (ZANAFLEX) 4 MG tablet Take 1 tablet (4 mg total) by mouth every 6 (six) hours as needed for muscle spasms. 30 tablet 1   TRIAMCINOLONE ACETONIDE, TOP, (TRIANEX) 0.05 % OINT Apply twice daily to hands for up to 2 weeks then weekends only. Avoid applying to face, groin, and axilla. Use as directed. Risk of skin atrophy with long-term use reviewed.  110 g 1   vitamin B-12 (CYANOCOBALAMIN) 500 MCG tablet Take 1,000 mcg by mouth daily.     No current facility-administered medications on file prior to visit.   Allergies  Allergen Reactions   Amoxicillin     REACTION: hives   Social History   Socioeconomic History   Marital status: Married    Spouse name: Not on file   Number of children: 0   Years of education: Not on file   Highest education level: Not on file  Occupational History   Occupation: Mental health assessments    Comment: [employer]  Tobacco Use   Smoking status: Never   Smokeless tobacco: Never  Vaping Use   Vaping Use: Never used  Substance and Sexual Activity   Alcohol use: Yes    Comment: rare   Drug use: No   Sexual activity: Yes    Partners: Male    Birth control/protection: Pill  Other Topics Concern   Not on file  Social History Narrative    Remarried; lives in [place]   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       She has lost about 90 pounds in the last year-dietary modification and try to increase exercise level.   Her new husband has encouraged her to increase her exercise => she walks most days.  They go hiking on the weekends.     Social Determinants of Health   Financial Resource Strain: Not on file  Food Insecurity: Not on file  Transportation Needs: Not on file  Physical Activity: Inactive (05/29/2017)   Exercise Vital Sign    Days of Exercise per Week: 0 days    Minutes of Exercise per Session: 0 min  Stress: Stress Concern Present (05/29/2017)   [place]    Feeling of Stress : To some extent  Social Connections: Not on file  Intimate Partner Violence: Not on file      Review of Systems  Cardiovascular:  Positive for palpitations.  All other systems reviewed and are negative.      Objective:   Physical Exam Vitals reviewed.  Constitutional:      Appearance: She is well-developed.  HENT:     Right Ear: Tympanic membrane and ear canal normal.     Left Ear: Tympanic membrane and ear canal normal.     Nose: Mucosal edema and rhinorrhea present.     Right Sinus: Maxillary sinus tenderness and frontal sinus tenderness present.  Cardiovascular:     Rate and Rhythm: Normal rate and regular rhythm.     Heart sounds: Normal heart sounds.  Pulmonary:     Effort: Pulmonary effort is normal.     Breath sounds: Wheezing present. No rhonchi or rales.           Assessment & Plan:   Mild intermittent asthma with exacerbation Begin prednisone taper pack and use albuterol prn for wheezing.  Patient requests abx for sinus infection.  Recommended against abx unless sinus pain has lasted > 10 days.  She agrees to hold zpack unless symptoms persist into Christmas.

## 2022-04-21 ENCOUNTER — Encounter: Payer: Self-pay | Admitting: Cardiology

## 2022-04-21 ENCOUNTER — Ambulatory Visit: Payer: Commercial Managed Care - PPO | Attending: Cardiology | Admitting: Cardiology

## 2022-04-21 VITALS — BP 112/60 | HR 87 | Ht 67.0 in | Wt 260.8 lb

## 2022-04-21 DIAGNOSIS — R002 Palpitations: Secondary | ICD-10-CM

## 2022-04-21 DIAGNOSIS — R0609 Other forms of dyspnea: Secondary | ICD-10-CM | POA: Insufficient documentation

## 2022-04-21 DIAGNOSIS — G932 Benign intracranial hypertension: Secondary | ICD-10-CM

## 2022-04-21 DIAGNOSIS — G479 Sleep disorder, unspecified: Secondary | ICD-10-CM

## 2022-04-21 DIAGNOSIS — R5383 Other fatigue: Secondary | ICD-10-CM

## 2022-04-21 NOTE — Progress Notes (Signed)
Primary Care Provider: [name], MD [facility] Cardiologist: Glenetta Hew, MD Electrophysiologist: None  Clinic Note: Chief Complaint  Patient presents with   Follow-up    Yearly follow up visit. Patient states that she had shortness of breath since she had covid. Patient states that she had heart palpitations since she last seen the doctor. Meds reviewed with patient.    ===================================  ASSESSMENT/PLAN   Problem List Items Addressed This Visit     Rapid palpitations - Primary (Chronic)    Unfortunately, last time she wore monitor, we did not capture anything.  At this point I think it would be futile to place another monitor, I think it would be more effective if she were to purchase a [employer] home monitoring system.  This way which she can capture an episode when they occur and not take a chance that 1 will occur when she is wearing a monitor.  Will try to avoid treating any palpitations medication until we know or treating.  I did want her to avoid excess caffeine and try to get better sleep.  Will try to avoid AV nodal agents if possible.      Relevant Orders   EKG 12-Lead   Sleep disorder    It is possible that her excessive limb movement could be affecting her sleep.  Not sure if she had a sleep study recently, as sleep apnea could be affecting her level of fatigue as well.  She does not have daytime sleepiness symptoms however.      Fatigue    Exercise intolerance/fatigue.  His probably is related to deconditioning and recent illnesses as well as weight gain.  Will assess EF by echo, monitor for palpitations with [employer]. Try to stress importance of adequate sleep, and continued exercise.  Will defer further evaluation to PCP.  Hemoglobin levels are stable.  TSH is stable.  Could consider vitamin D evaluation.      DOE (dyspnea on exertion) (Chronic)    Progressively worsening exertional dyspnea which may very  well and simply be because of her recent illnesses and deconditioning, but since she also has palpitations we will assess EF with an echocardiogram.  Could consider CPX to further assess cardiac versus pulmonary issues depending on echo results.      Relevant Orders   ECHOCARDIOGRAM COMPLETE   EKG 12-Lead    ===================================  HPI:    Belinda Martin is an obese 47 y.o. female with a PMH notable for ADHD, fatigue and palpitations who presents today for annual follow-up at the request of Dr. Antony Odea was last seen on June 24, 2020 palpitations with a Zio patch monitor revealing rare PACs and PVCs as well as a short PAT runs.  Mostly benign.  She had lost about 90 pounds in the preceding year..  She has not been able to go out hiking mountains 6 miles a day.  She indicated not having had any symptoms during monitor.  Much less prominent palpitations.  Otherwise doing well.  Walks every day despite having some fatigue.  Still losing weight.  Plan was for her to start Provigil for her ADD.  Recent Hospitalizations: No hospitalizations.  Had COVID late October early November.  Positive D-dimer but negative PE protocol CT  Reviewed  CV studies:    The following studies were reviewed today: (if available, images/films reviewed: From Epic Chart or Care Everywhere) PE protocol CTA 02/03/2022: No evidence of PE.  Interval History:  Belinda Martin returns today overall doing okay.  She is having a hard time getting over [place].  Noticing that she is having more exertional dyspnea, and fatigue.  She also notes that she still having intermittent episodes of palpitations.  They seem to be less often but are still present maybe once or twice a week but last less than a minute.  Not associate with any dizziness or lightheadedness.  Unfortunately, last time she wore monitor, we did not capture anything so she is reluctant to try that again.  She says that usually wake her  up from sleep, therefore she is really not noticing any dizziness or lightheadedness associated with it. She does not notice any association with caffeine in fact she is reduced level of caffeine to just 1 cup of coffee in the morning and then anything after lunch is decaf.  Perhaps her most concerning symptom is the exertional dyspnea and fatigue.  She says that she really has been out of the habit of doing exercise because she started getting sick back in December.  She had COVID after having bronchitis and then end up with a sinus infection after that.  She was given to be started on some type of medication, but it interacted with her OCPs.  She said that she used to be on do like 8 mile hikes without too much of a problem but now is only doing about 3 miles.  She denies any chest pain or pressure with rest or exertion.  But has occasionally had some jaw and chest discomfort episodes randomly.  No syncope or near syncope.  No TIA or amaurosis fugax.  No claudication. She has gained weight, not being as judicious in her eating mostly because of increased stress.  Her father was recently diagnosed with A-fib, he had a DVT and subsequently ended up having carotid endarterectomy.  He is she has been under increased stress because of that.  REVIEWED OF SYSTEMS   Pertinent symptoms noted above.  Otherwise review symptoms are negative not noted above.  I have reviewed and (if needed) personally updated the patient's problem list, medications, allergies, past medical and surgical history, social and family history.   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    PAST SURGICAL HISTORY   Past Surgical History:  Procedure Laterality Date   Electrocautery of condyloma accuminata  09/2007   Dr Rayford Halsted   NASAL SINUS SURGERY     TONSILLECTOMY     TRANSTHORACIC ECHOCARDIOGRAM  10/10   EF 65-70%, normal valves    Immunization History  Administered Date(s) Administered   Influenza,inj,Quad PF,6+ Mos 12/29/2014, 12/21/2015, 05/26/2017, 01/29/2018, 02/08/2019   PFIZER(Purple Top)SARS-COV-2 Vaccination 10/01/2019, 10/22/2019   Tdap 07/05/2013, 07/23/2021    MEDICATIONS/ALLERGIES   Current Meds  Medication Sig   albuterol (VENTOLIN HFA) 108 (  90 Base) MCG/ACT inhaler Inhale 2 puffs into the lungs every 6 (six) hours as needed for wheezing or shortness of breath.   Azelaic Acid 15 % gel After skin is thoroughly washed and patted dry, gently but thoroughly massage a thin film of azelaic acid cream into the affected area twice daily, in the morning and evening.   Biotin 10 MG TABS Take by mouth daily.   Drospirenone (SLYND) 4 MG TABS Take 1 tablet by mouth daily.   fexofenadine (ALLEGRA) 180 MG tablet Take 180 mg by mouth daily.   folic acid (FOLVITE) 1 MG tablet Take 3 mg by mouth daily.   Glucosamine 500 MG CAPS Take 1,000 mg by mouth daily.   hydrocortisone 2.5 % cream Apply topically 2 (two) times daily as needed (Rash). To area at abdomen for up to 2 weeks. Avoid applying to face, groin, and axilla. Use as directed. Risk of skin atrophy with long-term use reviewed.   hydrocortisone 2.5 % cream Apply twice daily to affected area as needed for irritation.   Ibuprofen-Famotidine (DUEXIS) 800-26.6 MG TABS Take 1 tablet by mouth as needed.   Multiple Vitamins-Calcium (ONE-A-DAY WOMENS FORMULA PO) Take 1 tablet by mouth daily.   Omega-3 Fatty Acids (FISH OIL)  1000 MG CAPS Take 2 capsules by mouth daily.   tiZANidine (ZANAFLEX) 4 MG tablet Take 1 tablet (4 mg total) by mouth every 6 (six) hours as needed for muscle spasms.   TRIAMCINOLONE ACETONIDE, TOP, (TRIANEX) 0.05 % OINT Apply twice daily to hands for up to 2 weeks then weekends only. Avoid applying to face, groin, and axilla. Use as directed. Risk of skin atrophy with long-term use reviewed.   vitamin B-12 (CYANOCOBALAMIN) 500 MCG tablet Take 1,000 mcg by mouth daily.    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       She has lost about 90 pounds in the last year-dietary modification and try to increase exercise level.   Her new husband has encouraged her to increase her exercise => she walks most days.  They go hiking on the weekends.      OBJCTIVE -PE, EKG, labs   Wt Readings from Last 3 Encounters:  04/21/22 260 lb 12.8 oz (118.3 kg)  03/15/22 255 lb (115.7 kg)  02/07/22 256 lb (116.1 kg)    Physical Exam: Physical Exam Vitals reviewed.  Constitutional:      General: She is not in acute distress.    Appearance: Normal appearance. She is obese. She is not ill-appearing or toxic-appearing.  HENT:     Head: Normocephalic and atraumatic.  Neck:     Vascular: No carotid bruit.  Cardiovascular:     Rate and Rhythm: Normal rate and regular rhythm. No extrasystoles are present.    Chest Wall: PMI is not displaced.     Pulses: Normal pulses.     Heart sounds: S1 normal and S2 normal. Heart sounds are distant. No murmur heard.  No gallop.   Pulmonary:     Effort: Pulmonary effort is normal. No respiratory distress.     Breath sounds: Normal breath sounds. No wheezing, rhonchi or rales.  Chest:     Chest wall: No tenderness.  Musculoskeletal:        General: No swelling. Normal range of motion.     Cervical back: Normal range of motion and neck supple.  Skin:    General: Skin is warm and dry.  Neurological:     General: No focal deficit present.     Mental Status: She is alert and oriented to person, place, and time.  Psychiatric:        Mood and Affect: Mood normal.     Adult ECG Report  Rate: 87 ;  Rhythm: normal sinus rhythm and normal axis, intervals and durations. ;   Narrative Interpretation: Stable  Recent Labs: Reviewed. No results found for: "CHOL", "HDL", "LDLCALC", "LDLDIRECT", "TRIG", "CHOLHDL" Lab Results  Component Value Date   CREATININE 0.91 02/07/2022   BUN 13 02/07/2022   NA 142 02/07/2022   K 4.1 02/07/2022   CL 106 02/07/2022   CO2 21 02/07/2022      Latest Ref Rng & Units 02/07/2022    4:07 PM 05/07/2020    9:28 AM 02/08/2019   12:19 PM  CBC  WBC 3.4 - 10.8 x10E3/uL 8.3  6.7  8.9   Hemoglobin 11.1 - 15.9 g/dL 14.2  15.0  13.6   Hematocrit 34.0 - 46.6 % 42.6  44.5  41.2   Platelets 150 - 450 x10E3/uL 295  269  301     Lab Results  Component Value Date   HGBA1C 5.7 (H) 02/07/2022   Lab Results  Component Value Date   TSH 3.590 02/07/2022    ================================================== I spent a total of 21 minutes with the patient spent in direct patient consultation.  Additional time spent with chart review  / charting (studies, outside notes, etc): 20 min Total Time: 41 min  Current medicines are reviewed at length with the patient today.  (+/- concerns) none  Notice: This dictation was prepared with Dragon dictation along with smart phrase technology. Any transcriptional errors that result from this process are unintentional and may not be corrected upon  review.  Studies Ordered:  Orders Placed This Encounter  Procedures   EKG 12-Lead   ECHOCARDIOGRAM COMPLETE   No orders of the defined types were placed in this encounter.   Patient Instructions / Medication Changes & Studies & Tests Ordered   Patient Instructions  Medication Instructions:  No changes at this time.  *If you need a refill on your cardiac medications before your next appointment, please call your pharmacy*   Lab Work: None  If you have labs (blood work) drawn today and your tests are completely normal, you will receive your results only by: [place] (if you have MyChart) OR A paper copy in the mail If you have any lab test that is abnormal or we need to change your treatment, we will call you to review the results.   Testing/Procedures: Your physician has requested that you have an echocardiogram. Echocardiography is a painless test that uses sound waves to create images of your heart. It provides your doctor with information about the size and shape of your heart and how well your heart's chambers and valves are working. This procedure takes approximately one hour. There are no restrictions for this procedure. Please do NOT wear cologne, perfume, aftershave,  or lotions (deodorant is allowed). Please arrive 15 minutes prior to your appointment time.    Follow-Up: At [facility], you and your health needs are our priority.  As part of our continuing mission to provide you with exceptional heart care, we have created designated Provider Care Teams.  These Care Teams include your primary Cardiologist (physician) and Advanced Practice Providers (APPs -  Physician Assistants and Nurse Practitioners) who all work together to provide you with the care you need, when you need it.   Your next appointment:   3 month(s)  Provider:   You may see Glenetta Hew, MD or one of the following Advanced Practice Providers on your designated Care Team:    Murray Hodgkins, NP Christell Faith, PA-C Cadence Kathlen Mody, PA-C Gerrie Nordmann, NP        Leonie Man, MD, MS Glenetta Hew, M.D., M.S. Interventional Cardiologist  [facility]   [address] [place], [place]  [address] [phone]           Fax [phone]    Thank you for choosing [facility] in [place]!!

## 2022-04-21 NOTE — Patient Instructions (Signed)
Medication Instructions:  No changes at this time.  *If you need a refill on your cardiac medications before your next appointment, please call your pharmacy*   Lab Work: None  If you have labs (blood work) drawn today and your tests are completely normal, you will receive your results only by: [place] (if you have MyChart) OR A paper copy in the mail If you have any lab test that is abnormal or we need to change your treatment, we will call you to review the results.   Testing/Procedures: Your physician has requested that you have an echocardiogram. Echocardiography is a painless test that uses sound waves to create images of your heart. It provides your doctor with information about the size and shape of your heart and how well your heart's chambers and valves are working. This procedure takes approximately one hour. There are no restrictions for this procedure. Please do NOT wear cologne, perfume, aftershave, or lotions (deodorant is allowed). Please arrive 15 minutes prior to your appointment time.    Follow-Up: At [facility], you and your health needs are our priority.  As part of our continuing mission to provide you with exceptional heart care, we have created designated Provider Care Teams.  These Care Teams include your primary Cardiologist (physician) and Advanced Practice Providers (APPs -  Physician Assistants and Nurse Practitioners) who all work together to provide you with the care you need, when you need it.   Your next appointment:   3 month(s)  Provider:   You may see Glenetta Hew, MD or one of the following Advanced Practice Providers on your designated Care Team:   Murray Hodgkins, NP Christell Faith, PA-C Cadence Kathlen Mody, PA-C Gerrie Nordmann, NP

## 2022-04-23 ENCOUNTER — Encounter: Payer: Self-pay | Admitting: Cardiology

## 2022-04-23 NOTE — Assessment & Plan Note (Signed)
Progressively worsening exertional dyspnea which may very well and simply be because of her recent illnesses and deconditioning, but since she also has palpitations we will assess EF with an echocardiogram.  Could consider CPX to further assess cardiac versus pulmonary issues depending on echo results.

## 2022-04-23 NOTE — Assessment & Plan Note (Addendum)
It is possible that her excessive limb movement could be affecting her sleep.  Not sure if she had a sleep study recently, as sleep apnea could be affecting her level of fatigue as well.  She does not have daytime sleepiness symptoms however.

## 2022-04-23 NOTE — Assessment & Plan Note (Addendum)
Exercise intolerance/fatigue.  His probably is related to deconditioning and recent illnesses as well as weight gain.  Will assess EF by echo, monitor for palpitations with [employer]. Try to stress importance of adequate sleep, and continued exercise.  Will defer further evaluation to PCP.  Hemoglobin levels are stable.  TSH is stable.  Could consider vitamin D evaluation.

## 2022-04-23 NOTE — Assessment & Plan Note (Signed)
Unfortunately, last time she wore monitor, we did not capture anything.  At this point I think it would be futile to place another monitor, I think it would be more effective if she were to purchase a [employer] home monitoring system.  This way which she can capture an episode when they occur and not take a chance that 1 will occur when she is wearing a monitor.  Will try to avoid treating any palpitations medication until we know or treating.  I did want her to avoid excess caffeine and try to get better sleep.  Will try to avoid AV nodal agents if possible.

## 2022-05-12 ENCOUNTER — Ambulatory Visit (INDEPENDENT_AMBULATORY_CARE_PROVIDER_SITE_OTHER): Payer: Self-pay | Admitting: Dermatology

## 2022-05-12 ENCOUNTER — Encounter: Payer: Self-pay | Admitting: Dermatology

## 2022-05-12 DIAGNOSIS — I8393 Asymptomatic varicose veins of bilateral lower extremities: Secondary | ICD-10-CM

## 2022-05-12 NOTE — Patient Instructions (Signed)
BEFORE YOUR APPOINTMENT FOR SCLEROTHERAPY  1. When you telephone for your appointment for the sclerotherapy procedure, please let the receptionist know that you are scheduling for the fifteen (15) minute sclerotherapy procedure not just a regular visit.  2. On the day of the procedure, please cleanse and dry the areas, but do not use any moisturizers or other products on the area(s) to be treated.  3. Bring a pair of comfortable shorts to wear during the procedure.  4. Be sure to bring your recommended graduated compression stockings with you to the office. You will be wearing them home when your visit is over. These compression hose can be purchased at most medical supply stores.  After Your Sclerotherapy Procedure  1. Please wear the graduated compression stockings for 24 hours immediately following the completion of the sclerotherapy procedure.  2. We recommend that you avoid vigorous activity as much as possible for the first twenty-four (24) hours. You can do your "normal" routine, but avoid an above normal amount of time on your feet. Elevating the legs when sitting and avoidance of vigorous leg movements or exercise in the first few days after treatment may improve your results.  3. You may remove the compression dressings (cotton balls) and tape the next morning.  4. Please continue wearing the compression stockings during waking hours for the two (2) weeks following sclerotherapy.  5. If you have any blisters, sores or ulcers or other problems following your procedure please call or return to the office immediately.     THE PROCEDURE FEE IS $350.00 PER FIFTEEN (15) MINUTE SESSION. WE REQUIRE THAT THIS PROCEDURE BE PAID FOR IN FULL ON OR BEFORE THE DATE THAT IT IS PERFORMED. WE WILL GIVE YOU A RECEIPT THAT YOU CAN FILE WITH YOUR INSURANCE COMPANY. WE GENERALLY DO NOT FILE THIS PROCEDURE WITH ANY INSURANCE COMPANY EXCEPT UNDER CERTAIN CIRCUMSTANCES WHERE PRIOR AUTHORIZATION HAS BEEN  CONFIRMED. THIS PROCEDURE IS GENERALLY CONSIDERED TO BE A COSMETIC PROCEDURE BY INSURANCE COMPANIES.

## 2022-05-12 NOTE — Progress Notes (Signed)
   Follow-Up Visit   Subjective  Belinda Martin is a 47 y.o. female who presents for the following: Procedure (Patient here today for sclerotherapy. ).  The following portions of the chart were reviewed this encounter and updated as appropriate:   Tobacco  Allergies  Meds  Problems  Med Hx  Surg Hx  Fam Hx      Review of Systems:  No other skin or systemic complaints except as noted in HPI or Assessment and Plan.  Objective  Well appearing patient in no apparent distress; mood and affect are within normal limits.  A focused examination was performed including legs. Relevant physical exam findings are noted in the Assessment and Plan.    Assessment & Plan  Varicose veins of both lower extremities, unspecified whether complicated bilateral legs  Pt with no history of clots.   Start aspirin 81 mg qd x 3 weeks Continue compression stockings daily x 3 weeks  Reviewed signs of DVT including swelling and pain. Reviewed signs of PE including shortness of breath, fast heart rate.  Sclerotherapy - bilateral legs The patient presents for desired sclerotherapy for desired treatment of desired treatment of small to medium varicosities of the bilateral legs.  Procedure: The patient was counseled and understands about the effects, side effects and potential risks and complications of the sclerotherapy procedure. The patient was given the opportunity to ask questions. Asclera (polidocanol) 1% (total 6 cc) was injected into the varices. In order to ensure correct placement of the catheter in the vein, I drew back slightly to give moderate blood show in the syringe. If there was any evidence or suspicion of extravasation of sclerosant, the area was immediately diluted with a large volume of 0.9% saline. A pressure dressing was applied immediately to the injected sites. The patient tolerated the procedure well without complication. The patient was instructed in post-operative compression  stocking use. The patient understands to call or return immediately if any problems noted.      Return for as scheduled.  Graciella Belton, RMA, am acting as scribe for Forest Gleason, MD .  Documentation: I have reviewed the above documentation for accuracy and completeness, and I agree with the above.  Forest Gleason, MD

## 2022-05-15 ENCOUNTER — Encounter: Payer: Self-pay | Admitting: Dermatology

## 2022-05-24 ENCOUNTER — Encounter: Payer: Self-pay | Admitting: Dermatology

## 2022-05-30 ENCOUNTER — Ambulatory Visit: Payer: Commercial Managed Care - PPO | Attending: Cardiology

## 2022-05-30 DIAGNOSIS — R0609 Other forms of dyspnea: Secondary | ICD-10-CM

## 2022-05-30 HISTORY — PX: TRANSTHORACIC ECHOCARDIOGRAM: SHX275

## 2022-05-30 LAB — ECHOCARDIOGRAM COMPLETE
Area-P 1/2: 5.58 cm2
S' Lateral: 2.9 cm

## 2022-05-30 MED ORDER — PERFLUTREN LIPID MICROSPHERE
1.0000 mL | INTRAVENOUS | Status: AC | PRN
  Administered 2022-05-30: 6 mL via INTRAVENOUS

## 2022-06-01 DIAGNOSIS — M5136 Other intervertebral disc degeneration, lumbar region: Secondary | ICD-10-CM | POA: Diagnosis not present

## 2022-06-01 DIAGNOSIS — M160 Bilateral primary osteoarthritis of hip: Secondary | ICD-10-CM | POA: Diagnosis not present

## 2022-06-05 NOTE — Progress Notes (Unsigned)
[name], MD   No chief complaint on file.   HPI:      [name] is a 47 y.o. G1P0010 whose LMP was No LMP recorded. (Menstrual status: Oral contraceptives)., presents today for ***    Patient Active Problem List   Diagnosis Date Noted   DOE (dyspnea on exertion) 04/21/2022   Fatigue 04/21/2022   Abnormal mammogram of left breast 06/23/2020   Rapid palpitations 05/11/2020   MTHFR gene mutation    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   Pseudotumor cerebri    Sleep disorder    Obesity    Irritable bowel syndrome    DDD (degenerative disc disease), lumbar    Environmental and seasonal allergies 07/21/2013   Asthma 01/15/2009   History of abnormal cervical Pap smear 04/05/2007    Past Surgical History:  Procedure Laterality Date   Electrocautery of condyloma accuminata  09/2007   Dr Rayford Halsted   NASAL SINUS SURGERY     TONSILLECTOMY     TRANSTHORACIC ECHOCARDIOGRAM  10/10   EF 65-70%, normal valves    Family History  Problem Relation Age of Onset   Arrhythmia Mother    Heart murmur Mother    Endometriosis Mother    Fibromyalgia Mother    Irritable bowel syndrome Mother    Deep vein thrombosis Father        MTHFR homozygous   Atrial fibrillation Father    Hypertension Father    Heart failure Paternal Grandfather        CHF;   Stomach cancer Paternal Grandfather    Crohn's disease Maternal Grandmother    Diabetes Maternal Grandmother    Heart murmur Paternal Uncle    Coronary artery disease Neg Hx        premature CAD   Breast cancer Neg Hx     Social History   Socioeconomic History   Marital status: Married    Spouse name: Not on file   Number of children: 0   Years of education: Not on file   Highest education level: Not on file  Occupational History   Occupation: Mental health assessments    Comment: [employer]  Tobacco Use   Smoking status: Never   Smokeless tobacco: Never  Vaping Use   Vaping Use: Never used   Substance and Sexual Activity   Alcohol use: Yes    Comment: rare   Drug use: No   Sexual activity: Yes    Partners: Male    Birth control/protection: Pill  Other Topics Concern   Not on file  Social History Narrative   Remarried; lives in [place]   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       She has lost about 90 pounds in the last year-dietary modification and try to increase exercise level.   Her new husband has encouraged her to increase her exercise => she walks most days.  They go hiking on the weekends.     Social Determinants of Health   Financial Resource Strain: Not on file  Food Insecurity: Not on file  Transportation Needs: Not on file  Physical Activity: Inactive (05/29/2017)   Exercise Vital Sign    Days of Exercise per Week: 0 days    Minutes of Exercise per Session: 0 min  Stress: Stress Concern Present (05/29/2017)   [place]    Feeling of Stress : To some extent  Social Connections:  Not on file  Intimate Partner Violence: Not on file    Outpatient Medications Prior to Visit  Medication Sig Dispense Refill   albuterol (VENTOLIN HFA) 108 (90 Base) MCG/ACT inhaler Inhale 2 puffs into the lungs every 6 (six) hours as needed for wheezing or shortness of breath. 8 g 1   Azelaic Acid 15 % gel After skin is thoroughly washed and patted dry, gently but thoroughly massage a thin film of azelaic acid cream into the affected area twice daily, in the morning and evening. 50 g 3   azithromycin (ZITHROMAX) 250 MG tablet 2 tabs poqday1, 1 tab poqday 2-5 (Patient not taking: Reported on 04/21/2022) 6 tablet 0   Biotin 10 MG TABS Take by mouth daily.     clindamycin (CLINDAGEL) 1 % gel Apply twice daily to affected area as needed (Patient not taking: Reported on 04/21/2022) 30 g 1   clotrimazole-betamethasone (LOTRISONE) cream Apply externally BID prn sx up to 2 wks (Patient not taking:  Reported on 04/21/2022) 15 g 0   Drospirenone (SLYND) 4 MG TABS Take 1 tablet by mouth daily. 84 tablet 3   fexofenadine (ALLEGRA) 180 MG tablet Take 180 mg by mouth daily.     folic acid (FOLVITE) 1 MG tablet Take 3 mg by mouth daily.     Glucosamine 500 MG CAPS Take 1,000 mg by mouth daily.     HYDROcodone bit-homatropine (HYCODAN) 5-1.5 MG/5ML syrup Take 5 mLs by mouth every 8 (eight) hours as needed for cough. (Patient not taking: Reported on 02/07/2022) 120 mL 0   hydrocortisone 2.5 % cream Apply topically 2 (two) times daily as needed (Rash). To area at abdomen for up to 2 weeks. Avoid applying to face, groin, and axilla. Use as directed. Risk of skin atrophy with long-term use reviewed. 30 g 1   hydrocortisone 2.5 % cream Apply twice daily to affected area as needed for irritation. 30 g 1   Ibuprofen-Famotidine (DUEXIS) 800-26.6 MG TABS Take 1 tablet by mouth as needed.     Multiple Vitamins-Calcium (ONE-A-DAY WOMENS FORMULA PO) Take 1 tablet by mouth daily.     Omega-3 Fatty Acids (FISH OIL) 1000 MG CAPS Take 2 capsules by mouth daily.     predniSONE (DELTASONE) 20 MG tablet 3 tabs poqday 1-2, 2 tabs poqday 3-4, 1 tab poqday 5-6 (Patient not taking: Reported on 04/21/2022) 12 tablet 0   tiZANidine (ZANAFLEX) 4 MG tablet Take 1 tablet (4 mg total) by mouth every 6 (six) hours as needed for muscle spasms. 30 tablet 1   TRIAMCINOLONE ACETONIDE, TOP, (TRIANEX) 0.05 % OINT Apply twice daily to hands for up to 2 weeks then weekends only. Avoid applying to face, groin, and axilla. Use as directed. Risk of skin atrophy with long-term use reviewed. 110 g 1   vitamin B-12 (CYANOCOBALAMIN) 500 MCG tablet Take 1,000 mcg by mouth daily.     No facility-administered medications prior to visit.      ROS:  Review of Systems BREAST: No symptoms   OBJECTIVE:   Vitals:  There were no vitals taken for this visit.  Physical Exam  Results: No results found for this or any previous visit (from the  past 24 hour(s)).   Assessment/Plan: No diagnosis found.    No orders of the defined types were placed in this encounter.     No follow-ups on file.  Trevon Strothers B. Dmiya Malphrus, PA-C 06/05/2022 7:52 PM

## 2022-06-06 ENCOUNTER — Encounter: Payer: Self-pay | Admitting: Obstetrics and Gynecology

## 2022-06-06 ENCOUNTER — Ambulatory Visit: Payer: Commercial Managed Care - PPO | Admitting: Obstetrics and Gynecology

## 2022-06-06 VITALS — BP 108/64 | Ht 67.0 in | Wt 260.0 lb

## 2022-06-06 DIAGNOSIS — R000 Tachycardia, unspecified: Secondary | ICD-10-CM

## 2022-06-06 DIAGNOSIS — Z3169 Encounter for other general counseling and advice on procreation: Secondary | ICD-10-CM

## 2022-06-06 DIAGNOSIS — Z1589 Genetic susceptibility to other disease: Secondary | ICD-10-CM

## 2022-06-06 DIAGNOSIS — M5137 Other intervertebral disc degeneration, lumbosacral region: Secondary | ICD-10-CM

## 2022-06-06 NOTE — Patient Instructions (Signed)
I value your feedback and you entrusting us with your care. If you get a [place] patient survey, I would appreciate you taking the time to let us know about your experience today. Thank you! ? ? ?

## 2022-06-09 ENCOUNTER — Telehealth: Payer: Self-pay | Admitting: Obstetrics and Gynecology

## 2022-06-09 NOTE — Telephone Encounter (Signed)
Left message for patient to call office back to r/s appt on 09/05/22 with ABC. She will be out of the office that week. Sent [facility] message.

## 2022-06-13 NOTE — Telephone Encounter (Signed)
Left voicemail for patient to return call. Need to advise of MFM appointment. Patient has not viewed my chart notification.

## 2022-06-13 NOTE — Telephone Encounter (Signed)
The patient called to rescheduled for 6/10 with ABC

## 2022-06-15 ENCOUNTER — Ambulatory Visit: Payer: Commercial Managed Care - PPO | Admitting: Dermatology

## 2022-06-15 DIAGNOSIS — L304 Erythema intertrigo: Secondary | ICD-10-CM

## 2022-06-15 DIAGNOSIS — L089 Local infection of the skin and subcutaneous tissue, unspecified: Secondary | ICD-10-CM | POA: Diagnosis not present

## 2022-06-15 MED ORDER — KETOCONAZOLE 2 % EX CREA: 1.0000 | TOPICAL_CREAM | Freq: Two times a day (BID) | CUTANEOUS | 2 refills | Status: AC

## 2022-06-15 MED ORDER — DOXYCYCLINE MONOHYDRATE 100 MG PO CAPS: 100.0000 mg | ORAL_CAPSULE | Freq: Two times a day (BID) | ORAL | 0 refills | Status: AC

## 2022-06-15 MED ORDER — HYDROCORTISONE 2.5 % EX CREA: TOPICAL_CREAM | Freq: Two times a day (BID) | CUTANEOUS | 1 refills | Status: AC | PRN

## 2022-06-15 NOTE — Patient Instructions (Addendum)
For Intertrigo (Rash at folds) - Start Ketoconazole 2% cream twice a day to affected areas when you have the rash - Start Hydrocortisone 2.5% cream/lotion twice a day to affected areas for up to 2 weeks when you have the rash. This can thin and lighten the area and cause stretch marks if it is used too often, so only use it for short periods when you have the rash. - Recommend antifungal powder such as Zeasorb AF daily for prevention  Start doxycycline monohydrate 100 mg twice daily with food x 7 days.  Doxycycline should be taken with food to prevent nausea. Do not lay down for 30 minutes after taking. Be cautious with sun exposure and use good sun protection while on this medication. Pregnant women should not take this medication.    Due to recent changes in healthcare laws, you may see results of your pathology and/or laboratory studies on MyChart before the doctors have had a chance to review them. We understand that in some cases there may be results that are confusing or concerning to you. Please understand that not all results are received at the same time and often the doctors may need to interpret multiple results in order to provide you with the best plan of care or course of treatment. Therefore, we ask that you please give [place] 2 business days to thoroughly review all your results before contacting the office for clarification. Should we see a critical lab result, you will be contacted sooner.   If You Need Anything After Your Visit  If you have any questions or concerns for your doctor, please call our main line at [phone] and press option 4 to reach your doctor's medical assistant. If no one answers, please leave a voicemail as directed and we will return your call as soon as possible. Messages left after 4 pm will be answered the following business day.   You may also send [place] a message via [place]. We typically respond to MyChart messages within 1-2 business days.  For  prescription refills, please ask your pharmacy to contact our office. Our fax number is [phone].  If you have an urgent issue when the clinic is closed that cannot wait until the next business day, you can page your doctor at the number below.    Please note that while we do our best to be available for urgent issues outside of office hours, we are not available 24/7.   If you have an urgent issue and are unable to reach [place], you may choose to seek medical care at your doctor's office, retail clinic, urgent care center, or emergency room.  If you have a medical emergency, please immediately call 911 or go to the emergency department.  Pager Numbers  - Dr. Nehemiah Massed: [phone]  - Dr. Laurence Ferrari: [phone]  - Dr. Nicole Kindred: [phone]  In the event of inclement weather, please call our main line at [phone] for an update on the status of any delays or closures.  Dermatology Medication Tips: Please keep the boxes that topical medications come in in order to help keep track of the instructions about where and how to use these. Pharmacies typically print the medication instructions only on the boxes and not directly on the medication tubes.   If your medication is too expensive, please contact our office at [phone] option 4 or send [place] a message through [place].   We are unable to tell what your co-pay for medications will be in advance as this is  different depending on your insurance coverage. However, we may be able to find a substitute medication at lower cost or fill out paperwork to get insurance to cover a needed medication.   If a prior authorization is required to get your medication covered by your insurance company, please allow [place] 1-2 business days to complete this process.  Drug prices often vary depending on where the prescription is filled and some pharmacies may offer cheaper prices.  The website www.goodrx.com contains coupons for medications through different  pharmacies. The prices here do not account for what the cost may be with help from insurance (it may be cheaper with your insurance), but the website can give you the price if you did not use any insurance.  - You can print the associated coupon and take it with your prescription to the pharmacy.  - You may also stop by our office during regular business hours and pick up a GoodRx coupon card.  - If you need your prescription sent electronically to a different pharmacy, notify our office through [facility] or by phone at [phone] option 4.     Si Usted Necesita Algo Despus de Su Visita  Tambin puede enviarnos un mensaje a travs de Pharmacist, community. Por lo general respondemos a los mensajes de MyChart en el transcurso de 1 a 2 das hbiles.  Para renovar recetas, por favor pida a su farmacia que se ponga en contacto con nuestra oficina. Harland Dingwall de fax es [place] [phone].  Si tiene un asunto urgente cuando la clnica est cerrada y que no puede esperar hasta el siguiente da hbil, puede llamar/localizar a su doctor(a) al nmero que aparece a continuacin.   Por favor, tenga en cuenta que aunque hacemos todo lo posible para estar disponibles para asuntos urgentes fuera del horario de [place], no estamos disponibles las 24 horas del da, los 7 das de la National.   Si tiene un problema urgente y no puede comunicarse con nosotros, puede optar por buscar atencin mdica  en el consultorio de su doctor(a), en una clnica privada, en un centro de atencin urgente o en una sala de emergencias.  Si tiene Engineering geologist, por favor llame inmediatamente al 911 o vaya a la sala de emergencias.  Nmeros de bper  - Dr. Nehemiah Massed: [phone]  - Dra. Moye: [phone]  - Dra. Nicole Kindred: [phone]  En caso de inclemencias del [place], por favor llame a [place]ie Kindred principal al [phone] para una actualizacin sobre el [place] de cualquier retraso o cierre.  Consejos para la  medicacin en dermatologa: Por favor, guarde las cajas en las que vienen los medicamentos de uso tpico para ayudarle a seguir las instrucciones sobre dnde y cmo usarlos. Las farmacias generalmente imprimen las instrucciones del medicamento slo en las cajas y no directamente en los tubos del [place].   Si su medicamento es muy caro, por favor, pngase en contacto con Zigmund Daniel llamando al [phone] y presione la opcin 4 o envenos un mensaje a travs de Pharmacist, community.   No podemos decirle cul ser su copago por los medicamentos por adelantado ya que esto es diferente dependiendo de la cobertura de su seguro. Sin embargo, es posible que podamos encontrar un medicamento sustituto a Electrical engineer un formulario para que el seguro cubra el medicamento que se considera necesario.   Si se requiere una autorizacin previa para que su compaa de seguros [place] su medicamento, por favor permtanos de 1 a 2 das hbiles para completar  este proceso.  Los precios de los medicamentos varan con frecuencia dependiendo del Environmental consultant de dnde se surte la receta y alguna farmacias pueden ofrecer precios ms baratos.  El sitio web www.goodrx.com tiene cupones para medicamentos de Airline pilot. Los precios aqu no tienen en cuenta lo que podra costar con la ayuda del seguro (puede ser ms barato con su seguro), pero el sitio web puede darle el precio si no utiliz Research scientist (physical sciences).  - Puede imprimir el cupn correspondiente y llevarlo con su receta a la farmacia.  - Tambin puede pasar por nuestra oficina durante el horario de atencin regular y Charity fundraiser una tarjeta de cupones de GoodRx.  - Si necesita que su receta se enve electrnicamente a una farmacia diferente, informe a nuestra oficina a travs de MyChart de [place] o por telfono llamando al [phone] y presione la opcin 4.

## 2022-06-15 NOTE — Progress Notes (Signed)
Follow-Up Visit   Subjective  Belinda Martin is a 47 y.o. female who presents for the following: Follow-up (Patient here today for 4 week sclerotherapy follow up. ).  Patient is pleased with results and has noticed improvement and flattening with the veins.   Patient also with a boil at pantyline that came up Monday and has been painful. No drainage.   The following portions of the chart were reviewed this encounter and updated as appropriate:   Tobacco  Allergies  Meds  Problems  Med Hx  Surg Hx  Fam Hx      Review of Systems:  No other skin or systemic complaints except as noted in HPI or Assessment and Plan.  Objective  Well appearing patient in no apparent distress; mood and affect are within normal limits.  A focused examination was performed including legs, groin. Relevant physical exam findings are noted in the Assessment and Plan.  groin Erythematous papule with small central focus of purulent discharge and no underlying fluctuance  Right Medial Thigh Erythematous macerated patches    Assessment & Plan  Local infection of skin and subcutaneous tissue groin  Drained today with gentle pressure  Start doxycycline monohydrate 100 mg twice daily with food x 7 days.  Doxycycline should be taken with food to prevent nausea. Do not lay down for 30 minutes after taking. Be cautious with sun exposure and use good sun protection while on this medication. Pregnant women should not take this medication.    doxycycline (MONODOX) 100 MG capsule - groin Take 1 capsule (100 mg total) by mouth 2 (two) times daily for 7 days.  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 Condition is symptomatic/ bothersome to patient. Not currently at goal.  Intertrigo is a chronic recurrent rash that occurs in skin fold areas  that may be associated with friction; heat; moisture; yeast; fungus; and bacteria.  It is exacerbated by increased movement / activity; sweating; and higher atmospheric temperature.  For Intertrigo (Rash at folds) - Start Ketoconazole 2% cream twice a day to affected areas when you have the rash - Start Hydrocortisone 2.5% cream/lotion twice a day to affected areas for up to 2 weeks when you have the rash. This can thin and lighten the area and cause stretch marks if it is used too often, so only use it for short periods when you have the rash. - Recommend antifungal powder such as Zeasorb AF daily for prevention   ketoconazole (NIZORAL) 2 % cream - Right Medial Thigh Apply 1 Application topically 2 (two) times daily.  Related Medications hydrocortisone 2.5 % cream Apply topically 2 (two) times daily as needed (Rash). To area inner thighs and folds for up to 2 weeks. Avoid applying to face, groin, and axilla. Use as directed. Risk of skin atrophy with long-term use reviewed.   Varicose Veins/Spider Veins - Dilated blue, purple or red veins at the lower extremities improved s/p sclerotherapy 4 weeks ago - Patient did have some darkening with procedure but advised patient that it should lighten up over time - Smaller vessels can be treated by sclerotherapy (a procedure to inject a medicine into the veins to make them disappear) if desired, but the treatment is not covered by insurance. Larger vessels may be covered if symptomatic and we would refer to vascular surgeon if treatment desired.  Return in about  2 months (around 08/15/2022) for intertrigo, melasma.  Graciella Belton, RMA, am acting as scribe for Forest Gleason, MD .  Documentation: I have reviewed the above documentation for accuracy and completeness, and I agree with the above.  Forest Gleason, MD

## 2022-06-19 ENCOUNTER — Encounter: Payer: Self-pay | Admitting: Dermatology

## 2022-06-20 ENCOUNTER — Encounter: Payer: Self-pay | Admitting: Dermatology

## 2022-06-20 ENCOUNTER — Ambulatory Visit: Payer: Commercial Managed Care - PPO

## 2022-06-23 ENCOUNTER — Other Ambulatory Visit: Payer: Self-pay

## 2022-06-23 ENCOUNTER — Telehealth: Payer: Self-pay

## 2022-06-23 LAB — ANAEROBIC AND AEROBIC CULTURE

## 2022-06-23 MED ORDER — GENTAMICIN SULFATE 0.1 % EX OINT: TOPICAL_OINTMENT | CUTANEOUS | 0 refills | Status: DC

## 2022-06-23 NOTE — Telephone Encounter (Signed)
-----   Message from [place], MD sent at 06/23/2022  1:46 PM EDT ----- Abscess site did not grow any bacteria  Nose grew Citrobacter freundii which can sometimes cause infections. People who carry bacteria like this in the nose could have higher risk of infections from it. Recommend gentamicin cream 3 times a day for 7 days to the inside of the nose to try and get rid of the bacteria.   MAs please call. Thank you!

## 2022-06-23 NOTE — Progress Notes (Addendum)
Patient advised culture at nose grew bacteria, sent in gentamicin to use 3 times daily for 7 days to inside of nose.  Lurlean Horns., RMA

## 2022-06-29 DIAGNOSIS — M7062 Trochanteric bursitis, left hip: Secondary | ICD-10-CM | POA: Diagnosis not present

## 2022-06-29 DIAGNOSIS — M5136 Other intervertebral disc degeneration, lumbar region: Secondary | ICD-10-CM | POA: Diagnosis not present

## 2022-07-13 ENCOUNTER — Institutional Professional Consult (permissible substitution): Payer: Commercial Managed Care - PPO

## 2022-07-21 ENCOUNTER — Ambulatory Visit: Payer: Commercial Managed Care - PPO | Attending: Cardiology | Admitting: Cardiology

## 2022-07-21 ENCOUNTER — Encounter: Payer: Self-pay | Admitting: Cardiology

## 2022-07-21 VITALS — BP 112/72 | HR 82 | Ht 67.0 in | Wt 257.0 lb

## 2022-07-21 DIAGNOSIS — R0609 Other forms of dyspnea: Secondary | ICD-10-CM | POA: Diagnosis not present

## 2022-07-21 DIAGNOSIS — J3089 Other allergic rhinitis: Secondary | ICD-10-CM | POA: Diagnosis not present

## 2022-07-21 DIAGNOSIS — G479 Sleep disorder, unspecified: Secondary | ICD-10-CM

## 2022-07-21 DIAGNOSIS — R002 Palpitations: Secondary | ICD-10-CM | POA: Diagnosis not present

## 2022-07-21 DIAGNOSIS — Z6841 Body Mass Index (BMI) 40.0 and over, adult: Secondary | ICD-10-CM

## 2022-07-21 NOTE — Progress Notes (Signed)
Primary Care Provider: Donita Brooks, MD [facility] Cardiologist: Bryan Lemma, MD Electrophysiologist: None  Clinic Note: Chief Complaint  Patient presents with   Follow-up    3 months.  Review echo results.   ===================================  ASSESSMENT/PLAN   Problem List Items Addressed This Visit     Sleep disorder   Rapid palpitations (Chronic)    Not really bothering her as much.  I think avoiding triggers and staying adequately hydrated is important. She is not interested in using a monitor but I think a Kardia-Mobile portable monitor (if not smart watch) would be helpful.      Relevant Orders   EKG 12-Lead (Completed)   Environmental and seasonal allergies   DOE (dyspnea on exertion) - Primary (Chronic)    Echocardiogram was relatively normal.  Would like to exclude cardiac versus pulmonary etiology.  Depending on results could go further.  Plan: CPX (cardiopulmonary stress test)   Shared Decision Making/Informed Consent The risks [chest pain, shortness of breath, cardiac arrhythmias, dizziness, blood pressure fluctuations, myocardial infarction, stroke/transient ischemic attack, and life-threatening complications (estimated to be 1 in 10,000)], benefits (risk stratification, diagnosing coronary artery disease, treatment guidance) and alternatives of an exercise tolerance test were discussed in detail with Ms. Kingsbury and she agrees to proceed.       Relevant Orders   EKG 12-Lead (Completed)   Cardiac Stress Test: Informed Consent Details: Physician/Practitioner Attestation; Transcribe to consent form and obtain patient signature   Cardiopulmonary exercise test   Class 3 severe obesity without serious comorbidity with body mass index (BMI) of 45.0 to 49.9 in adult, unspecified obesity type (HCC) (Chronic)    I am sure her weight is playing a role in her dyspnea on exertion. Less active now admitted for is more sedentary/deconditioned owing to  her dyspnea as well. Stressed importance of weight loss.       Relevant Orders   EKG 12-Lead (Completed)   Cardiac Stress Test: Informed Consent Details: Physician/Practitioner Attestation; Transcribe to consent form and obtain patient signature   ===================================  HPI:    Belinda Martin is an obese 47 y.o. female with a PMH notable for ADHD, fatigue and palpitations (Zio patch monitor with PACs and short PAT runs) who presents today for 42-month follow-up at the request of Dr. Shelly Coss was last seen on January routine, 2024 => doing relatively well but does get having a lot of getting over COVID.  Noting more exertional dyspnea and fatigue.  Also still having intermittent flutter palpitation symptoms.  Less often but maybe once or twice a week lasting less than a minute.  No other associated symptoms.  She was reluctant to wait on the monitor.  Mostly concerned with chronic fatigue and dyspnea felt to be persistent since her COVID.  She also can out of the habit of exercising while she was sick.  She actually had had a non-COVID URI leading up to her diagnosis of COVID.  She was able to previously do 8 mile walks without issues but now down to 3.  No chest pain or pressure. 2D echo ordered. Plan was to follow-up in 3 months to reassess symptoms, and review echo. Plan was to then consider CPX depending on echo findings and status of symptoms. Also discussed possibility of Kardia-Mobile to reassess palpitations. Stressed the need for adequate hydration sleep and continued exercise.[name]  Recent Hospitalizations: No hospitalizations.  Had COVID late October early November.  Positive D-dimer but negative PE  protocol CT  Reviewed  CV studies:    The following studies were reviewed today: (if available, images/films reviewed: From Epic Chart or Care Everywhere) TTE 05/30/2022: NORMAL ECHO.  EF 60 to 65% no RWMA. " Normal diastolic parameters".  Normal RV size  function and pressures.  Normal valves.  Normal RAP.  Interval History:   KESHANA KLEMZ returns today indicating that she still has exertional dyspnea.  Simply walking in from the car.  This is unusual for her.  She is not noting any chest tightness or pressure.  Not really noticing much in the way of any significant palpitations or fluttering-still present, but not frequent.  Still usually wake her up from sleep..  No lightheadedness or dizziness. Frustrated because she used to be able to do 8 mile hikes and now cannot walk more than half a mile without getting short of breath.  Sometimes even less than that.  Still notes lots of social stress.  Cardiovascular ROS: positive for - dyspnea on exertion, palpitations, and exercise intolerance, fatigue negative for - chest pain, edema, orthopnea, paroxysmal nocturnal dyspnea, rapid heart rate, shortness of breath, or syncope/near syncope or TIA/amaurosis fugax or claudication.    Thankfully, her weight is stable.  Pay more attention to what she eats.  REVIEWED OF SYSTEMS   Pertinent symptoms noted above.  Otherwise review symptoms are negative not noted above. Weight is stable. Gynecologist asked about iron supplementation.  I have reviewed and (if needed) personally updated the patient's problem list, medications, allergies, past medical and surgical history, social and family history.   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  EF 60 to 65% no RWMA. " Normal diastolic parameters".  Normal RV size function and pressures.  Normal valves.  Normal RAP.    Immunization History  Administered Date(s) Administered   Influenza,inj,Quad PF,6+ Mos 12/29/2014, 12/21/2015, 05/26/2017, 01/29/2018, 02/08/2019   PFIZER(Purple Top)SARS-COV-2 Vaccination 10/01/2019, 10/22/2019   Tdap 07/05/2013, 07/23/2021    MEDICATIONS/ALLERGIES   Current Meds  Medication Sig   albuterol (VENTOLIN HFA) 108 (90 Base) MCG/ACT inhaler Inhale 2 puffs into the lungs every 6 (six) hours as needed for wheezing or shortness of breath.   Azelaic Acid 15 % gel After skin is thoroughly washed and patted dry, gently but thoroughly massage a thin film of azelaic acid cream into the affected area twice daily, in the morning and evening.   Biotin 10  MG TABS Take by mouth daily.   clotrimazole-betamethasone (LOTRISONE) cream Apply externally BID prn sx up to 2 wks   Drospirenone (SLYND) 4 MG TABS Take 1 tablet by mouth daily.   fexofenadine (ALLEGRA) 180 MG tablet Take 180 mg by mouth daily.   folic acid (FOLVITE) 1 MG tablet Take 3 mg by mouth daily.   Glucosamine 500 MG CAPS Take 1,000 mg by mouth daily.   hydrocortisone 2.5 % cream Apply topically 2 (two) times daily as needed (Rash). To area inner thighs and folds for up to 2 weeks. Avoid applying to face, groin, and axilla. Use as directed. Risk of skin atrophy with long-term use reviewed.   Ibuprofen-Famotidine (DUEXIS) 800-26.6 MG TABS Take 1 tablet by mouth as needed.   ketoconazole (NIZORAL) 2 % cream Apply 1  Application topically 2 (two) times daily.   Multiple Vitamins-Calcium (ONE-A-DAY WOMENS FORMULA PO) Take 1 tablet by mouth daily.   Omega-3 Fatty Acids (FISH OIL) 1000 MG CAPS Take 2 capsules by mouth daily.   tiZANidine (ZANAFLEX) 4 MG tablet Take 1 tablet (4 mg total) by mouth every 6 (six) hours as needed for muscle spasms.   TRIAMCINOLONE ACETONIDE, TOP, (TRIANEX) 0.05 % OINT Apply twice daily to hands for up to 2 weeks then weekends only. Avoid applying to face, groin, and axilla. Use as directed. Risk of skin atrophy with long-term use reviewed.   vitamin B-12 (CYANOCOBALAMIN) 500 MCG tablet Take 1,000 mcg by mouth daily.    Allergies  Allergen Reactions   Amoxicillin     REACTION: hives    SOCIAL HISTORY/FAMILY HISTORY   Reviewed in Epic:  Pertinent findings:  Social History   Tobacco Use   Smoking status: Never   Smokeless tobacco: Never  Vaping Use   Vaping Use: Never used  Substance Use Topics   Alcohol use: Not Currently    Comment: rare   Drug use: No   Social History   Social History Narrative   Remarried; lives in [place]      She has lost about 90 pounds in the last year-dietary modification and try to increase exercise level.   Her new husband has encouraged her to increase her exercise => she walks most days.  They go hiking on the weekends.      OBJCTIVE -PE, EKG, labs   Wt Readings from Last 3 Encounters:  07/25/22 255 lb (115.7 kg)  07/21/22 257 lb (116.6 kg)  06/06/22 260 lb (117.9 kg)    Physical Exam: Physical Exam Vitals reviewed.  Constitutional:      General: She is not in acute distress.    Appearance: Normal appearance. She is obese. She is not ill-appearing or toxic-appearing.  HENT:     Head: Normocephalic and atraumatic.  Neck:     Vascular: No carotid bruit.  Cardiovascular:     Rate and Rhythm: Normal rate and regular rhythm. No extrasystoles are present.    Chest Wall: PMI is not displaced.     Pulses: Normal pulses.      Heart sounds: S1 normal and S2 normal. Heart sounds are distant. No murmur heard.    No gallop.  Pulmonary:     Effort: Pulmonary effort is normal. No respiratory distress.     Breath sounds: Normal breath sounds. No wheezing, rhonchi or rales.  Chest:     Chest wall: No tenderness.  Musculoskeletal:        General: No swelling. Normal range of motion.  Cervical back: Normal range of motion and neck supple.  Skin:    General: Skin is warm and dry.  Neurological:     General: No focal deficit present.     Mental Status: She is alert and oriented to person, place, and time.  Psychiatric:        Mood and Affect: Mood normal.     Adult ECG Report  Rate: 87 ;  Rhythm: normal sinus rhythm and normal axis, intervals and durations. ;   Narrative Interpretation: Stable  Recent Labs: Reviewed. No results found for: "CHOL", "HDL", "LDLCALC", "LDLDIRECT", "TRIG", "CHOLHDL" Lab Results  Component Value Date   CREATININE 0.91 02/07/2022   BUN 13 02/07/2022   NA 142 02/07/2022   K 4.1 02/07/2022   CL 106 02/07/2022   CO2 21 02/07/2022      Latest Ref Rng & Units 02/07/2022    4:07 PM 05/07/2020    9:28 AM 02/08/2019   12:19 PM  CBC  WBC 3.4 - 10.8 x10E3/uL 8.3  6.7  8.9   Hemoglobin 11.1 - 15.9 g/dL 40.9  81.1  91.4   Hematocrit 34.0 - 46.6 % 42.6  44.5  41.2   Platelets 150 - 450 x10E3/uL 295  269  301     Lab Results  Component Value Date   HGBA1C 5.7 (H) 02/07/2022   Lab Results  Component Value Date   TSH 3.590 02/07/2022    ================================================== I spent a total of 22 minutes with the patient spent in direct patient consultation.  Additional time spent with chart review  / charting (studies, outside notes, etc): 15 min Total Time: 37 min  Current medicines are reviewed at length with the patient today.  (+/- concerns) none  Notice: This dictation was prepared with Dragon dictation along with smart phrase technology. Any transcriptional  errors that result from this process are unintentional and may not be corrected upon review.  Studies Ordered:  Orders Placed This Encounter  Procedures   Cardiopulmonary exercise test   Cardiac Stress Test: Informed Consent Details: Physician/Practitioner Attestation; Transcribe to consent form and obtain patient signature   EKG 12-Lead   No orders of the defined types were placed in this encounter.  Shared Decision Making/Informed Consent The risks [chest pain, shortness of breath, cardiac arrhythmias, dizziness, blood pressure fluctuations, myocardial infarction, stroke/transient ischemic attack, and life-threatening complications (estimated to be 1 in 10,000)], benefits (risk stratification, diagnosing coronary artery disease, treatment guidance) and alternatives of an exercise tolerance test were discussed in detail with Ms. Finks and she agrees to proceed.   Patient Instructions / Medication Changes & Studies & Tests Ordered   Patient Instructions  Medication Instructions:  No changes at this time.   *If you need a refill on your cardiac medications before your next appointment, please call your pharmacy*   Lab Work: None  If you have labs (blood work) drawn today and your tests are completely normal, you will receive your results only by: MyChart Message (if you have MyChart) OR A paper copy in the mail If you have any lab test that is abnormal or we need to change your treatment, we will call you to review the results.   Testing/Procedures: Your physician has recommended that you have a cardiopulmonary stress test (CPX). CPX testing is a non-invasive measurement of heart and lung function. It replaces a traditional treadmill stress test. This type of test provides a tremendous amount of information that relates not only to your present condition but  also for future outcomes. This test combines measurements of you ventilation, respiratory gas exchange in the lungs,  electrocardiogram (EKG), blood pressure and physical response before, during, and following an exercise protocol.   Cardiopulmonary Stress Test (CPX)   Your physician has recommended that you have a cardiopulmonary stress test (CPX). CPX testing is a non-invasive measurement of heart and lung function. It replaces a traditional treadmill stress test. This type of test provides a tremendous amount of information that relates not only to your present condition but also for future outcomes. This test combines measurements of you ventilation, respiratory gas exchange in the lungs, electrocardiogram (EKG), blood pressure and physical response before, during, and following an exercise protocol.  Date : TBD Time : TBD  (arrive 30 min prior to scheduled appt time) Location: [facility]   [address], Main entrance [address], Gate Code ( )  Expect to be in the lab for 2 hours.   Instructions 1- Eat a light meal prior to test. No alcohol, caffeine, or nicotine products within 2 hours of test. (Avoid heavy meal) 2- Avoid significant exertion or exercise 24 hours prior to test. 3- Be prepared to exercise and sweat. Wear loose fitting, short sleeved shirt.  4- Bring a list of all current medications.  5- Hold the following medication:________________________.   Call [phone] to schedule.   Follow-Up: At [facility], you and your health needs are our priority.  As part of our continuing mission to provide you with exceptional heart care, we have created designated Provider Care Teams.  These Care Teams include your primary Cardiologist (physician) and Advanced Practice Providers (APPs -  Physician Assistants and Nurse Practitioners) who all work together to provide you with the care you need, when you need it.   Your next appointment:   Follow up after testing  Provider:   Bryan Lemma, MD      Marykay Lex, MD, MS Bryan Lemma, M.D.,  M.S. Interventional Cardiologist  [facility]   [address]; [address] [place], [place]  [address] [phone]           Fax [phone]    Thank you for choosing [facility] in [place]!!

## 2022-07-21 NOTE — Patient Instructions (Addendum)
Medication Instructions:  No changes at this time.   *If you need a refill on your cardiac medications before your next appointment, please call your pharmacy*   Lab Work: None  If you have labs (blood work) drawn today and your tests are completely normal, you will receive your results only by: MyChart Message (if you have MyChart) OR A paper copy in the mail If you have any lab test that is abnormal or we need to change your treatment, we will call you to review the results.   Testing/Procedures: Your physician has recommended that you have a cardiopulmonary stress test (CPX). CPX testing is a non-invasive measurement of heart and lung function. It replaces a traditional treadmill stress test. This type of test provides a tremendous amount of information that relates not only to your present condition but also for future outcomes. This test combines measurements of you ventilation, respiratory gas exchange in the lungs, electrocardiogram (EKG), blood pressure and physical response before, during, and following an exercise protocol.   Cardiopulmonary Stress Test (CPX)   Your physician has recommended that you have a cardiopulmonary stress test (CPX). CPX testing is a non-invasive measurement of heart and lung function. It replaces a traditional treadmill stress test. This type of test provides a tremendous amount of information that relates not only to your present condition but also for future outcomes. This test combines measurements of you ventilation, respiratory gas exchange in the lungs, electrocardiogram (EKG), blood pressure and physical response before, during, and following an exercise protocol.  Date : TBD Time : TBD  (arrive 30 min prior to scheduled appt time) Location: [facility]   [address] Main entrance A, Gate Code ( )  Expect to be in the lab for 2 hours.   Instructions 1- Eat a light meal prior to test. No alcohol, caffeine, or  nicotine products within 2 hours of test. (Avoid heavy meal) 2- Avoid significant exertion or exercise 24 hours prior to test. 3- Be prepared to exercise and sweat. Wear loose fitting, short sleeved shirt.  4- Bring a list of all current medications.  5- Hold the following medication:________________________.   Call [phone] to schedule.   Follow-Up: At [facility], you and your health needs are our priority.  As part of our continuing mission to provide you with exceptional heart care, we have created designated Provider Care Teams.  These Care Teams include your primary Cardiologist (physician) and Advanced Practice Providers (APPs -  Physician Assistants and Nurse Practitioners) who all work together to provide you with the care you need, when you need it.   Your next appointment:   Follow up after testing  Provider:   Bryan Lemma, MD

## 2022-07-25 ENCOUNTER — Other Ambulatory Visit: Payer: Self-pay

## 2022-07-25 ENCOUNTER — Ambulatory Visit: Payer: Commercial Managed Care - PPO | Attending: Obstetrics and Gynecology | Admitting: Obstetrics and Gynecology

## 2022-07-25 VITALS — BP 134/71 | HR 77 | Temp 97.8°F | Wt 255.0 lb

## 2022-07-25 DIAGNOSIS — Z6841 Body Mass Index (BMI) 40.0 and over, adult: Secondary | ICD-10-CM

## 2022-07-25 DIAGNOSIS — Z8616 Personal history of COVID-19: Secondary | ICD-10-CM

## 2022-07-25 DIAGNOSIS — O09529 Supervision of elderly multigravida, unspecified trimester: Secondary | ICD-10-CM

## 2022-07-25 DIAGNOSIS — M5137 Other intervertebral disc degeneration, lumbosacral region: Secondary | ICD-10-CM

## 2022-07-25 DIAGNOSIS — Z1589 Genetic susceptibility to other disease: Secondary | ICD-10-CM | POA: Diagnosis not present

## 2022-07-25 DIAGNOSIS — I479 Paroxysmal tachycardia, unspecified: Secondary | ICD-10-CM | POA: Diagnosis not present

## 2022-07-25 DIAGNOSIS — Z3169 Encounter for other general counseling and advice on procreation: Secondary | ICD-10-CM | POA: Diagnosis not present

## 2022-07-25 DIAGNOSIS — G932 Benign intracranial hypertension: Secondary | ICD-10-CM | POA: Diagnosis not present

## 2022-07-25 NOTE — Progress Notes (Signed)
Maternal-Fetal Medicine   Name: Belinda Martin DOB: [birth year] MRN: [id number] Referring Provider: Paulita Cradle  I had the pleasure of seeing [name] today at [facility].  She is G2 P0010 and is here for preconception consultation. Her problems include: -MTHFR homozygous mutation.  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  Homocystine level (March 2019) was within normal range.  She has no personal history of venous thromboembolism. -Advanced maternal age.  Patient is 47 years old and is planning natural conception. -History of COVID infections with most recent infection in 2023.  Patient has lingering symptoms of dyspnea on exertion.  She had 2 COVID-19 vaccinations. -Class III obesity.  BMI 40.25. -Degenerative disc disease.  MRI performed in 2018 showed slight lumbar lordosis and mild retrolisthesis L5 and S1. -Intermittent palpitations.  Patient was using KardiaMobile at home and sending her EKG results to her cardiologist.  Patient reports that she was told that she has intermittent premature atrial contractions.  She drinks 1 soda (caffeinated drink) every morning. Patient will be undergoing cardiopulmonary stress test (CPX) that is a non-invasive assessment of cardiac and pulmonary function. -Idiopathic intracranial hypertension (pseudotumor cerebri).  Patient reports her ophthalmologist ruled out glaucoma and she has passed the peripheral vision test and no evidence of papilledema.  She has never taken acetazolamide.  Her past medical history of migraine has improved.  Patient did not have lumbar puncture.  Review of symptoms: No severe headaches or visual disturbances.  Mild shortness of breath and intermittent palpitations (see above), patient has multiple joint pains (especially back), no abdominal pain no irregular vaginal bleeding.  Past medical history: No history of diabetes or hypertension or any  chronic medical conditions.  Her recent hemoglobin A1c was 5.7%. Past surgical history: Electrocautery ablation of condyloma acuminata (2009), nasal sinus surgery, not tonsillectomy. Allergies: Amoxicillin (hives). Medications: Folic acid 1 mg daily, albuterol as needed, drospineone (Slynd)-oral contraceptive. Social history: Denies tobacco or drug or alcohol use.  She has been married a year and her husband is in good health.  He is Caucasian. Family history: Father has atrial fibrillation and had 2 episodes of venous thromboembolism.  He has MTHFR homozygous mutation.  Mother has hypertension. GYN history: Condyloma acuminatum.  No recent history of abnormal Pap smears.  She had cervical biopsy in the past.  No history of cone biopsy or LEEP.  No history of breast disease.  Patient is on continuous oral contraception does not have menstrual cycles. Obstetric history significant for an early miscarriage 5 years ago (chemical pregnancy).  Our concerns include MTHFR C677T homozygous mutation This is a common mutation present in 10 to 15% of Caucasian population.  It is the most common cause of hyperhomocysteinemia, which is associated with slightly increased risk (weak association) of venous thromboembolism.  Given there is no personal history of venous thromboembolism, I do not recommend prophylactic anticoagulation in pregnancy.  Homocystine level assessed 5 years ago was normal.  Patient is taking folic acid 1 mg, which may be helpful.  I encouraged her to continue taking folic acid in the preconception period to reduce the likelihood of fetal spina bifida and other anomalies.  Cardiopulmonary issues Patient had echocardiography that showed normal structural heart with good left ventricular ejection fraction (60 to 65%).  She was told by her cardiologist she has paroxysmal atrial tachycardia.  I discussed the link between caffeinated drinks and PAC.  I discussed physiological changes and  cardiovascular function  in pregnancy.  The absence of structural anomalies, I expect good pregnancy course and outcome. Patient will be undergoing cardiopulmonary stress test and, if normal, we should not expect any adverse outcomes.  She reports slight increase in respiratory symptoms after COVID-19 infection.  Very Advanced maternal age (AMA) -Very advanced maternal age is the term used in medical literature for women aged 45 years or above. -AMA carries higher risk of maternal and fetal complications. -Significantly, there is an increased risk of fetal chromosomal malformations.   I discussed the options of cell-free fetal DNA screening in the first trimester (after 10 weeks) that has a greater detection rate for trisomies 21, 13, 18 and sex chromosomal anomalies.  I also counseled her on invasive testing including chorionic villous sampling and amniocentesis for definitive results on the fetal karyotype. -Early pregnancy complications include spontaneous miscarriages (at least twice the normal incidence of 15 to 20%) and ectopic pregnancies (increased to three-fold).  She does not have any obvious risk factors for second trimester pregnancy loss.  If patient is unable to conceive, she has an option of IVF-donor egg.  I discussed the possibility of multiple gestations from natural conception or from IVF.  Idiopathic intracranial hypertension IIH) Also known as pseudotumor cerebri it is characterized by increased CSF pressure (with normal composition) and absence of any intracranial lesions on neuroimaging.  Patient does not have headache or visual disturbances now.  Previous investigations have ruled out peripheral vision loss or papilledema.  Patient was not taking acetazolamide. I briefly counseled her on the safety of acetazolamide in pregnancy (if prescribed).  IIH is associated with good pregnancy outcomes.  Degenerative disc disease Patient does not have disc prolapse or any neurological  symptoms.  She is taking NSAIDs now that are contraindicated in pregnancy.  Will manage her pregnancy with compatible medications including acetaminophen.  I reassured the patient that she does not have any risk factors that would contraindicate pregnancy. If cardiopulmonary stress test is normal, she can attempt pregnancy. Patient understands that she may have difficulty in conceiving and may need fertility treatment.  Thank you for consultation.  If you have any questions or concerns, please contact me the [facility].  Consultation including face-to-face counseling (more than 50% of time spent) is 45 minutes.

## 2022-07-31 ENCOUNTER — Encounter: Payer: Self-pay | Admitting: Cardiology

## 2022-07-31 NOTE — Assessment & Plan Note (Signed)
Not really bothering her as much.  I think avoiding triggers and staying adequately hydrated is important. She is not interested in using a monitor but I think a Kardia-Mobile portable monitor (if not smart watch) would be helpful.

## 2022-07-31 NOTE — Assessment & Plan Note (Signed)
I am sure her weight is playing a role in her dyspnea on exertion. Less active now admitted for is more sedentary/deconditioned owing to her dyspnea as well. Stressed importance of weight loss.

## 2022-07-31 NOTE — Assessment & Plan Note (Signed)
Echocardiogram was relatively normal.  Would like to exclude cardiac versus pulmonary etiology.  Depending on results could go further.  Plan: CPX (cardiopulmonary stress test)   Shared Decision Making/Informed Consent The risks [chest pain, shortness of breath, cardiac arrhythmias, dizziness, blood pressure fluctuations, myocardial infarction, stroke/transient ischemic attack, and life-threatening complications (estimated to be 1 in 10,000)], benefits (risk stratification, diagnosing coronary artery disease, treatment guidance) and alternatives of an exercise tolerance test were discussed in detail with Belinda Martin and she agrees to proceed.

## 2022-08-09 DIAGNOSIS — M791 Myalgia, unspecified site: Secondary | ICD-10-CM | POA: Diagnosis not present

## 2022-08-09 DIAGNOSIS — M5136 Other intervertebral disc degeneration, lumbar region: Secondary | ICD-10-CM | POA: Diagnosis not present

## 2022-08-18 ENCOUNTER — Ambulatory Visit: Payer: Commercial Managed Care - PPO | Admitting: Dermatology

## 2022-08-18 VITALS — BP 134/71

## 2022-08-18 DIAGNOSIS — L304 Erythema intertrigo: Secondary | ICD-10-CM | POA: Diagnosis not present

## 2022-08-18 DIAGNOSIS — L811 Chloasma: Secondary | ICD-10-CM

## 2022-08-18 MED ORDER — AZELAIC ACID 15 % EX GEL
CUTANEOUS | 3 refills | Status: DC
Start: 2022-08-18 — End: 2023-05-15

## 2022-08-18 NOTE — Patient Instructions (Addendum)
Recommend tinted mineral sunscreen with zinc daily.   Consider Farm[employer] TXA in place of The [employer]key tranexamic acid.   Continue azelaic acid twice daily.  Continue The Perfect A nightly.   Continue prescription Skin Medicinals Hydroquinone 8%, Tretinoin 0.1%, Kojic acid 1%, Niacinamide 4%, Fluocinolone 0.025% cream, a pea sized amount nightly to dark spots on face for up to 2 months. This cannot be used more than 2 months due to risk of skin atrophy (thinning) and exogenous ochronosis (permanent dark spots).   Let us know if you would like refills on Skin Medicinals Hydroquinone 12% without tretinoin to use for 1 month after using the Skin Medicinals with tretinoin for 2 months.   Recommend taking Heliocare sun protection supplement daily in sunny weather for additional sun protection. For maximum protection on the sunniest days, you can take up to 2 capsules of regular Heliocare OR take 1 capsule of Heliocare Ultra. For prolonged exposure (such as a full day in the sun), you can repeat your dose of the supplement 4 hours after your first dose. Heliocare can be purchased at [employer], at some [facility] or at [email or web address].    Due to recent changes in healthcare laws, you may see results of your pathology and/or laboratory studies on MyChart before the doctors have had a chance to review them. We understand that in some cases there may be results that are confusing or concerning to you. Please understand that not all results are received at the same time and often the doctors may need to interpret multiple results in order to provide you with the best plan of care or course of treatment. Therefore, we ask that you please give [place] 2 business days to thoroughly review all your results before contacting the office for clarification. Should we see a critical lab result, you will be contacted sooner.   If You Need Anything After Your Visit  If you have any questions or concerns for your  doctor, please call our main line at [phone] and press option 4 to reach your doctor's medical assistant. If no one answers, please leave a voicemail as directed and we will return your call as soon as possible. Messages left after 4 pm will be answered the following business day.   You may also send [place] a message via MyChart. We typically respond to MyChart messages within 1-2 business days.  For prescription refills, please ask your pharmacy to contact our office. Our fax number is [phone].  If you have an urgent issue when the clinic is closed that cannot wait until the next business day, you can page your doctor at the number below.    Please note that while we do our best to be available for urgent issues outside of office hours, we are not available 24/7.   If you have an urgent issue and are unable to reach [place], you may choose to seek medical care at your doctor's office, retail clinic, urgent care center, or emergency room.  If you have a medical emergency, please immediately call 911 or go to the emergency department.  Pager Numbers  - Dr. Gwen Pounds: [phone]  - Dr. Neale Burly: [phone]  - Dr. Roseanne Reno: [phone]  In the event of inclement weather, please call our main line at [phone] for an update on the status of any delays or closures.  Dermatology Medication Tips: Please keep the boxes that topical medications come in in order to help keep track of the instructions about where and how  to use these. Pharmacies typically print the medication instructions only on the boxes and not directly on the medication tubes.   If your medication is too expensive, please contact our office at [phone] option 4 or send [place] a message through MyChart.   We are unable to tell what your co-pay for medications will be in advance as this is different depending on your insurance coverage. However, we may be able to find a substitute medication at lower cost or fill out paperwork  to get insurance to cover a needed medication.   If a prior authorization is required to get your medication covered by your insurance company, please allow [place] 1-2 business days to complete this process.  Drug prices often vary depending on where the prescription is filled and some pharmacies may offer cheaper prices.  The website www.goodrx.com contains coupons for medications through different pharmacies. The prices here do not account for what the cost may be with help from insurance (it may be cheaper with your insurance), but the website can give you the price if you did not use any insurance.  - You can print the associated coupon and take it with your prescription to the pharmacy.  - You may also stop by our office during regular business hours and pick up a GoodRx coupon card.  - If you need your prescription sent electronically to a different pharmacy, notify our office through [facility] or by phone at [phone] option 4.

## 2022-08-18 NOTE — Progress Notes (Signed)
Follow-Up Visit   Subjective  Belinda Martin is a 47 y.o. female who presents for the following: intertrigo follow up. Patient used ketoconazole 2% cream and HC 2.5% cream and is continuing to use prn. She has had improvement and is using Zeasorb powder and blow drying.   Melasma follow up. She restarted the Skinmedicinals hydroquinone about 1 week ago.   The following portions of the chart were reviewed this encounter and updated as appropriate: medications, allergies, medical history  Review of Systems:  No other skin or systemic complaints except as noted in HPI or Assessment and Plan.  Objective  Well appearing patient in no apparent distress; mood and affect are within normal limits.   A focused examination was performed of the following areas: face  Relevant exam findings are noted in the Assessment and Plan.   Assessment & Plan   MELASMA Exam: reticulated hyperpigmented patches at face  Chronic and persistent condition with duration or expected duration over one year. Condition is symptomatic/ bothersome to patient. Not currently at goal.  Melasma is a chronic; persistent condition of hyperpigmented patches generally on the face, worse in summer due to higher UV exposure.    Heredity; thyroid disease; sun exposure; pregnancy; birth control pills; epilepsy medication and darker skin may predispose to Melasma.   Recommendations include: - Sun avoidance and daily broad spectrum (UVA/UVB) tinted mineral sunscreen SPF 30+, with Zinc or Titanium Dioxide. - Rx topical bleaching creams (i.e. hydroquinone) is a common treatment but should not be used long term.  Hydroquinones may be mixed with retinoids; vitamin C; steroids; Kojic Acid. - Alastin A-luminate, retinoids, vitamin C, topical tranexamic acid, glycolic acid and kojic acid can be used for brightening while on break from hydroquinone - Rx Azelaic Acid is also a treatment option that is safe for pregnancy (Category B). -  OTC Heliocare can be helpful in control and prevention. - Defer Oral Rx with Tranexamic Acid 250 mg - 650 mg po daily can be used for moderate to severe cases especially during summer (contraindications include pregnancy; lactation; hx of PE; hx of DVT; clotting disorder; heart disease; anticoagulant use and upcoming long trips)   - Chemical peels (would need multiple for best result).  - Lasers and  Microdermabrasion may also be helpful adjunct treatments.  Treatment Plan: Continue prescription Skin Medicinals Hydroquinone 8%, Tretinoin 0.1%, Kojic acid 1%, Niacinamide 4%, Fluocinolone 0.025% cream, a pea sized amount nightly to dark spots on face for up to 2 months. This cannot be used more than 2 months due to risk of skin atrophy (thinning) and exogenous ochronosis (permanent dark spots). The patient was advised this is not covered by insurance. They will receive an email to check out and the medication will be mailed to their home.  Continue azelaic acid twice daily Continue The Perfect A nightly.   Patient will let us know if she would like refills on Skin Medicinals Hydroquinone 12% without tretinoin to use for 1 month after using the Skin Medicinals with tretinoin for 2 months.   INTERTRIGO Exam Erythematous macerated patches  Chronic and persistent condition with duration or expected duration over one year. Condition is symptomatic/ bothersome to patient. Not currently at goal.  Intertrigo is a chronic recurrent rash that occurs in skin fold areas that may be associated with friction; heat; moisture; yeast; fungus; and bacteria.  It is exacerbated by increased movement / activity; sweating; and higher atmospheric temperature.  Treatment Plan Start ketoconazole cream twice a day  as needed for rash.  Continue hydrocortisone 2.5% cream twice a day for up to 2 weeks as needed for rash.   Topical steroids (such as triamcinolone, fluocinolone, fluocinonide, mometasone, clobetasol,  halobetasol, betamethasone, hydrocortisone) can cause thinning and lightening of the skin if they are used for too long in the same area. Your physician has selected the right strength medicine for your problem and area affected on the body. Please use your medication only as directed by your physician to prevent side effects.   Start Zeasorb AF powder or other OTC antifungal powder to the area daily to prevent rash recurrence. Other options to help keep the area dry include blow drying the area after bathing or using antiperspirant products such as Duradry to help keep the area dry.   Return in about 1 year (around 08/18/2023) for Dr. Roseanne Reno .  Anise Salvo, RMA, am acting as scribe for Darden Dates, MD .   Documentation: I have reviewed the above documentation for accuracy and completeness, and I agree with the above.  Darden Dates, MD

## 2022-09-05 ENCOUNTER — Ambulatory Visit: Payer: Commercial Managed Care - PPO | Admitting: Obstetrics and Gynecology

## 2022-09-07 ENCOUNTER — Ambulatory Visit: Payer: Self-pay | Admitting: Dermatology

## 2022-09-11 NOTE — Progress Notes (Unsigned)
PCP:  Belinda Brooks, MD   No chief complaint on file.    HPI:      [name] is a 47 y.o. G1P0010 whose LMP was No LMP recorded. (Menstrual status: Oral contraceptives)., presents today for her annual examination.  Her menses are absent with Slynd except for occas spotting for 1 day, no dysmen. Was having heavier and longer bleeding with camila in past.   Sex activity: single partner, contraception - slynd.  Hx of visual changes with headaches in past. Did depo in HS with wt gain and worsening migraines. Hx of infertlility in past. Referred to MFM for MTHFR mutation,  Last Pap: 06/10/19 Results were: no abnormalities /neg HPV DNA   Last mammogram: 01/18/22 Results were normal, repeat in 12 months There is no FH of breast cancer. There is no FH of ovarian cancer. The patient does self-breast exams. Continues to have circular wet spots in bras occas, doesn't notice nipple d/c otherwise. Denies any red/brown/green coloration. Had normal TSH 9/22  Tobacco use: The patient denies current or previous tobacco use. Alcohol use: none No drug use.  Exercise: very active  She does get adequate calcium and Vitamin D in her diet. 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  EF 60 to 65% no RWMA. " Normal diastolic parameters".  Normal RV size function and pressures.  Normal valves.  Normal RAP.    Family History  Problem Relation Age of Onset   Arrhythmia Mother    Heart murmur Mother    Endometriosis Mother    Fibromyalgia Mother    Irritable bowel syndrome Mother    Deep vein thrombosis Father        MTHFR homozygous   Atrial fibrillation Father        Relatively recent diagnosis.   Hypertension Father    Peripheral Artery Disease Father        Carotid artery stenosis-CEA   Crohn's disease Maternal Grandmother    Diabetes Maternal Grandmother    Heart failure Paternal Grandfather        CHF;   Stomach cancer Paternal Grandfather    Heart murmur Paternal Uncle    Coronary artery disease Neg Hx        premature CAD   Breast cancer Neg Hx     Social History   Socioeconomic History   Marital status: Married    Spouse name: Arlys John   Number of children:  0   Years of education: Not on file   Highest education level: Master's degree (e.g., MA, MS, MEng, MEd, MSW, MBA)  Occupational History   Occupation: [employer]    Comment: Interior and spatial designer [employer]  Tobacco Use   Smoking status: Never   Smokeless tobacco: Never  Vaping Use   Vaping Use: Never used  Substance and Sexual Activity   Alcohol use: Not Currently    Comment: rare   Drug use: No   Sexual activity: Yes    Partners: Male    Birth control/protection: Pill  Other Topics Concern   Not on file  Social History Narrative   Remarried; lives in [place]      She has lost about 90 pounds in the last year-dietary modification and try to increase exercise level.   Her new husband has encouraged her to increase her exercise => she walks most days.  They go hiking on the weekends.     Social Determinants of Health   Financial  Resource Strain: Not on file  Food Insecurity: Not on file  Transportation Needs: Not on file  Physical Activity: Inactive (05/29/2017)   Exercise Vital Sign    Days of Exercise per Week: 0 days    Minutes of Exercise per Session: 0 min  Stress: Stress Concern Present (05/29/2017)   [employer] of Occupational Health - Occupational Stress Questionnaire    Feeling of Stress : To some extent  Social Connections: Not on file  Intimate Partner Violence: Not on file     Current Outpatient Medications:    albuterol (VENTOLIN HFA) 108 (90 Base) MCG/ACT inhaler, Inhale 2 puffs into the lungs every 6 (six) hours as needed for wheezing or shortness of breath., Disp: 8 g, Rfl: 1   ascorbic acid (VITAMIN C) 500 MG tablet, Take 500 mg by mouth daily., Disp: , Rfl:    Azelaic Acid 15 % gel, After skin is thoroughly washed and patted dry, gently but thoroughly massage a thin film of azelaic acid cream into the affected area twice daily, in the morning and evening., Disp: 50 g, Rfl: 3   Biotin 10 MG TABS, Take by mouth daily., Disp: , Rfl:    cholecalciferol (VITAMIN D3) 25 MCG (1000 UNIT) tablet, Take 1,000 Units by mouth daily., Disp: , Rfl:    clotrimazole-betamethasone (LOTRISONE) cream, Apply externally BID prn sx up to 2 wks, Disp: 15 g, Rfl: 0   Drospirenone (SLYND) 4 MG TABS, Take 1 tablet by mouth daily., Disp: 84 tablet, Rfl: 3   fexofenadine (ALLEGRA) 180 MG tablet, Take 180 mg by mouth daily., Disp: , Rfl:    folic acid (FOLVITE) 1 MG tablet, Take 3 mg by mouth daily., Disp: , Rfl:    Glucosamine 500 MG CAPS, Take 1,000 mg by mouth daily., Disp: , Rfl:    hydrocortisone 2.5 % cream, Apply topically 2 (two) times daily as needed (Rash). To area inner thighs and folds for up to 2 weeks. Avoid applying to face, groin, and axilla. Use as directed. Risk of skin atrophy with long-term use reviewed., Disp: 30 g, Rfl: 1   Ibuprofen-Famotidine (DUEXIS) 800-26.6 MG TABS, Take 1 tablet by mouth as  needed., Disp: , Rfl:    Multiple Vitamins-Calcium (ONE-A-DAY WOMENS FORMULA PO), Take 1 tablet by mouth daily., Disp: , Rfl:    Omega-3 Fatty Acids (FISH OIL) 1000 MG CAPS, Take 2 capsules by mouth daily., Disp: , Rfl:    tiZANidine (ZANAFLEX) 4 MG  tablet, Take 1 tablet (4 mg total) by mouth every 6 (six) hours as needed for muscle spasms., Disp: 30 tablet, Rfl: 1   TRIAMCINOLONE ACETONIDE, TOP, (TRIANEX) 0.05 % OINT, Apply twice daily to hands for up to 2 weeks then weekends only. Avoid applying to face, groin, and axilla. Use as directed. Risk of skin atrophy with long-term use reviewed., Disp: 110 g, Rfl: 1   vitamin B-12 (CYANOCOBALAMIN) 500 MCG tablet, Take 1,000 mcg by mouth daily., Disp: , Rfl:      ROS:  Review of Systems  Constitutional:  Negative for fatigue, fever and unexpected weight change.  Respiratory:  Negative for cough, shortness of breath and wheezing.   Cardiovascular:  Negative for chest pain, palpitations and leg swelling.  Gastrointestinal:  Negative for blood in stool, constipation, diarrhea, nausea and vomiting.  Endocrine: Negative for cold intolerance, heat intolerance and polyuria.  Genitourinary:  Negative for dyspareunia, dysuria, flank pain, frequency, genital sores, hematuria, menstrual problem, pelvic pain, urgency, vaginal bleeding, vaginal discharge and vaginal pain.  Musculoskeletal:  Positive for back pain. Negative for joint swelling and myalgias.  Skin:  Negative for rash.  Neurological:  Negative for dizziness, syncope, light-headedness, numbness and headaches.  Hematological:  Negative for adenopathy.  Psychiatric/Behavioral:  Negative for agitation, confusion, sleep disturbance and suicidal ideas. The patient is not nervous/anxious.   BREAST: No symptoms   Objective: There were no vitals taken for this visit.   Physical Exam Constitutional:      Appearance: She is well-developed.  Genitourinary:     Vulva normal.     Right Labia: No  rash, tenderness or lesions.    Left Labia: No tenderness, lesions or rash.    No vaginal discharge, erythema or tenderness.      Right Adnexa: not tender and no mass present.    Left Adnexa: not tender and no mass present.    No cervical friability or polyp.     Uterus is not enlarged or tender.  Breasts:    Right: No mass, nipple discharge, skin change or tenderness.     Left: No mass, nipple discharge, skin change or tenderness.  Neck:     Thyroid: No thyromegaly.  Cardiovascular:     Rate and Rhythm: Normal rate and regular rhythm.     Heart sounds: Normal heart sounds. No murmur heard. Pulmonary:     Effort: Pulmonary effort is normal.     Breath sounds: Normal breath sounds.  Abdominal:     Palpations: Abdomen is soft.     Tenderness: There is no abdominal tenderness. There is no guarding or rebound.  Musculoskeletal:        General: Normal range of motion.     Cervical back: Normal range of motion.  Lymphadenopathy:     Cervical: No cervical adenopathy.  Neurological:     General: No focal deficit present.     Mental Status: She is alert and oriented to person, place, and time.     Cranial Nerves: No cranial nerve deficit.  Skin:    General: Skin is warm and dry.  Psychiatric:        Mood and Affect: Mood normal.        Behavior: Behavior normal.        Thought Content: Thought content normal.        Judgment: Judgment normal.  Vitals reviewed.     Assessment/Plan: Encounter for annual routine gynecological examination  Encounter for surveillance of contraceptive pills - Plan: Drospirenone (  SLYND) 4 MG TABS; Rx RF eRxd. Will do prior auth prn.  Encounter for screening mammogram for malignant neoplasm of breast - Plan: MM DIAG BREAST TOMO BILATERAL; pt to schedule mammo  Abnormal mammogram of left breast--f/u due 9/23  Galactorrhea - Plan: Prolactinl; normal TSH, check prolactin. If WNL, reassurance.   No orders of the defined types were placed in this  encounter.    GYN counsel breast self exam, mammography screening, family planning choices, adequate intake of calcium and vitamin D, diet and exercise     F/U  No follow-ups on file.  Rhodie Cienfuegos B. Reanna Scoggin, PA-C 09/11/2022 6:39 PM

## 2022-09-12 ENCOUNTER — Ambulatory Visit (INDEPENDENT_AMBULATORY_CARE_PROVIDER_SITE_OTHER): Payer: Commercial Managed Care - PPO | Admitting: Obstetrics and Gynecology

## 2022-09-12 ENCOUNTER — Encounter: Payer: Self-pay | Admitting: Obstetrics and Gynecology

## 2022-09-12 ENCOUNTER — Other Ambulatory Visit (HOSPITAL_COMMUNITY)
Admission: RE | Admit: 2022-09-12 | Discharge: 2022-09-12 | Disposition: A | Discharge status: Home / Self Care | Payer: Commercial Managed Care - PPO | Source: Ambulatory Visit | Attending: Obstetrics and Gynecology | Admitting: Obstetrics and Gynecology

## 2022-09-12 VITALS — BP 116/75 | HR 75 | Ht 66.0 in | Wt 259.4 lb

## 2022-09-12 DIAGNOSIS — Z124 Encounter for screening for malignant neoplasm of cervix: Secondary | ICD-10-CM | POA: Diagnosis not present

## 2022-09-12 DIAGNOSIS — Z01419 Encounter for gynecological examination (general) (routine) without abnormal findings: Secondary | ICD-10-CM | POA: Diagnosis not present

## 2022-09-12 DIAGNOSIS — N3281 Overactive bladder: Secondary | ICD-10-CM

## 2022-09-12 DIAGNOSIS — Z1231 Encounter for screening mammogram for malignant neoplasm of breast: Secondary | ICD-10-CM

## 2022-09-12 DIAGNOSIS — Z3041 Encounter for surveillance of contraceptive pills: Secondary | ICD-10-CM

## 2022-09-12 DIAGNOSIS — Z1151 Encounter for screening for human papillomavirus (HPV): Secondary | ICD-10-CM | POA: Diagnosis not present

## 2022-09-12 DIAGNOSIS — Z3169 Encounter for other general counseling and advice on procreation: Secondary | ICD-10-CM

## 2022-09-12 MED ORDER — SLYND 4 MG PO TABS
1.0000 | ORAL_TABLET | Freq: Every day | ORAL | 3 refills | Status: DC
Start: 2022-09-12 — End: 2023-08-29

## 2022-09-12 NOTE — Patient Instructions (Signed)
I value your feedback and you entrusting us with your care. If you get a [facility] patient survey, I would appreciate you taking the time to let us know about your experience today. Thank you! ? ? ?

## 2022-09-14 LAB — CYTOLOGY - PAP
Adequacy: ABSENT
Comment: NEGATIVE
Diagnosis: NEGATIVE
High risk HPV: NEGATIVE

## 2022-09-15 ENCOUNTER — Encounter: Payer: Self-pay | Admitting: Obstetrics and Gynecology

## 2022-09-19 ENCOUNTER — Encounter (HOSPITAL_COMMUNITY): Payer: Commercial Managed Care - PPO

## 2022-10-03 IMAGING — DX DG HAND COMPLETE 3+V*R*
3 series · 3 of 3 positions shown · non-contrast
Comparison: None.

CLINICAL DATA: Dog bite to right hand

EXAM:
RIGHT HAND - COMPLETE 3+ VIEW

[hand ap]
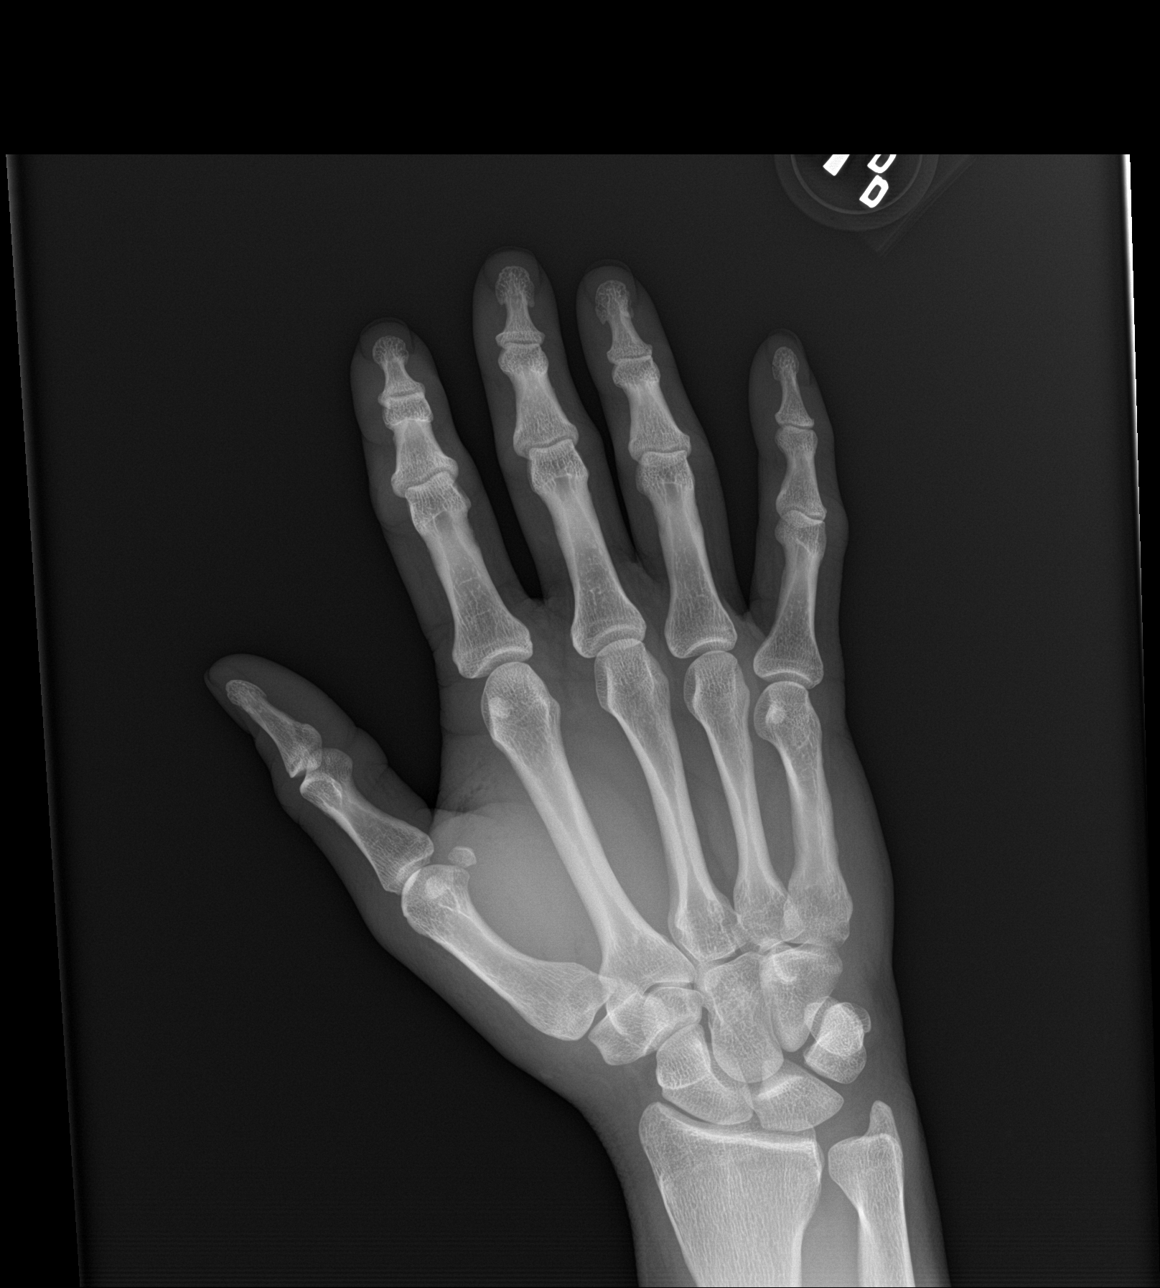

[hand obl]
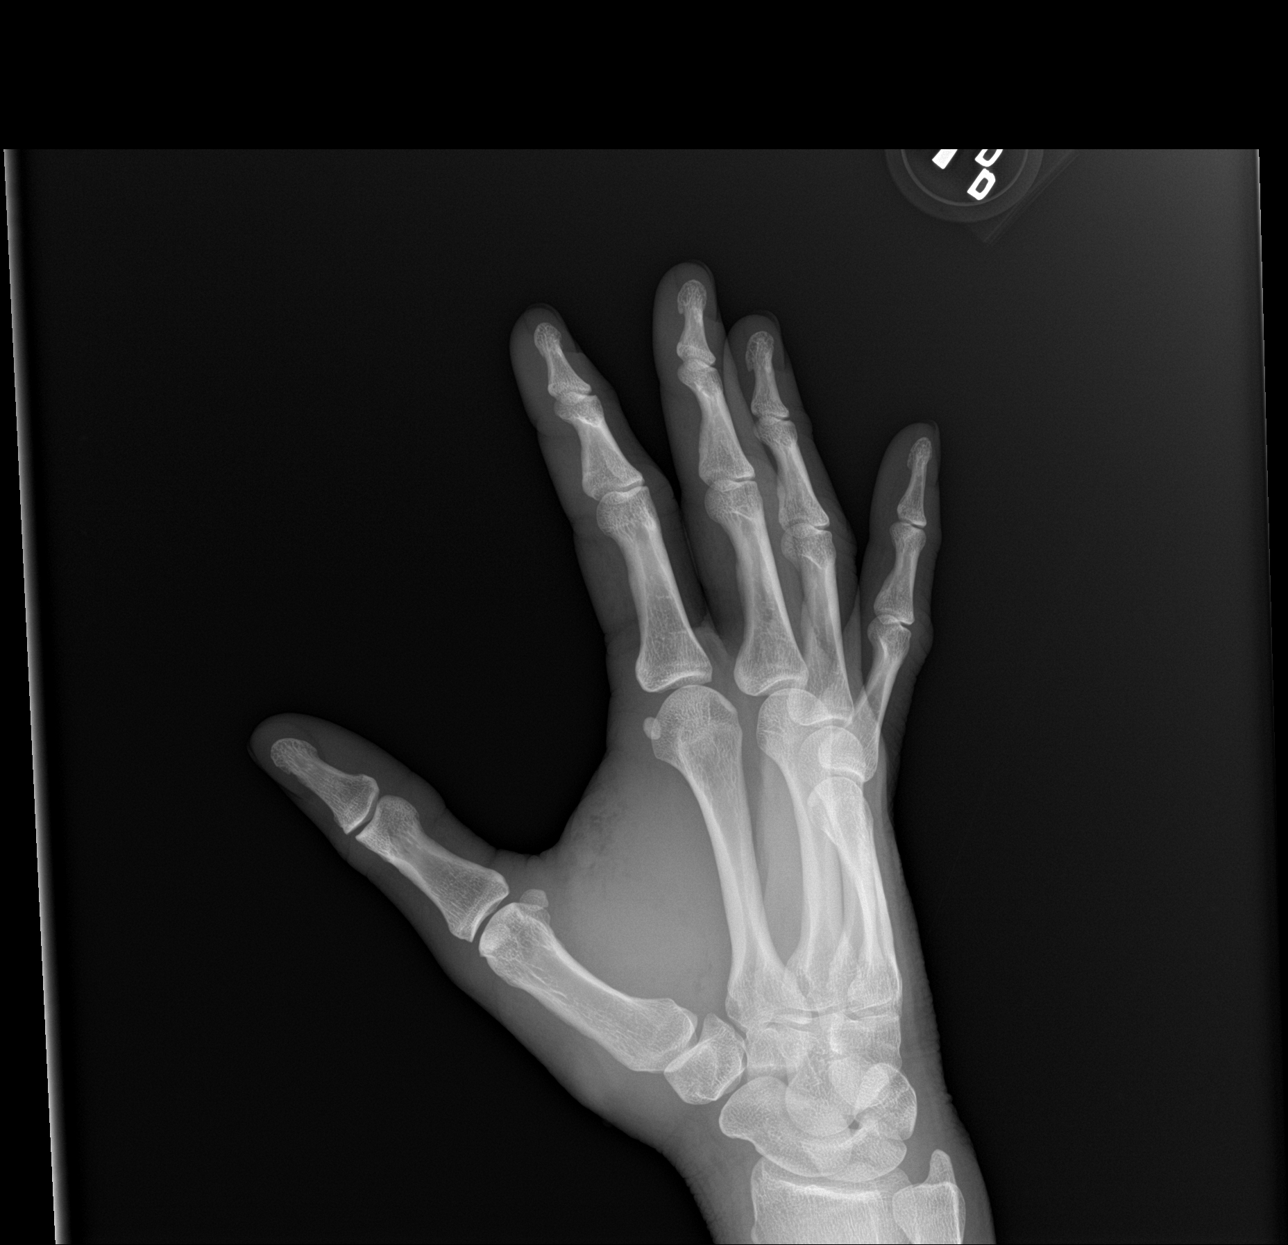

[hand lat]
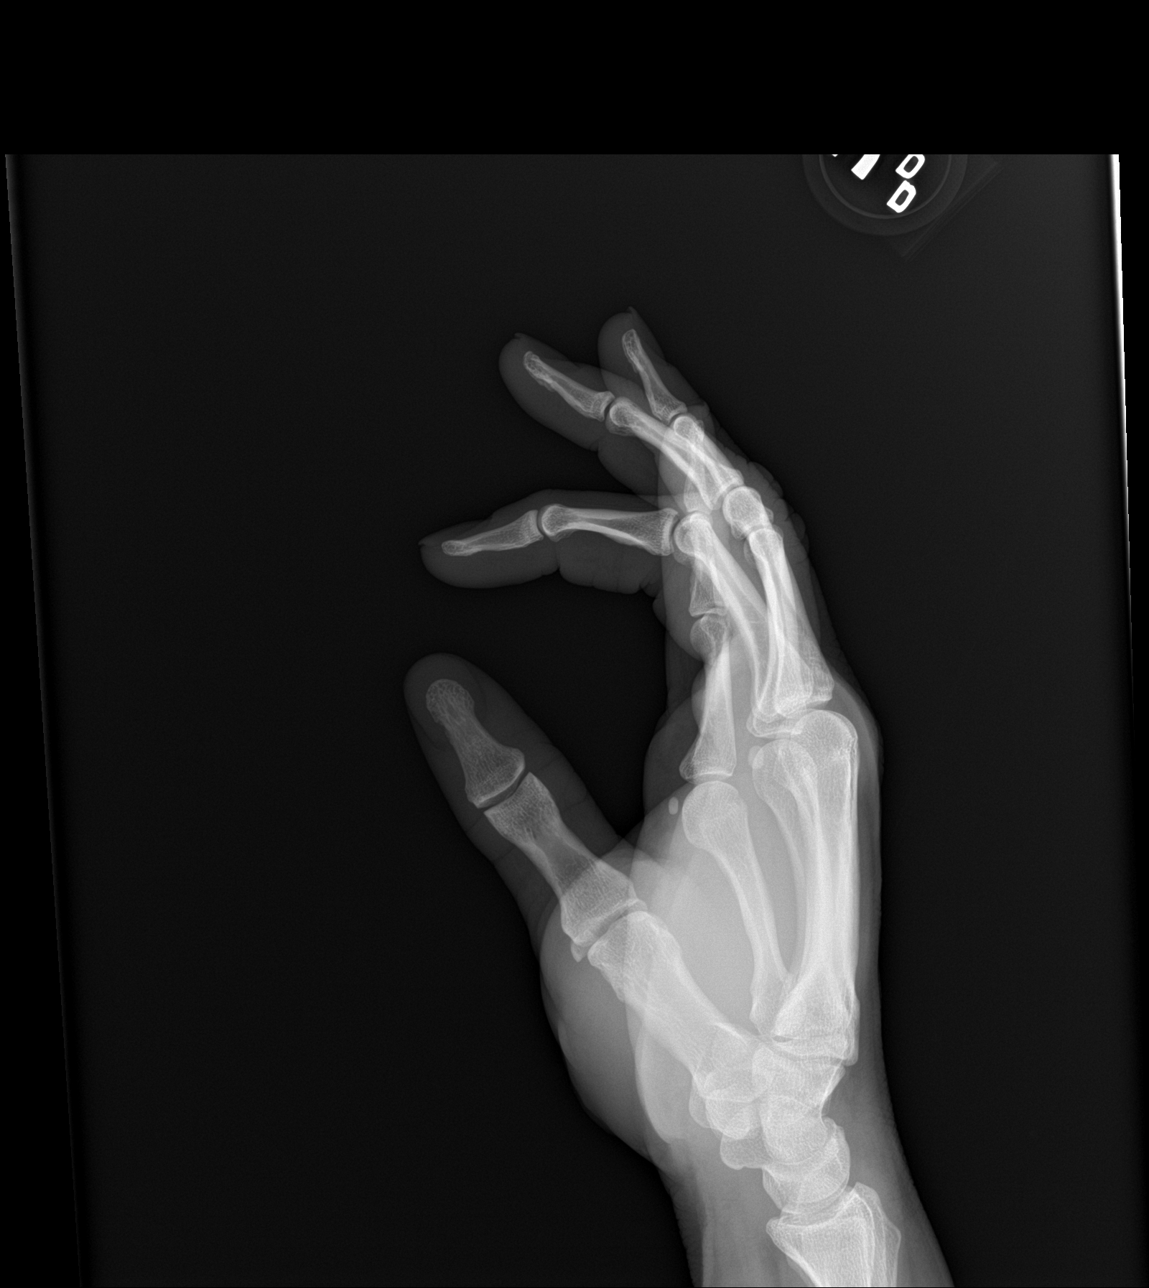

[3 of 3 positions shown; findings below may reference images not displayed]

FINDINGS: Soft tissue swelling in the hypothenar eminence. No radiopaque
foreign bodies. Small amount of soft tissue gas. No acute bony
abnormality. Specifically, no fracture, subluxation, or dislocation.
IMPRESSION: No acute bony abnormality or radiopaque foreign body.

## 2022-10-05 ENCOUNTER — Encounter (HOSPITAL_COMMUNITY): Payer: Commercial Managed Care - PPO

## 2022-10-10 ENCOUNTER — Ambulatory Visit (HOSPITAL_COMMUNITY): Payer: Commercial Managed Care - PPO | Attending: Cardiology

## 2022-10-10 DIAGNOSIS — R0609 Other forms of dyspnea: Secondary | ICD-10-CM

## 2022-10-10 DIAGNOSIS — R0600 Dyspnea, unspecified: Secondary | ICD-10-CM | POA: Diagnosis not present

## 2022-10-17 ENCOUNTER — Telehealth: Payer: Self-pay | Admitting: Cardiology

## 2022-10-17 NOTE — Telephone Encounter (Signed)
LVM advising patient to return call for test results.    Marykay Lex, MD 10/16/2022  1:45 PM EDT     Cardiopulmonary Stress Test Results:   Exercise testing demonstrates normal functional capacity.  No clear cardiopulmonary limitations, however there is a significant hypertensive response to exercise suggesting diastolic dysfunction (abnormal relaxing of the left ventricle related to high blood pressure).  However majority of limitation is probably related to body habitus.   This was suggested most of the shortness of breath is related to being overweight and deconditioned.  There is some component of high blood pressure response to exercise which is made worse by deconditioning.  Recommendations are to treat blood pressure and weight loss.   Bryan Lemma, MD

## 2022-10-17 NOTE — Telephone Encounter (Signed)
Results given to patient.  She ask what recommendations for the BP and weight loss are suggested. She will keep a log of BP.  She states she stays in the 112/90 range except with exertion . Advised any time she does anything exerting and feels, SOB, lightheaded, Headache or other symptoms, to check then as well.  Will wait for recommendations from provider

## 2022-10-17 NOTE — Telephone Encounter (Signed)
Patient returned RN's call and stated she will be with patient between 12:30 pm and 2:30 pm

## 2022-10-17 NOTE — Telephone Encounter (Signed)
Patient is returning call in regard to results.

## 2022-10-17 NOTE — Telephone Encounter (Signed)
Dr. Herbie Baltimore,   This patient has no follow up scheduled in our clinic. Do you just want her to have an appointment to come in or would you like to recommend she start any new meds by phone.

## 2022-10-18 ENCOUNTER — Telehealth: Payer: Self-pay | Admitting: Cardiology

## 2022-10-18 NOTE — Telephone Encounter (Signed)
  Patient Consent for Virtual Visit        Belinda Martin has provided verbal consent on 10/18/2022 for a virtual visit (video or telephone).   CONSENT FOR VIRTUAL VISIT FOR:  Belinda Martin  By participating in this virtual visit I agree to the following:  I hereby voluntarily request, consent and authorize [facility] and its employed or contracted physicians, physician assistants, nurse practitioners or other licensed health care professionals (the Practitioner), to provide me with telemedicine health care services (the "Services") as deemed necessary by the treating Practitioner. I acknowledge and consent to receive the Services by the Practitioner via telemedicine. I understand that the telemedicine visit will involve communicating with the Practitioner through live audiovisual communication technology and the disclosure of certain medical information by electronic transmission. I acknowledge that I have been given the opportunity to request an in-person assessment or other available alternative prior to the telemedicine visit and am voluntarily participating in the telemedicine visit.  I understand that I have the right to withhold or withdraw my consent to the use of telemedicine in the course of my care at any time, without affecting my right to future care or treatment, and that the Practitioner or I may terminate the telemedicine visit at any time. I understand that I have the right to inspect all information obtained and/or recorded in the course of the telemedicine visit and may receive copies of available information for a reasonable fee.  I understand that some of the potential risks of receiving the Services via telemedicine include:  Delay or interruption in medical evaluation due to technological equipment failure or disruption; Information transmitted may not be sufficient (e.g. poor resolution of images) to allow for appropriate medical decision making by the Practitioner;  and/or  In rare instances, security protocols could fail, causing a breach of personal health information.  Furthermore, I acknowledge that it is my responsibility to provide information about my medical history, conditions and care that is complete and accurate to the best of my ability. I acknowledge that Practitioner's advice, recommendations, and/or decision may be based on factors not within their control, such as incomplete or inaccurate data provided by me or distortions of diagnostic images or specimens that may result from electronic transmissions. I understand that the practice of medicine is not an exact science and that Practitioner makes no warranties or guarantees regarding treatment outcomes. I acknowledge that a copy of this consent can be made available to me via my patient portal [facility] MyChart), or I can request a printed copy by calling the office of  [facility].    I understand that my insurance will be billed for this visit.   I have read or had this consent read to me. I understand the contents of this consent, which adequately explains the benefits and risks of the Services being provided via telemedicine.  I have been provided ample opportunity to ask questions regarding this consent and the Services and have had my questions answered to my satisfaction. I give my informed consent for the services to be provided through the use of telemedicine in my medical care

## 2022-10-18 NOTE — Telephone Encounter (Signed)
I will be in [place] on the 18th.  If I can see her in person, may want to do a virtual visit reckon talk about the results and discuss Rx options going forward.

## 2022-10-19 NOTE — Telephone Encounter (Signed)
The patient has a virtual visit scheduled on 10/20/22 with Dr. Herbie Baltimore at 8:00 am.

## 2022-10-19 NOTE — Progress Notes (Deleted)
  Cardiology Office Note:  .   Date:  10/19/2022  ID:  Belinda Martin, DOB [birth year], MRN [id number] PCP: Donita Brooks, MD  [facility] Providers Cardiologist:  Bryan Lemma, MD { Click to update primary MD,subspecialty MD or APP then REFRESH:1}    No chief complaint on file.   History of Present Illness: [name]     Belinda Martin is a *** 47 y.o. female *** with a PMH notable for *** who presents here for *** at the request of Pickard, Priscille Heidelberg, MD.  Belinda Martin was last seen on ***     Subjective   INTERVAL HISTORY   ROS:  Cardiovascular ROS: {roscv:310661} Review of Systems - {ros master:310782}     Objective   Studies Reviewed: [name]        ECHO: *** CATH: *** MONITOR: *** CT: ***  Risk Assessment/Calculations:   {Does this patient have ATRIAL FIBRILLATION?[phone]} No BP recorded.  {Refresh Note OR Click here to enter BP  :1}***         Physical Exam:   VS:  There were no vitals taken for this visit.   Wt Readings from Last 3 Encounters:  09/12/22 259 lb 6.4 oz (117.7 kg)  07/25/22 255 lb (115.7 kg)  07/21/22 257 lb (116.6 kg)    GEN: Well nourished, well developed in no acute distress; *** NECK: No JVD; No carotid bruits CARDIAC: Normal S1, S2; RRR, no murmurs, rubs, gallops RESPIRATORY:  Clear to auscultation without rales, wheezing or rhonchi ; nonlabored, good air movement. ABDOMEN: Soft, non-tender, non-distended EXTREMITIES:  No edema; No deformity      ASSESSMENT AND PLAN: .    Problem List Items Addressed This Visit   None       {Are you ordering a CV Procedure (e.g. stress test, cath, DCCV, TEE, etc)?   Press F2        :[id number]}   Dispo: No follow-ups on file.  Total time spent: *** min spent with patient + *** min spent charting = *** min  Signed, Marykay Lex, MD, MS Bryan Lemma, M.D., M.S. Interventional Cardiologist  [facility]  Pager # [phone] Phone # [phone] [address] [place], [place] [address]

## 2022-10-19 NOTE — Progress Notes (Signed)
Virtual Visit via Video Note   Because of Belinda Martin's co-morbid illnesses, she is at least at moderate risk for complications without adequate follow up.  This format is felt to be most appropriate for this patient at this time.  All issues noted in this document were discussed and addressed.  A limited physical exam was performed with this format.  Please refer to the patient's chart for her consent to telehealth for [facility].  Video Connection Lost Video connection was lost at > 50% of the duration of this visit, at which time the remainder of the visit was completed via audio only.   - used telephone for audio with computer video for initial visual assessment.   Patient has given verbal permission to conduct this visit via virtual appointment and to bill insurance 10/21/2022 8:40 AM     Evaluation Performed:  Follow-up visit  Date:  10/21/2022   ID:  Belinda Martin, DOB Jan 21, 1976, MRN [id number]  Patient Location: Other:  Work Provider Location: Office/Clinic  PCP:  Donita Brooks, MD  Cardiologist:  Bryan Lemma, MD  Electrophysiologist:  None   Chief Complaint:   Chief Complaint  Patient presents with   Follow up Cardiopulmonary stress test     Patient c/o shortness of breath while walking 3 miles with a pounding in her head. Medications reviewed by the patient verbally.     ====================================  ASSESSMENT & PLAN:    Problem List Items Addressed This Visit     Rapid palpitations - Primary (Chronic)    Most recent episode of tachycardia correlated with exercise - hiking on a hot day -- suspect inadequate hydration.  => Discussed need for Pre-hydration prior to walking & continuous hydration during & post exercise. -- Tachycardia related to hypovolemia & palpitations likely from hyperdynamic LV during exercise.   No associated angina.       Morbid obesity (HCC) (Chronic)    CPX suggests deconditioning & obesity mostly etiologies to  explain DOE symptoms.   Stressed importance of dietary adjustment & continued exercise.       DOE (dyspnea on exertion) (Chronic)    CPX suggests Normal functional capacity & no clear CP limitations - although diastolic dysfunction cannot be ruled out. - Echo did not indicate evidence of Diastolic Dysfunction (DDfxn) - she is young & not hypertensive making DDfxn unlikley except @ peak exercise. = More c/w deconditioning.   Recommend continuing to gradually increase exercise, but ensure adequate hydration.        ====================================  History of Present Illness:    Belinda Martin is a 47 y.o. female with PMH notable for ADHD, fatigue, palpitations (Zio patch monitor with PACs and short PAT runs), and Exertional Dyspnea who presents via audio/video conferencing for a telehealth visit today as a 32-month follow-up.  Belinda Martin was last seen July 21, 2022 as a 13-month follow-up to discuss results of her echocardiogram showing normal EF no wall motion abnormalities.  Normal diastolic parameters.  She was still noting exertional dyspnea simply walking in from the car.  Very unusual.  No chest tightness or pressure.  Fluttering sensations have improved.  They usually only occur with waking up from sleep. => CPX ordered.  Hospitalizations:  None  Recent - Interim CV studies:   The following studies were reviewed today: CPX (Cardiopulmonary Stress Test) 10/10/2022:(Preliminary report only)  Exercised for 12:15 minutes.  Stopped because of 8/10 dyspnea.  Heart rate increased from 84 to 170  bpm (97% MPHR).  Appropriate BP response 128/72 -> 216/66; peak VO2 23.8 (128%.).  Overall good effort.  Maintained pulse ox.  95 to 97%.  Occasional PVCs but no sustained arrhythmias or ST changes noted with exercise. Normal functional capacity compared to sedentary norms.  No clear CP limitation.  However, significant hypertensive response to exercise with mildly elevated VE/VCO2 slope  suggesting diastolic dysfunction.  However, findings overall indicative of primarily limitation due to body habitus.  Belinda Martin History   Belinda Martin follows up today via telehealth - converted to Telephone (audio did not work) Therapist, occupational.  She still notes having DOE episodes - notably this past weekend, she went on a 3 mile hike with her husband & after ~2 miles, started noting SOB & feeling Hot/flushed (was a very hot day).  Started feeling her HR go up & was lightheaded / dizzy with a HA - felt like heartbeat was "pounding in her head".  Was able to "cool down" & rest a bit, then make it back to their car. She finally felt better after sitting in AC to cool down.  No notable CP.  No SOB at rest. Not really noting spells that awaken her.   + Social Stress still present.  Admits that she "probably does not drink enough water during the course of the day.  Some SOB bending over, but no PND/orthopnea or edema.  During these spells, noted HR up, but not irregular & no increased HR in the absence of activity.    Cardiovascular ROS: positive for - dyspnea on exertion, palpitations, rapid heart rate, shortness of breath, and lightheaded,dizziness ~ near syncope. HA - heart "pounding" negative for - chest pain, edema, irregular heartbeat, loss of consciousness, orthopnea, paroxysmal nocturnal dyspnea, or TIA/amaurosis fugax; claudication.    ROS:  Please see the history of present illness.     Review of Systems  Constitutional:  Positive for malaise/fatigue (more exercise intolerance). Negative for weight loss.  HENT:  Negative for congestion.   Respiratory:  Positive for shortness of breath (per HPI).   Cardiovascular:  Negative for claudication and leg swelling.  Gastrointestinal:  Negative for blood in stool and melena.  Genitourinary:  Negative for hematuria.  Musculoskeletal:  Negative for joint pain.  Neurological:  Positive for dizziness and headaches. Negative for focal weakness and loss of  consciousness.  Psychiatric/Behavioral:  Negative for depression and memory loss. The patient is nervous/anxious. The patient does not have insomnia.    PMH & PSH reviewed  CV Procedure Laterality Date   TRANSTHORACIC ECHOCARDIOGRAM  01/2009   EF 65-70%, normal valves   TRANSTHORACIC ECHOCARDIOGRAM  05/30/2022   NORMAL ECHO.  EF 60 to 65% no RWMA. " Normal diastolic parameters".  Normal RV size function and pressures.  Normal valves.  Normal RAP.     Current Meds  Medication Sig   albuterol (VENTOLIN HFA) 108 (90 Base) MCG/ACT inhaler Inhale 2 puffs into the lungs every 6 (six) hours as needed for wheezing or shortness of breath.   ascorbic acid (VITAMIN C) 500 MG tablet Take 500 mg by mouth daily.   Azelaic Acid 15 % gel After skin is thoroughly washed and patted dry, gently but thoroughly massage a thin film of azelaic acid cream into the affected area twice daily, in the morning and evening.   Biotin 10 MG TABS Take by mouth daily.   cholecalciferol (VITAMIN D3) 25 MCG (1000 UNIT) tablet Take 1,000 Units by mouth daily.   clotrimazole-betamethasone (LOTRISONE) cream  Apply externally BID prn sx up to 2 wks   Drospirenone (SLYND) 4 MG TABS Take 1 tablet (4 mg total) by mouth daily.   fexofenadine (ALLEGRA) 180 MG tablet Take 180 mg by mouth daily.   folic acid (FOLVITE) 1 MG tablet Take 3 mg by mouth daily.   Glucosamine 500 MG CAPS Take 1,000 mg by mouth daily.   hydrocortisone 2.5 % cream Apply topically 2 (two) times daily as needed (Rash). To area inner thighs and folds for up to 2 weeks. Avoid applying to face, groin, and axilla. Use as directed. Risk of skin atrophy with long-term use reviewed.   Ibuprofen-Famotidine (DUEXIS) 800-26.6 MG TABS Take 1 tablet by mouth as needed.   Multiple Vitamins-Calcium (ONE-A-DAY WOMENS FORMULA PO) Take 1 tablet by mouth daily.   Omega-3 Fatty Acids (FISH OIL) 1000 MG CAPS Take 2 capsules by mouth daily.   tiZANidine (ZANAFLEX) 4 MG tablet Take 1  tablet (4 mg total) by mouth every 6 (six) hours as needed for muscle spasms.   TRIAMCINOLONE ACETONIDE, TOP, (TRIANEX) 0.05 % OINT Apply twice daily to hands for up to 2 weeks then weekends only. Avoid applying to face, groin, and axilla. Use as directed. Risk of skin atrophy with long-term use reviewed.   vitamin B-12 (CYANOCOBALAMIN) 500 MCG tablet Take 1,000 mcg by mouth daily.     Allergies:   Amoxicillin   Social History   Tobacco Use   Smoking status: Never   Smokeless tobacco: Never  Vaping Use   Vaping status: Never Used  Substance Use Topics   Alcohol use: Not Currently    Comment: rare   Drug use: No     Family Hx: The patient's family history includes Arrhythmia in her mother; Atrial fibrillation in her father; Crohn's disease in her maternal grandmother; Deep vein thrombosis in her father; Diabetes in her maternal grandmother; Endometriosis in her mother; Fibromyalgia in her mother; Heart failure in her paternal grandfather; Heart murmur in her mother and paternal uncle; Hypertension in her father; Irritable bowel syndrome in her mother; Peripheral Artery Disease in her father; Stomach cancer in her paternal grandfather. There is no history of Coronary artery disease or Breast cancer.   Labs/Other Tests and Data Reviewed:    EKG:  No ECG reviewed.  Recent Labs: 02/07/2022: ALT 15; BUN 13; Creatinine, Ser 0.91; Hemoglobin 14.2; Platelets 295; Potassium 4.1; Sodium 142; TSH 3.590   Recent Lipid Panel No results found for: "CHOL", "TRIG", "HDL", "CHOLHDL", "LDLCALC", "LDLDIRECT"  Wt Readings from Last 3 Encounters:  10/20/22 255 lb (115.7 kg)  09/12/22 259 lb 6.4 oz (117.7 kg)  07/25/22 255 lb (115.7 kg)     Objective:    Vital Signs:  BP 124/75   Temp (!) 81 F (27.2 C)   Ht 5\' 6"  (1.676 m)   Wt 255 lb (115.7 kg)   BMI 41.16 kg/m   VITAL SIGNS:  reviewed GEN:  no acute distress NEURO:  alert and oriented x 3, no obvious focal deficit PSYCH:  normal  affect  ==========================================  COVID-19 Education: The signs and symptoms of COVID-19 were discussed with the patient and how to seek care for testing (follow up with PCP or arrange E-visit).   The importance of social distancing was discussed today.  Time:   Today, I have spent 23 minutes with the patient with telehealth technology discussing the above problems.   -- Detailed discussion of CPX results & plans for treatment  An additional 18 minutes  spent charting (reviewing prior notes, hospital records, studies, labs etc.) Total 41 minutes   Medication Adjustments/Labs and Tests Ordered: Current medicines are reviewed at length with the patient today.  Concerns regarding medicines are outlined above.   Patient Instructions  Medication Instructions:  Your physician recommends that you continue on your current medications as directed. Please refer to the Current Medication list given to you today.  *If you need a refill on your cardiac medications before your next appointment, please call your pharmacy*  Lab Work: -None ordered  Testing/Procedures: -None ordered  Follow-Up: At [facility], you and your health needs are our priority.  As part of our continuing mission to provide you with exceptional heart care, we have created designated Provider Care Teams.  These Care Teams include your primary Cardiologist (physician) and Advanced Practice Providers (APPs -  Physician Assistants and Nurse Practitioners) who all work together to provide you with the care you need, when you need it.  Your next appointment:   1 year(s)  Provider:   You may see Bryan Lemma, MD or one of the following Advanced Practice Providers on your designated Care Team:   Nicolasa Ducking, NP Eula Listen, PA-C Cadence Fransico Michael, PA-C Charlsie [facility], NP    Other Instructions -Ensure that you hydrate pre-workout, intra-workout, and post-workout. Be sure to take frequent breaks  during workout if you feel heart rate increasing too high.     Signed, Bryan Lemma, MD  10/21/2022 8:40 AM    [facility]

## 2022-10-20 ENCOUNTER — Encounter: Payer: Self-pay | Admitting: Cardiology

## 2022-10-20 ENCOUNTER — Other Ambulatory Visit: Payer: Self-pay

## 2022-10-20 ENCOUNTER — Ambulatory Visit: Payer: Commercial Managed Care - PPO | Attending: Cardiology | Admitting: Cardiology

## 2022-10-20 ENCOUNTER — Telehealth: Payer: Self-pay | Admitting: Family Medicine

## 2022-10-20 VITALS — BP 124/75 | Temp 81.0°F | Ht 66.0 in | Wt 255.0 lb

## 2022-10-20 DIAGNOSIS — R002 Palpitations: Secondary | ICD-10-CM

## 2022-10-20 DIAGNOSIS — G479 Sleep disorder, unspecified: Secondary | ICD-10-CM

## 2022-10-20 DIAGNOSIS — Z6841 Body Mass Index (BMI) 40.0 and over, adult: Secondary | ICD-10-CM

## 2022-10-20 DIAGNOSIS — R5383 Other fatigue: Secondary | ICD-10-CM

## 2022-10-20 DIAGNOSIS — R0609 Other forms of dyspnea: Secondary | ICD-10-CM

## 2022-10-20 DIAGNOSIS — G932 Benign intracranial hypertension: Secondary | ICD-10-CM

## 2022-10-20 DIAGNOSIS — R7303 Prediabetes: Secondary | ICD-10-CM

## 2022-10-20 DIAGNOSIS — Z13220 Encounter for screening for lipoid disorders: Secondary | ICD-10-CM

## 2022-10-20 NOTE — Patient Instructions (Signed)
Medication Instructions:  Your physician recommends that you continue on your current medications as directed. Please refer to the Current Medication list given to you today.  *If you need a refill on your cardiac medications before your next appointment, please call your pharmacy*  Lab Work: -None ordered  Testing/Procedures: -None ordered  Follow-Up: At [facility], you and your health needs are our priority.  As part of our continuing mission to provide you with exceptional heart care, we have created designated Provider Care Teams.  These Care Teams include your primary Cardiologist (physician) and Advanced Practice Providers (APPs -  Physician Assistants and Nurse Practitioners) who all work together to provide you with the care you need, when you need it.  Your next appointment:   1 year(s)  Provider:   You may see Bryan Lemma, MD or one of the following Advanced Practice Providers on your designated Care Team:   Nicolasa Ducking, NP Eula Listen, PA-C Cadence Fransico Michael, PA-C Charlsie [facility], NP    Other Instructions -Ensure that you hydrate pre-workout, intra-workout, and post-workout. Be sure to take frequent breaks during workout if you feel heart rate increasing too high.

## 2022-10-20 NOTE — Telephone Encounter (Signed)
Patient called to request a hand written script for fasting labs prior to upcoming cpe appt on 11/28/22. Patient stated she lives 45 minutes away and has a work schedule conflict which makes it difficult to come for labs in the morning.  Patient will go to either a local [facility] or [facility] [employer]; requesting for script to be mailed to address on file.   Please advise at [phone].

## 2022-10-21 ENCOUNTER — Encounter: Payer: Self-pay | Admitting: Cardiology

## 2022-10-21 NOTE — Assessment & Plan Note (Signed)
Most recent episode of tachycardia correlated with exercise - hiking on a hot day -- suspect inadequate hydration.  => Discussed need for Pre-hydration prior to walking & continuous hydration during & post exercise. -- Tachycardia related to hypovolemia & palpitations likely from hyperdynamic LV during exercise.   No associated angina.

## 2022-10-21 NOTE — Assessment & Plan Note (Signed)
CPX suggests Normal functional capacity & no clear CP limitations - although diastolic dysfunction cannot be ruled out. - Echo did not indicate evidence of Diastolic Dysfunction (DDfxn) - she is young & not hypertensive making DDfxn unlikley except @ peak exercise. = More c/w deconditioning.   Recommend continuing to gradually increase exercise, but ensure adequate hydration.

## 2022-10-21 NOTE — Assessment & Plan Note (Signed)
CPX suggests deconditioning & obesity mostly etiologies to explain DOE symptoms.   Stressed importance of dietary adjustment & continued exercise.

## 2022-11-22 DIAGNOSIS — G479 Sleep disorder, unspecified: Secondary | ICD-10-CM | POA: Diagnosis not present

## 2022-11-22 DIAGNOSIS — G932 Benign intracranial hypertension: Secondary | ICD-10-CM | POA: Diagnosis not present

## 2022-11-22 DIAGNOSIS — R002 Palpitations: Secondary | ICD-10-CM | POA: Diagnosis not present

## 2022-11-22 DIAGNOSIS — R5383 Other fatigue: Secondary | ICD-10-CM | POA: Diagnosis not present

## 2022-11-22 DIAGNOSIS — R7303 Prediabetes: Secondary | ICD-10-CM | POA: Diagnosis not present

## 2022-11-22 DIAGNOSIS — Z6841 Body Mass Index (BMI) 40.0 and over, adult: Secondary | ICD-10-CM | POA: Diagnosis not present

## 2022-11-22 DIAGNOSIS — Z13220 Encounter for screening for lipoid disorders: Secondary | ICD-10-CM | POA: Diagnosis not present

## 2022-11-23 LAB — LAB REPORT - SCANNED
A1c: 5.8
EGFR: 96

## 2022-11-28 ENCOUNTER — Encounter: Payer: Self-pay | Admitting: Family Medicine

## 2022-11-28 ENCOUNTER — Ambulatory Visit (INDEPENDENT_AMBULATORY_CARE_PROVIDER_SITE_OTHER): Payer: Commercial Managed Care - PPO | Admitting: Family Medicine

## 2022-11-28 VITALS — BP 120/78 | HR 79 | Temp 98.7°F | Ht 66.0 in | Wt 261.4 lb

## 2022-11-28 DIAGNOSIS — G4710 Hypersomnia, unspecified: Secondary | ICD-10-CM | POA: Diagnosis not present

## 2022-11-28 DIAGNOSIS — R5383 Other fatigue: Secondary | ICD-10-CM

## 2022-11-28 DIAGNOSIS — Z6841 Body Mass Index (BMI) 40.0 and over, adult: Secondary | ICD-10-CM | POA: Diagnosis not present

## 2022-11-28 DIAGNOSIS — G479 Sleep disorder, unspecified: Secondary | ICD-10-CM

## 2022-11-28 DIAGNOSIS — Z1211 Encounter for screening for malignant neoplasm of colon: Secondary | ICD-10-CM

## 2022-11-28 DIAGNOSIS — Z0001 Encounter for general adult medical examination with abnormal findings: Secondary | ICD-10-CM

## 2022-11-28 NOTE — Progress Notes (Signed)
Subjective:    Patient ID: Belinda Martin, female    DOB: October 29, 1975, 47 y.o.   MRN: [id number]  Here for CPE.  Gets pap and pelvic at GYN.  Reports hypersomnolence.  Reports excessive fatigue.  Husband states she snores.  Falls asleep easily if sitting still, reading, watching TV, riding in cars, after lunch, etc.  Husband also states she moves her limbs constantly during sleep.   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  EF 60 to 65% no RWMA. " Normal diastolic parameters".  Normal RV size function and pressures.  Normal valves.  Normal RAP.   Current Outpatient Medications on File Prior to Visit  Medication Sig Dispense Refill   albuterol (VENTOLIN HFA) 108 (90 Base) MCG/ACT inhaler Inhale 2 puffs into the lungs every 6 (six) hours as needed for wheezing or shortness of breath. 8 g 1   ascorbic acid (VITAMIN C) 500 MG tablet Take 500 mg by mouth daily.     Azelaic Acid 15 % gel After skin is thoroughly washed and patted dry, gently but thoroughly massage a thin film of azelaic acid cream into the affected area twice daily, in the morning and evening.  50 g 3   Biotin 10 MG TABS Take by mouth daily.     cholecalciferol (VITAMIN D3) 25 MCG (1000 UNIT) tablet Take 1,000 Units by mouth daily.     clotrimazole-betamethasone (LOTRISONE) cream Apply externally BID prn sx up to 2 wks 15 g 0   Drospirenone (SLYND) 4 MG TABS Take 1 tablet (4 mg total) by mouth daily. 84 tablet 3   fexofenadine (ALLEGRA) 180 MG tablet Take 180 mg by mouth daily.     folic acid (FOLVITE) 1 MG tablet Take 3 mg by mouth daily.     Glucosamine 500 MG CAPS Take 1,000 mg by mouth daily.     hydrocortisone 2.5 % cream Apply topically 2 (two) times daily as needed (Rash). To area inner thighs and folds for up to 2 weeks. Avoid applying to face, groin, and axilla. Use as directed. Risk of skin atrophy with long-term use reviewed. 30 g 1   Ibuprofen-Famotidine (DUEXIS) 800-26.6 MG TABS Take 1 tablet by mouth as needed.     Multiple Vitamins-Calcium (ONE-A-DAY WOMENS FORMULA PO) Take 1 tablet by mouth daily.     Omega-3 Fatty Acids (FISH OIL) 1000 MG CAPS Take 2 capsules by mouth daily.     tiZANidine (ZANAFLEX) 4 MG tablet Take 1  tablet (4 mg total) by mouth every 6 (six) hours as needed for muscle spasms. 30 tablet 1   TRIAMCINOLONE ACETONIDE, TOP, (TRIANEX) 0.05 % OINT Apply twice daily to hands for up to 2 weeks then weekends only. Avoid applying to face, groin, and axilla. Use as directed. Risk of skin atrophy with long-term use reviewed. 110 g 1   vitamin B-12 (CYANOCOBALAMIN) 500 MCG tablet Take 1,000 mcg by mouth daily.     No current facility-administered medications on file prior to visit.   Allergies  Allergen Reactions   Amoxicillin     REACTION: hives   Social History   Socioeconomic History   Marital status: Married    Spouse name: Arlys John   Number of children: 0   Years of education: Not on file   Highest education level: Master's degree (e.g., MA, MS, MEng, MEd, MSW, MBA)  Occupational History   Occupation: [employer]    Comment: [employer]all[employer]  Tobacco Use   Smoking status: Never   Smokeless tobacco: Never  Vaping Use   Vaping status: Never Used  Substance and Sexual Activity   Alcohol use: Not Currently    Comment: rare   Drug use: No   Sexual activity: Yes    Partners: Male    Birth control/protection: Pill  Other Topics Concern   Not on file  Social History Narrative   Remarried; lives in [place]      She has lost about 90 pounds in the last year-dietary modification and try to increase exercise level.   Her new husband has encouraged her to increase her exercise => she walks most days.  They go hiking on the weekends.     Social Determinants of Health   Financial Resource Strain: Not on file  Food Insecurity: Not on file  Transportation Needs: Not on file  Physical Activity: Inactive (05/29/2017)   Exercise Vital Sign    Days of Exercise per Week: 0 days    Minutes of Exercise per Session: 0 min  Stress: Stress Concern Present (05/29/2017)   [employer] of Occupational Health - Occupational Stress Questionnaire    Feeling of Stress : To some extent  Social Connections: Not on file  Intimate Partner Violence: Not on file      Review of Systems  Cardiovascular:  Positive for palpitations.       Objective:   Physical Exam Vitals reviewed.  Constitutional:      Appearance: She is well-developed.  Cardiovascular:     Rate and Rhythm: Normal rate and regular rhythm.     Heart sounds: Normal heart sounds.  Pulmonary:     Effort: Pulmonary effort is normal.     Breath sounds: Normal breath sounds.           Assessment & Plan:  Hypersomnolence - Plan: Ambulatory referral to Sleep Studies  Sleep disorder  Other fatigue  Class 3 severe obesity with body mass index (BMI) of 40.0 to 44.9 in adult, unspecified obesity type, unspecified whether serious comorbidity present (HCC) Recommended sleep study for hypersomnolence.  Suspect OSA or RLS may be causing fatigue and  sleepiness.  Schedule colonoscopy at 47.  Pap and pelvis is UTD.  Recommended annual mammogram.  Reviewed cbc, cmp, flp, tsh, and A1c all were ok. .  Discussed diet exercise and weight loss.  Consult nutrition.  Discussed ozempic.

## 2022-12-08 ENCOUNTER — Encounter: Payer: Self-pay | Admitting: Family Medicine

## 2022-12-12 ENCOUNTER — Encounter: Payer: Self-pay | Admitting: *Deleted

## 2023-01-11 DIAGNOSIS — M7581 Other shoulder lesions, right shoulder: Secondary | ICD-10-CM | POA: Diagnosis not present

## 2023-01-13 ENCOUNTER — Encounter: Payer: Self-pay | Admitting: Family Medicine

## 2023-01-23 ENCOUNTER — Encounter: Payer: Self-pay | Admitting: Dietician

## 2023-01-23 ENCOUNTER — Encounter: Payer: Commercial Managed Care - PPO | Attending: Family Medicine | Admitting: Dietician

## 2023-01-23 DIAGNOSIS — J45909 Unspecified asthma, uncomplicated: Secondary | ICD-10-CM

## 2023-01-23 NOTE — Patient Instructions (Addendum)
Increase variety of food/ meal options with list of protein ideas.  Check into [facility] app for recipe ideas that can be individualized. Continue to eat at regular intervals and make generally healthy food choices, great job so far! For long-term healthy macronutrient balance, aim for about 40% carbs (about 165g), 30% protein (about 94g), and 30% fat (about 55g). Percentages and exact grams are flexible.  Continue to resume/ increase exercise as tolerated.

## 2023-01-23 NOTE — Progress Notes (Signed)
Medical Nutrition Therapy: Visit start time: 1550  end time: 1655  Assessment:   Referral Diagnosis: obesity Other medical history/ diagnoses: asthma, IBS Psychosocial issues/ stress concerns: none  Medications, supplements: reconciled list in medical record   Current weight: 261.7lbs Height: 5'7" BMI: 40.99   Progress and evaluation:  Lost below 200lbs during pandemic when working 2 jobs. Took adderall for a time due to fatigue, engaged in regular exercise.  Has been following Macro managed pattern, occ struggles to reach protein goal  Craves sweets, but works to limit. Uses 1 Tbsp Lily[employer] lower sugar choc chips with yogurt Using [employer] app to track daily food intake;  Husband enjoys dining out, which becomes a challenge to manage calories. Reports family history for heart disease, blood clots and wants to reduce her risk. Food allergies: none   Dietary Intake:  Usual eating pattern includes 2-3 meals and 1-2 snacks per day. Dining out frequency: some meals per week. Who plans meals/ buys groceries? Self, spouse Who prepares meals? Self, spouse  Breakfast: sausage breakfast casserole; 3 eggs, 1/4c cheese, 1 pc bread Snack: yogurt (or at lunch) Lunch: chobani zero sugar yogurt with blueberries or strawberries + [place] wrap with cheese, baby spinach (50-60cal wrap); or 4oz chicken + spinach + cheese + 1/2 Tbsp ranch dressing Snack: banana, protein shake usu on way home from work Supper: frozen chicken patty no sugar bbq sauce; marinated chicken breast + broccoli/ cauliflower (does not like steamed) + fingerling potatoes or sm amt rice/ occ mac and cheese Snack: none Beverages: diet Mt Dew in am 12-24oz in am, caff free in pm 12-24oz; trying to drink 40oz water (with flavoring) daily  Physical activity: walking about 1 mile daily planning to increase. Resuming boot camp class 1x a week on Saturdays   Intervention:   Nutrition Care Education:   Basic nutrition: appropriate  nutrient balance; appropriate meal and snack schedule; general nutrition guidelines    Weight control: importance of low sugar and low fat choices; hand comparisons for food portions and healthy portion sizes; estimated energy needs for weight loss at 1600-1700 kcal, provided guidance for 40% CHO, 30% protein, and 30% fat per patient preference; role of physical activity Advanced nutrition:  recipe options; cooking techniques; dining out   Other intervention notes: Patient has been working on diet and lifestyle changes to promote weight loss and is motivated to continue.  Discussion today focused on sustainable and healthy macronutrient balance, and options for increasing variety in meals. Established goals with direction from patient.   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  As evidenced by patient with current BMI of 40.99, working on lifestyle changes to promote weight loss.   Education Materials given:  Designer, industrial/product with food lists, sample meal pattern Lifelong bariatric diet guidelines Portion Guide [employer]) Visit summary with goals/ instructions   Learner/ who was taught:  Patient   Level of understanding: Verbalizes/ demonstrates competency  Demonstrated degree of understanding via:   Teach back Learning barriers: None  Willingness to learn/ readiness for change: Eager, change in progress  Monitoring and Evaluation:  Dietary intake, exercise, and body weight      follow up:  04/17/23 at 4:00pm

## 2023-01-24 ENCOUNTER — Ambulatory Visit
Admission: RE | Admit: 2023-01-24 | Discharge: 2023-01-24 | Disposition: A | Discharge status: Home / Self Care | Payer: Commercial Managed Care - PPO | Source: Ambulatory Visit | Attending: Obstetrics and Gynecology | Admitting: Obstetrics and Gynecology

## 2023-01-24 DIAGNOSIS — Z1231 Encounter for screening mammogram for malignant neoplasm of breast: Secondary | ICD-10-CM | POA: Insufficient documentation

## 2023-03-07 ENCOUNTER — Encounter: Payer: Self-pay | Admitting: Family Medicine

## 2023-03-07 ENCOUNTER — Other Ambulatory Visit: Payer: Self-pay

## 2023-03-07 ENCOUNTER — Ambulatory Visit: Payer: Commercial Managed Care - PPO | Admitting: Family Medicine

## 2023-03-07 VITALS — BP 126/72 | HR 83 | Temp 98.2°F | Ht 67.0 in | Wt 264.6 lb

## 2023-03-07 DIAGNOSIS — G43109 Migraine with aura, not intractable, without status migrainosus: Secondary | ICD-10-CM

## 2023-03-07 MED ORDER — TIZANIDINE HCL 4 MG PO TABS: 4.0000 mg | ORAL_TABLET | Freq: Four times a day (QID) | ORAL | 0 refills | Status: AC | PRN

## 2023-03-07 MED ORDER — RIZATRIPTAN BENZOATE 10 MG PO TBDP: 10.0000 mg | ORAL_TABLET | ORAL | 0 refills | Status: DC | PRN

## 2023-03-07 NOTE — Progress Notes (Signed)
Subjective:    Patient ID: Belinda Martin, female    DOB: 1975/08/30, 47 y.o.   MRN: [id number]  Patient has a history of migraines.  She states that recently she has developed vision loss.  She states that suddenly she will see a blurry area in her field of vision.  This will move depending on which direction she is looking.  It affects both eyes.  She describes it as a blurry spot.  She would then will get a pulsing throbbing headache and nausea.  This is occurring 2-3 times a month.  She saw an ophthalmologist who diagnosed her with ocular migraines. 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  EF 60 to 65% no RWMA. " Normal diastolic parameters".  Normal RV size function and pressures.  Normal valves.  Normal RAP.   Current Outpatient Medications on File Prior to Visit  Medication Sig Dispense Refill   albuterol (VENTOLIN HFA) 108 (90 Base) MCG/ACT inhaler Inhale 2 puffs into the lungs every 6 (six) hours as needed for wheezing or shortness of breath. 8 g 1   ascorbic acid (VITAMIN C) 500 MG tablet Take 500 mg by mouth daily.     Azelaic Acid 15 % gel  After skin is thoroughly washed and patted dry, gently but thoroughly massage a thin film of azelaic acid cream into the affected area twice daily, in the morning and evening. 50 g 3   Biotin 10 MG TABS Take by mouth daily.     cholecalciferol (VITAMIN D3) 25 MCG (1000 UNIT) tablet Take 1,000 Units by mouth daily.     clotrimazole-betamethasone (LOTRISONE) cream Apply externally BID prn sx up to 2 wks 15 g 0   Drospirenone (SLYND) 4 MG TABS Take 1 tablet (4 mg total) by mouth daily. 84 tablet 3   fexofenadine (ALLEGRA) 180 MG tablet Take 180 mg by mouth daily.     folic acid (FOLVITE) 1 MG tablet Take 3 mg by mouth daily.     Glucosamine 500 MG CAPS Take 1,000 mg by mouth daily.     hydrocortisone 2.5 % cream Apply topically 2 (two) times daily as needed (Rash). To area inner thighs and folds for up to 2 weeks. Avoid applying to face, groin, and axilla. Use as directed. Risk of skin atrophy with long-term use reviewed. 30 g 1   Ibuprofen-Famotidine (DUEXIS) 800-26.6 MG TABS Take 1 tablet by mouth as needed.     Multiple Vitamins-Calcium (ONE-A-DAY WOMENS FORMULA  PO) Take 1 tablet by mouth daily.     Omega-3 Fatty Acids (FISH OIL) 1000 MG CAPS Take 2 capsules by mouth daily.     tiZANidine (ZANAFLEX) 4 MG tablet Take 1 tablet (4 mg total) by mouth every 6 (six) hours as needed for muscle spasms. 30 tablet 1   TRIAMCINOLONE ACETONIDE, TOP, (TRIANEX) 0.05 % OINT Apply twice daily to hands for up to 2 weeks then weekends only. Avoid applying to face, groin, and axilla. Use as directed. Risk of skin atrophy with long-term use reviewed. 110 g 1   vitamin B-12 (CYANOCOBALAMIN) 500 MCG tablet Take 1,000 mcg by mouth daily.     No current facility-administered medications on file prior to visit.   Allergies  Allergen Reactions   Amoxicillin     REACTION: hives   Social History   Socioeconomic History   Marital status: Married    Spouse name: Arlys John   Number of children: 0   Years of education: Not  on file   Highest education level: Master's degree (e.g., MA, MS, MEng, MEd, MSW, MBA)  Occupational History   Occupation: [employer]    Comment: Interior and spatial designer Med  Tobacco Use   Smoking status: Never   Smokeless tobacco: Never  Vaping Use   Vaping status: Never Used  Substance and Sexual Activity   Alcohol use: Not Currently    Comment: rare   Drug use: No   Sexual activity: Yes    Partners: Male    Birth control/protection: Pill  Other Topics Concern   Not on file  Social History Narrative   Remarried; lives in [place]      She has lost about 90 pounds in the last year-dietary modification and try to increase exercise level.   Her new husband has encouraged her to increase her exercise => she walks most days.  They go hiking on the weekends.     Social Determinants of Health   Financial Resource Strain: Not on file  Food Insecurity: Not on file  Transportation Needs: Not on file  Physical Activity: Inactive (05/29/2017)   Exercise Vital Sign    Days of Exercise per Week: 0 days    Minutes of Exercise per Session: 0 min  Stress: Stress Concern Present (05/29/2017)   [employer] of Occupational Health - Occupational Stress Questionnaire    Feeling of Stress : To some extent  Social Connections: Not on file  Intimate Partner Violence: Not on file      Review of Systems  Cardiovascular:  Positive for palpitations.       Objective:   Physical Exam Vitals reviewed.  Constitutional:      Appearance: She is well-developed.  Eyes:     General: No visual field deficit. Cardiovascular:     Rate and Rhythm: Normal rate and regular rhythm.     Heart sounds: Normal heart sounds.  Pulmonary:     Effort: Pulmonary effort is normal.     Breath sounds: Normal breath sounds.  Neurological:     Cranial Nerves: No cranial nerve deficit, dysarthria or facial asymmetry.     Sensory: No sensory deficit.     Motor: No weakness.     Coordination:  Coordination normal.           Assessment & Plan:   Ocular migraine Try Maxalt 10 mg melting tablets at the first sign of migraine headache as an abortive therapy.  If the frequency or severity worsens, consider preventative  strategies such as Topamax or aimovig.

## 2023-03-08 ENCOUNTER — Other Ambulatory Visit: Payer: Self-pay

## 2023-03-20 ENCOUNTER — Ambulatory Visit: Payer: Commercial Managed Care - PPO

## 2023-03-27 ENCOUNTER — Ambulatory Visit: Payer: Commercial Managed Care - PPO | Attending: Otolaryngology

## 2023-03-27 DIAGNOSIS — R0683 Snoring: Secondary | ICD-10-CM | POA: Diagnosis not present

## 2023-03-27 DIAGNOSIS — I491 Atrial premature depolarization: Secondary | ICD-10-CM | POA: Diagnosis not present

## 2023-03-31 ENCOUNTER — Encounter: Payer: Self-pay | Admitting: Obstetrics and Gynecology

## 2023-04-09 ENCOUNTER — Telehealth (INDEPENDENT_AMBULATORY_CARE_PROVIDER_SITE_OTHER): Payer: Commercial Managed Care - PPO | Admitting: Pulmonary Disease

## 2023-04-09 DIAGNOSIS — G4710 Hypersomnia, unspecified: Secondary | ICD-10-CM | POA: Diagnosis not present

## 2023-04-09 NOTE — Telephone Encounter (Signed)
 No significant sleep disordered breathing or PLMs on sleep study

## 2023-04-10 ENCOUNTER — Encounter: Payer: Self-pay | Admitting: Family Medicine

## 2023-04-14 ENCOUNTER — Other Ambulatory Visit: Payer: Self-pay | Admitting: Family Medicine

## 2023-04-14 ENCOUNTER — Encounter: Payer: Self-pay | Admitting: Family Medicine

## 2023-04-14 ENCOUNTER — Ambulatory Visit: Payer: Commercial Managed Care - PPO | Admitting: Family Medicine

## 2023-04-14 ENCOUNTER — Telehealth: Payer: Self-pay | Admitting: Family Medicine

## 2023-04-14 ENCOUNTER — Telehealth (INDEPENDENT_AMBULATORY_CARE_PROVIDER_SITE_OTHER): Payer: Commercial Managed Care - PPO | Admitting: Family Medicine

## 2023-04-14 DIAGNOSIS — J0100 Acute maxillary sinusitis, unspecified: Secondary | ICD-10-CM | POA: Diagnosis not present

## 2023-04-14 DIAGNOSIS — U071 COVID-19: Secondary | ICD-10-CM

## 2023-04-14 DIAGNOSIS — J069 Acute upper respiratory infection, unspecified: Secondary | ICD-10-CM

## 2023-04-14 DIAGNOSIS — J019 Acute sinusitis, unspecified: Secondary | ICD-10-CM | POA: Insufficient documentation

## 2023-04-14 MED ORDER — BENZONATATE 100 MG PO CAPS: 100.0000 mg | ORAL_CAPSULE | Freq: Three times a day (TID) | ORAL | 0 refills | Status: DC

## 2023-04-14 MED ORDER — HYDROCODONE BIT-HOMATROP MBR 5-1.5 MG/5ML PO SOLN: 5.0000 mL | Freq: Three times a day (TID) | ORAL | 0 refills | Status: DC | PRN

## 2023-04-14 MED ORDER — AZITHROMYCIN 250 MG PO TABS: ORAL_TABLET | ORAL | 0 refills | Status: AC

## 2023-04-14 MED ORDER — NIRMATRELVIR/RITONAVIR (PAXLOVID)TABLET: 3.0000 | ORAL_TABLET | Freq: Two times a day (BID) | ORAL | 0 refills | Status: AC

## 2023-04-14 MED ORDER — FLUTICASONE PROPIONATE 50 MCG/ACT NA SUSP: 2.0000 | Freq: Every day | NASAL | 6 refills | Status: DC

## 2023-04-14 NOTE — Telephone Encounter (Signed)
 Left message on patient's voicemail to return call. Offered patient a virtual appointment with Dr. Riley Nearing for an earlier time slot today due to expected inclement weather.

## 2023-04-14 NOTE — Telephone Encounter (Signed)
 Copied from CRM [id number]. Topic: Clinical - Medication Question >> Apr 14, 2023  2:09 PM Carlatta H wrote: Reason for CRM: Dr Riley Nearing wanted patients covid results//Patient was tested positive and would to know if that would change the prescription given//

## 2023-04-14 NOTE — Progress Notes (Addendum)
 Patient Office Visit  Assessment & Plan:  Upper respiratory tract infection, unspecified type -     Azithromycin ; Take 2 tablets on day 1, then 1 tablet daily on days 2 through 5  Dispense: 6 tablet; Refill: 0 -     Benzonatate ; Take 1 capsule (100 mg total) by mouth 3 (three) times daily.  Dispense: 30 capsule; Refill: 0 -     Fluticasone  Propionate; Place 2 sprays into both nostrils daily.  Dispense: 16 g; Refill: 6  Acute non-recurrent maxillary sinusitis  Z-Pak sent to pharmacy, Flonase , Tessalon  Perles during the day and cough medicine for nighttime.  If no improvement she is to notify us .  Patient will also check for COVID and send us  a message via MyChart. Addendum-Pt tested positive for COVID- Dr. Duanne sent Paxlovid  to pharmacy.  Pt will not take Zpack.   No follow-ups on file.   Subjective:    Patient ID: Belinda Martin, female    DOB: [birth year]  Age: 48 y.o. MRN: [id number]  No chief complaint on file.   HPI Patient presents with URI symptoms/ possible sinus infection. Pt does have previous hx of having sinusitis. This was a video visit.  has had congestion runny nose nonproductive cough and right sided facial tenderness with nasal purulence.  The right side of her face hurts pretty bad and became concerned that this is a sinus infeciton. 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 Pt may have been exposed to COVID but not sure and has not tested.  Patient not had any shortness of breath or wheezing but the cough causing some back discomfort and interfering with her sleep.  Patient has had no fever but subjective chills.  Patient has been taking over-the-counter TheraFlu and Sudafed but not helping.  Patient has not done a COVID test but will try to do this at work today or tomorrow.  Patient has tried Flonase  in the past but not recently.  Patient is  allergic to amoxicillin - gets hives.  Patient thinks she needs cough medicine for night time so that she can rest.   The ASCVD Risk score (Arnett DK, et al., 2019) failed to calculate for the following reasons:   Cannot find a previous HDL lab   Cannot find a previous total cholesterol lab    ROS    Objective:    There were no vitals taken for this visit. BP Readings from Last 3 Encounters:  03/07/23 126/72  11/28/22 120/78  10/20/22 124/75   Wt Readings from Last 3 Encounters:  03/07/23 264 lb 9.6 oz (120 kg)  01/23/23 261 lb 11.2 oz (118.7 kg)  11/28/22 261 lb 6.4 oz (118.6 kg)    Physical Exam Vitals reviewed.  Constitutional:      Appearance: Normal appearance.  HENT:     Head: Normocephalic and atraumatic.     Nose: Congestion present.  Pulmonary:     Comments: Pt has dry cough during telemedicine visit.  Neurological:     General: No focal deficit present.     Mental Status: She is alert and oriented to person, place, and time.  Psychiatric:        Mood and Affect: Mood normal.        Behavior: Behavior normal.      No results found for any visits on 04/14/23.

## 2023-04-17 ENCOUNTER — Ambulatory Visit: Payer: Commercial Managed Care - PPO | Admitting: Dietician

## 2023-04-19 DIAGNOSIS — U071 COVID-19: Secondary | ICD-10-CM | POA: Insufficient documentation

## 2023-04-19 NOTE — Progress Notes (Addendum)
 Virtual Visit via Video Note  Assessment and Plan:  COVID infection URI Follow Up Instructions: No follow-ups on file.   I discussed the assessment and treatment plan with the patient. The patient was provided an opportunity to ask questions, and all were answered. The patient agreed with the plan and demonstrated an understanding of the instructions.   The patient was advised to call back or seek an in-person evaluation if the symptoms worsen or if the condition fails to improve as anticipated.  The above assessment and management plan was discussed with the patient. The patient verbalized understanding of and has agreed to the management plan.   Connie Emperor, MD  I connected with Belinda Martin on 04/19/23 at  1:30 PM EST by a video enabled telemedicine application and verified that I am speaking with the correct person using two identifiers.  Patient Location: Home Provider Location: Home Office  I discussed the limitations, risks, security, and privacy concerns of performing an evaluation and management service by video and the availability of in person appointments. I also discussed with the patient that there may be a patient responsible charge related to this service. The patient expressed understanding and agreed to proceed.  Subjective: PCP: Duanne Butler DASEN, MD  No chief complaint on file. Possible sinus infection/? COVID  Patient presents with URI symptoms/ possible sinus infection. Pt does have previous hx of having sinusitis. This was a video visit.  has had congestion runny nose nonproductive cough and right sided facial tenderness with nasal purulence.  The right side of her face hurts pretty bad and became concerned that this is a sinus infeciton. 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 Pt may have been exposed to COVID but not sure  and has not tested.  Patient not had any shortness of breath or wheezing but the cough causing some back discomfort and interfering with her sleep.  Patient has had no fever but subjective chills.  Patient has been taking over-the-counter TheraFlu and Sudafed but not helping.  Patient has not done a COVID test but will try to do this at work today or tomorrow.  Patient has tried Flonase  in the past but not recently.  Patient is allergic to amoxicillin - gets hives.  Patient thinks she needs cough medicine for night time so that she can rest.  ROS: Per HPI  Current Outpatient Medications:    azithromycin  (ZITHROMAX ) 250 MG tablet, Take 2 tablets on day 1, then 1 tablet daily on days 2 through 5, Disp: 6 tablet, Rfl: 0   benzonatate  (TESSALON  PERLES) 100 MG capsule, Take 1 capsule (100 mg total) by mouth 3 (three) times daily., Disp: 30 capsule, Rfl: 0   fluticasone  (FLONASE ) 50 MCG/ACT nasal spray, Place 2 sprays into both nostrils daily., Disp: 16 g, Rfl: 6   albuterol  (VENTOLIN  HFA) 108 (90 Base) MCG/ACT inhaler, Inhale 2 puffs into the lungs every 6 (six) hours as needed for wheezing or shortness of breath., Disp: 8 g, Rfl: 1   ascorbic acid (VITAMIN C) 500 MG tablet, Take 500 mg by mouth daily., Disp: , Rfl:    Azelaic Acid  15 % gel, After skin is thoroughly washed and patted dry, gently but thoroughly massage a thin film of azelaic acid  cream into the affected area twice daily, in the morning and evening., Disp: 50 g, Rfl: 3  Biotin 10 MG TABS, Take by mouth daily., Disp: , Rfl:    cholecalciferol (VITAMIN D3) 25 MCG (1000 UNIT) tablet, Take 1,000 Units by mouth daily., Disp: , Rfl:    clotrimazole -betamethasone  (LOTRISONE ) cream, Apply externally BID prn sx up to 2 wks, Disp: 15 g, Rfl: 0   Drospirenone  (SLYND ) 4 MG TABS, Take 1 tablet (4 mg total) by mouth daily., Disp: 84 tablet, Rfl: 3   fexofenadine (ALLEGRA) 180 MG tablet, Take 180 mg by mouth daily., Disp: , Rfl:    folic acid (FOLVITE) 1  MG tablet, Take 3 mg by mouth daily., Disp: , Rfl:    Glucosamine 500 MG CAPS, Take 1,000 mg by mouth daily., Disp: , Rfl:    HYDROcodone  bit-homatropine (HYCODAN) 5-1.5 MG/5ML syrup, Take 5 mLs by mouth every 8 (eight) hours as needed for cough., Disp: 120 mL, Rfl: 0   hydrocortisone  2.5 % cream, Apply topically 2 (two) times daily as needed (Rash). To area inner thighs and folds for up to 2 weeks. Avoid applying to face, groin, and axilla. Use as directed. Risk of skin atrophy with long-term use reviewed., Disp: 30 g, Rfl: 1   Ibuprofen-Famotidine (DUEXIS) 800-26.6 MG TABS, Take 1 tablet by mouth as needed., Disp: , Rfl:    Multiple Vitamins-Calcium (ONE-A-DAY WOMENS FORMULA PO), Take 1 tablet by mouth daily., Disp: , Rfl:    nirmatrelvir /ritonavir  (PAXLOVID ) 20 x 150 MG & 10 x 100MG  TABS, Take 3 tablets by mouth 2 (two) times daily for 5 days. (Take nirmatrelvir  150 mg two tablets twice daily for 5 days and ritonavir  100 mg one tablet twice daily for 5 days), Disp: 30 tablet, Rfl: 0   Omega-3 Fatty Acids (FISH OIL) 1000 MG CAPS, Take 2 capsules by mouth daily., Disp: , Rfl:    rizatriptan  (MAXALT -MLT) 10 MG disintegrating tablet, Take 1 tablet (10 mg total) by mouth as needed for migraine. May repeat in 2 hours if needed, Disp: 10 tablet, Rfl: 0   tiZANidine  (ZANAFLEX ) 4 MG tablet, Take 1 tablet (4 mg total) by mouth every 6 (six) hours as needed for muscle spasms., Disp: 30 tablet, Rfl: 0   tiZANidine  (ZANAFLEX ) 4 MG tablet, Take 1 tablet (4 mg total) by mouth every 6 (six) hours as needed for muscle spasms., Disp: 30 tablet, Rfl: 0   TRIAMCINOLONE  ACETONIDE, TOP, (TRIANEX ) 0.05 % OINT, Apply twice daily to hands for up to 2 weeks then weekends only. Avoid applying to face, groin, and axilla. Use as directed. Risk of skin atrophy with long-term use reviewed., Disp: 110 g, Rfl: 1   vitamin B-12 (CYANOCOBALAMIN) 500 MCG tablet, Take 1,000 mcg by mouth daily., Disp: , Rfl:    Observations/Objective: There were no vitals filed for this visit. Physical Exam Physical Exam Vitals reviewed.  Constitutional:      Appearance: Normal appearance.  HENT:     Head: Normocephalic and atraumatic.     Nose: Congestion present.  Pulmonary:     Comments: Pt has dry cough during telemedicine visit.  Neurological:     General: No focal deficit present.     Mental Status: She is alert and oriented to person, place, and time.  Psychiatric:        Mood and Affect: Mood normal.        Behavior: Behavior normal.

## 2023-05-10 ENCOUNTER — Encounter: Payer: Self-pay | Admitting: Family Medicine

## 2023-05-10 ENCOUNTER — Ambulatory Visit: Payer: Commercial Managed Care - PPO | Admitting: Family Medicine

## 2023-05-10 VITALS — BP 130/64 | HR 97 | Temp 99.0°F | Ht 67.0 in | Wt 270.0 lb

## 2023-05-10 DIAGNOSIS — K921 Melena: Secondary | ICD-10-CM | POA: Diagnosis not present

## 2023-05-10 NOTE — Progress Notes (Signed)
 Subjective:  HPI: Belinda Martin is a 48 y.o. female presenting on 05/10/2023 for Follow-up (blood in stool when pt had Covid)   HPI Patient is in today for bright red blood blood in her stool 2 weeks ago for approx 1 week. She did have covid and was treated for paxlovid  and experienced diarrhea and nausea during that time and started to notice the blood on tissue. She does have a PMH of hemorrhoids. Her stools have returned to normal and has not seen blood in past week. Denies abdominal cramping, fever, chills, body aches. She does have a colonoscopy ordered that jsut needs to be scheduled given family history of crohns.  Review of Systems  All other systems reviewed and are negative.   Relevant past medical history reviewed and updated as indicated.   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  EF 60 to 65% no RWMA.  Normal diastolic parameters.  Normal RV size function and pressures.  Normal valves.  Normal RAP.    Allergies and medications reviewed and updated.   Current Outpatient Medications:    albuterol  (VENTOLIN  HFA) 108 (90 Base)  MCG/ACT inhaler, Inhale 2 puffs into the lungs every 6 (six) hours as needed for wheezing or shortness of breath., Disp: 8 g, Rfl: 1   ascorbic acid (VITAMIN C) 500 MG tablet, Take 500 mg by mouth daily., Disp: , Rfl:    Azelaic Acid  15 % gel, After skin is thoroughly washed and patted dry, gently but thoroughly massage a thin film of azelaic acid  cream into the affected area twice daily, in the morning and evening., Disp: 50 g, Rfl: 3   Biotin 10 MG TABS, Take by mouth daily., Disp: , Rfl:    cholecalciferol (VITAMIN D3) 25 MCG (1000 UNIT) tablet, Take 1,000 Units by mouth daily., Disp: , Rfl:    clotrimazole -betamethasone  (LOTRISONE ) cream, Apply externally BID prn sx up to 2 wks, Disp: 15 g, Rfl: 0   Drospirenone  (SLYND ) 4 MG TABS, Take 1 tablet (4 mg total) by mouth daily., Disp: 84 tablet, Rfl: 3   fexofenadine (ALLEGRA) 180 MG tablet, Take 180 mg by mouth daily., Disp: , Rfl:    folic acid (FOLVITE) 1 MG tablet, Take 3 mg by mouth daily., Disp: , Rfl:    Glucosamine 500 MG CAPS, Take 1,000 mg by mouth daily., Disp: , Rfl:    hydrocortisone  2.5 % cream, Apply topically 2 (two) times daily as needed (Rash). To area  inner thighs and folds for up to 2 weeks. Avoid applying to face, groin, and axilla. Use as directed. Risk of skin atrophy with long-term use reviewed., Disp: 30 g, Rfl: 1   Ibuprofen-Famotidine (DUEXIS) 800-26.6 MG TABS, Take 1 tablet by mouth as needed., Disp: , Rfl:    Multiple Vitamins-Calcium (ONE-A-DAY WOMENS FORMULA PO), Take 1 tablet by mouth daily., Disp: , Rfl:    Omega-3 Fatty Acids (FISH OIL) 1000 MG CAPS, Take 2 capsules by mouth daily., Disp: , Rfl:    rizatriptan  (MAXALT -MLT) 10 MG disintegrating tablet, Take 1 tablet (10 mg total) by mouth as needed for migraine. May repeat in 2 hours if needed, Disp: 10 tablet, Rfl: 0   tiZANidine  (ZANAFLEX ) 4 MG tablet, Take 1 tablet (4 mg total) by mouth every 6 (six) hours as needed for muscle spasms., Disp: 30 tablet, Rfl: 0    tiZANidine  (ZANAFLEX ) 4 MG tablet, Take 1 tablet (4 mg total) by mouth every 6 (six) hours as needed for muscle spasms., Disp: 30 tablet, Rfl: 0   TRIAMCINOLONE  ACETONIDE, TOP, (TRIANEX ) 0.05 % OINT, Apply twice daily to hands for up to 2 weeks then weekends only. Avoid applying to face, groin, and axilla. Use as directed. Risk of skin atrophy with long-term use reviewed., Disp: 110 g, Rfl: 1   vitamin B-12 (CYANOCOBALAMIN) 500 MCG tablet, Take 1,000 mcg by mouth daily., Disp: , Rfl:    benzonatate  (TESSALON  PERLES) 100 MG capsule, Take 1 capsule (100 mg total) by mouth 3 (three) times daily. (Patient not taking: Reported on 05/10/2023), Disp: 30 capsule, Rfl: 0   fluticasone  (FLONASE ) 50 MCG/ACT nasal spray, Place 2 sprays into both nostrils daily. (Patient not taking: Reported on 05/10/2023), Disp: 16 g, Rfl: 6   HYDROcodone  bit-homatropine (HYCODAN) 5-1.5 MG/5ML syrup, Take 5 mLs by mouth every 8 (eight) hours as needed for cough. (Patient not taking: Reported on 05/10/2023), Disp: 120 mL, Rfl: 0  Allergies  Allergen Reactions   Amoxicillin     REACTION: hives    Objective:   BP 130/64   Pulse 97   Temp 99 F (37.2 C) (Oral)   Ht 5' 7 (1.702 m)   Wt 270 lb (122.5 kg)   SpO2 99%   BMI 42.29 kg/m      05/10/2023    3:52 PM 03/07/2023    3:49 PM 01/23/2023    3:50 PM  Vitals with BMI  Height 5' 7 5' 7 5' 7  Weight 270 lbs 264 lbs 10 oz 261 lbs 11 oz  BMI 42.28 41.43 40.98  Systolic 130 126   Diastolic 64 72   Pulse 97 83      Physical Exam Vitals and nursing note reviewed.  Constitutional:      Appearance: Normal appearance. She is obese.  HENT:     Head: Normocephalic and atraumatic.  Abdominal:     General: Bowel sounds are normal. There is no distension.     Palpations: Abdomen is soft.     Tenderness: There is no abdominal tenderness.  Skin:    General: Skin is warm and dry.  Neurological:     General: No focal deficit present.     Mental Status: She is alert and  oriented to person, place, and time. Mental status is at baseline.  Psychiatric:        Mood and Affect: Mood normal.        Behavior: Behavior normal.        Thought Content: Thought  content normal.        Judgment: Judgment normal.     Assessment & Plan:  Blood in stool Assessment & Plan: Pt here today with history of blood in her stool that has resolved. Otherwise asymptomatic. Stool card provided. Encouraged to schedule colonoscopy. Follow up with PCP if problem returns.       Follow up plan: Return if symptoms worsen or fail to improve.  Jeoffrey GORMAN Barrio, FNP

## 2023-05-10 NOTE — Assessment & Plan Note (Signed)
 Pt here today with history of blood in her stool that has resolved. Otherwise asymptomatic. Stool card provided. Encouraged to schedule colonoscopy. Follow up with PCP if problem returns.

## 2023-05-11 ENCOUNTER — Ambulatory Visit: Payer: Commercial Managed Care - PPO | Admitting: Family Medicine

## 2023-05-13 NOTE — Progress Notes (Addendum)
Donita Brooks, MD   Chief Complaint  Patient presents with   Menstrual Problem    ?Perimenopause    HPI:      [name] is a 48 y.o. G1P0010 whose LMP was No LMP recorded. (Menstrual status: Oral contraceptives)., presents today for questions about perimenopause due to recent hot flashes/night sweats, wt gain, vaginal and skin dryness, feels jittery with night sweats, and overall feels different. 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 Pt currently on slynd; menses had been absent but now with random bleeding/spotting with dysmen, usually not on placebo pills, for a few months. Was having heavier and longer bleeding with camila in past. Pt had normal labs with PCP 8/24.    Pt also with occas ext vaginal itching. 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 Has noticed some increased d/c, no odor. Wearing cotton underwear. Has used lotrisone crm in past for itch and that improves sx.     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  EF 60 to 65% no RWMA. " Normal diastolic parameters".  Normal RV size function and pressures.  Normal valves.  Normal RAP.     Family History  Problem Relation Age of Onset   Arrhythmia Mother    Heart murmur Mother    Endometriosis Mother    Fibromyalgia Mother    Irritable bowel syndrome Mother    Deep vein thrombosis Father        MTHFR homozygous   Atrial fibrillation Father        Relatively recent diagnosis.   Hypertension Father    Peripheral Artery Disease Father        Carotid artery stenosis-CEA   Crohn's disease Maternal Grandmother    Diabetes Maternal Grandmother    Heart failure Paternal Grandfather        CHF;   Stomach cancer Paternal Grandfather    Heart murmur Paternal Uncle    Coronary artery disease Neg Hx        premature CAD   Breast cancer Neg Hx     Social History   Socioeconomic History   Marital status: Married    Spouse name: Arlys John   Number of children: 0   Years of education: Not on file   Highest education level: Master's degree (e.g., MA, MS, MEng, MEd, MSW, MBA)  Occupational History   Occupation: [employer]    Comment: Interior and spatial designer Med  Tobacco Use   Smoking status: Never   Smokeless tobacco: Never  Vaping Use   Vaping status: Never Used  Substance and Sexual Activity  Alcohol use: Not Currently    Comment: rare   Drug use: No   Sexual activity: Yes    Partners: Male    Birth control/protection: Pill  Other Topics Concern   Not on file  Social History Narrative   Remarried; lives in [place]      She has lost about 90 pounds in the last year-dietary modification and try to increase exercise level.   Her new husband has encouraged her to increase her exercise => she walks most days.  They go hiking on the weekends.     Social Drivers of Corporate investment banker Strain: Not on file  Food Insecurity: Not on file  Transportation Needs: Not on file  Physical Activity: Inactive (05/29/2017)   Exercise Vital Sign    Days of Exercise per Week: 0 days    Minutes of Exercise per Session: 0 min  Stress: Stress Concern  Present (05/29/2017)   [employer] of Occupational Health - Occupational Stress Questionnaire    Feeling of Stress : To some extent  Social Connections: Not on file  Intimate Partner Violence: Not on file    Outpatient Medications Prior to Visit  Medication Sig Dispense Refill   albuterol (VENTOLIN HFA) 108 (90 Base) MCG/ACT inhaler Inhale 2 puffs into the lungs every 6 (six) hours as needed for wheezing or shortness of breath. 8 g 1   ascorbic acid (VITAMIN C) 500 MG tablet Take 500 mg by mouth daily.     Azelaic Acid 15 % gel After skin is thoroughly washed and patted dry, gently but thoroughly massage a thin film of azelaic acid cream into the affected area twice daily, in the morning and evening. 50 g 3   Biotin 10 MG TABS Take by mouth daily.     cholecalciferol (VITAMIN D3) 25 MCG (1000 UNIT) tablet Take 1,000 Units by mouth daily.     Drospirenone (SLYND) 4 MG TABS Take 1 tablet (4 mg total) by mouth daily. 84 tablet 3   fexofenadine (ALLEGRA) 180 MG tablet Take 180 mg by mouth daily.     folic acid (FOLVITE) 1 MG tablet Take 3 mg by mouth daily.     Glucosamine 500 MG CAPS Take 1,000 mg by mouth daily.     hydrocortisone 2.5 % cream Apply topically 2 (two) times daily as needed (Rash). To area inner thighs and folds for up to 2 weeks. Avoid applying to face, groin, and axilla. Use as directed. Risk of skin atrophy with long-term use reviewed. 30 g 1   Ibuprofen-Famotidine (DUEXIS) 800-26.6 MG TABS Take 1 tablet by mouth as needed.     Multiple Vitamins-Calcium (ONE-A-DAY WOMENS FORMULA PO) Take 1 tablet by mouth daily.     Omega-3 Fatty Acids (FISH OIL) 1000 MG CAPS Take 2 capsules by mouth daily.     rizatriptan (MAXALT-MLT) 10 MG disintegrating tablet Take 1 tablet (10 mg total) by mouth as needed for migraine. May repeat in 2 hours if needed 10 tablet 0   tiZANidine (ZANAFLEX) 4 MG tablet Take 1 tablet (4 mg total) by mouth every 6 (six) hours as needed for muscle spasms. 30  tablet 0   tiZANidine (ZANAFLEX) 4 MG tablet Take 1 tablet (4 mg total) by mouth every 6 (six) hours as needed for muscle spasms. 30 tablet 0   TRIAMCINOLONE ACETONIDE, TOP, (TRIANEX) 0.05 % OINT Apply twice daily to hands for up to 2 weeks then weekends only. Avoid applying to face, groin, and  axilla. Use as directed. Risk of skin atrophy with long-term use reviewed. 110 g 1   vitamin B-12 (CYANOCOBALAMIN) 500 MCG tablet Take 1,000 mcg by mouth daily.     clotrimazole-betamethasone (LOTRISONE) cream Apply externally BID prn sx up to 2 wks 15 g 0   benzonatate (TESSALON PERLES) 100 MG capsule Take 1 capsule (100 mg total) by mouth 3 (three) times daily. (Patient not taking: Reported on 05/15/2023) 30 capsule 0   fluticasone (FLONASE) 50 MCG/ACT nasal spray Place 2 sprays into both nostrils daily. (Patient not taking: Reported on 05/15/2023) 16 g 6   HYDROcodone bit-homatropine (HYCODAN) 5-1.5 MG/5ML syrup Take 5 mLs by mouth every 8 (eight) hours as needed for cough. (Patient not taking: Reported on 05/15/2023) 120 mL 0   No facility-administered medications prior to visit.      ROS:  Review of Systems  Constitutional:  Negative for fever.  Gastrointestinal:  Negative for blood in stool, constipation, diarrhea, nausea and vomiting.  Endocrine: Positive for heat intolerance.  Genitourinary:  Positive for vaginal bleeding and vaginal discharge. Negative for dyspareunia, dysuria, flank pain, frequency, hematuria, urgency and vaginal pain.  Musculoskeletal:  Negative for back pain.  Skin:  Negative for rash.  Neurological:  Positive for headaches.   BREAST: No symptoms   OBJECTIVE:   Vitals:  BP 137/77   Pulse 92   Ht 5\' 6"  (1.676 m)   Wt 270 lb 14.4 oz (122.9 kg)   BMI 43.72 kg/m   Physical Exam Vitals reviewed.  Constitutional:      Appearance: She is well-developed.  Pulmonary:     Effort: Pulmonary effort is normal.  Genitourinary:    General: Normal vulva.     Pubic Area:  No rash.      Labia:        Right: No rash, tenderness or lesion.        Left: No rash, tenderness or lesion.      Vagina: Normal. No vaginal discharge, erythema or tenderness.     Cervix: Normal.     Uterus: Normal. Not enlarged and not tender.      Adnexa: Right adnexa normal and left adnexa normal.       Right: No mass or tenderness.         Left: No mass or tenderness.    Musculoskeletal:        General: Normal range of motion.     Cervical back: Normal range of motion.  Skin:    General: Skin is warm and dry.  Neurological:     General: No focal deficit present.     Mental Status: She is alert and oriented to person, place, and time.  Psychiatric:        Mood and Affect: Mood normal.        Behavior: Behavior normal.        Thought Content: Thought content normal.        Judgment: Judgment normal.     Results: Results for orders placed or performed in visit on 05/15/23 (from the past 24 hours)  POCT Wet Prep with KOH     Status: Normal   Collection Time: 05/15/23  4:53 PM  Result Value Ref Range   Trichomonas, UA Negative    Clue Cells Wet Prep HPF POC neg    Epithelial Wet Prep HPF POC     Yeast Wet Prep HPF POC neg    Bacteria Wet Prep HPF POC     RBC Wet  Prep HPF POC     WBC Wet Prep HPF POC     KOH Prep POC Negative Negative     Assessment/Plan: Night sweats - Plan: TSH + free T4, CBC with Differential/Platelet, Comprehensive metabolic panel, Hemoglobin A1c; check labs. Will f/u with results. Discussed veozah for sx, on POPs currently. Reassurance re: irreg bleeding with POPs. Could try IUD but may want to still conceive. If ok with PCP, could try low ERT dose for sx and to see if helps with HA.    Thyroid disorder screening - Plan: TSH + free T4  Screening for diabetes mellitus - Plan: Hemoglobin A1c  Blood tests for routine general physical examination - Plan: TSH + free T4, CBC with Differential/Platelet, Comprehensive metabolic panel  Vaginal itching -  Plan: POCT Wet Prep with KOH, clotrimazole-betamethasone (LOTRISONE) cream; neg exam and wet prep. Rx RF lotrisone crm. See if sx improve, could be from wearing pantyliners. Can send in diflucan if sx persist.    Meds ordered this encounter  Medications   clotrimazole-betamethasone (LOTRISONE) cream    Sig: Apply externally BID prn sx up to 2 wks    Dispense:  15 g    Refill:  0    Supervising Provider:   Hildred Laser [[id number]]      Return if symptoms worsen or fail to improve.  Aalyssa Elderkin B. Ramonia Mcclaran, PA-C 05/15/2023 4:56 PM

## 2023-05-15 ENCOUNTER — Ambulatory Visit: Payer: Commercial Managed Care - PPO | Admitting: Obstetrics and Gynecology

## 2023-05-15 ENCOUNTER — Encounter: Payer: Self-pay | Admitting: Dermatology

## 2023-05-15 ENCOUNTER — Ambulatory Visit: Payer: Commercial Managed Care - PPO | Admitting: Dermatology

## 2023-05-15 ENCOUNTER — Encounter: Payer: Self-pay | Admitting: Obstetrics and Gynecology

## 2023-05-15 VITALS — BP 137/77 | HR 92 | Ht 66.0 in | Wt 270.9 lb

## 2023-05-15 DIAGNOSIS — L820 Inflamed seborrheic keratosis: Secondary | ICD-10-CM | POA: Diagnosis not present

## 2023-05-15 DIAGNOSIS — R61 Generalized hyperhidrosis: Secondary | ICD-10-CM | POA: Diagnosis not present

## 2023-05-15 DIAGNOSIS — L811 Chloasma: Secondary | ICD-10-CM | POA: Diagnosis not present

## 2023-05-15 DIAGNOSIS — Z131 Encounter for screening for diabetes mellitus: Secondary | ICD-10-CM | POA: Diagnosis not present

## 2023-05-15 DIAGNOSIS — L249 Irritant contact dermatitis, unspecified cause: Secondary | ICD-10-CM

## 2023-05-15 DIAGNOSIS — Z0000 Encounter for general adult medical examination without abnormal findings: Secondary | ICD-10-CM | POA: Diagnosis not present

## 2023-05-15 DIAGNOSIS — Z1329 Encounter for screening for other suspected endocrine disorder: Secondary | ICD-10-CM | POA: Diagnosis not present

## 2023-05-15 DIAGNOSIS — N898 Other specified noninflammatory disorders of vagina: Secondary | ICD-10-CM

## 2023-05-15 DIAGNOSIS — L309 Dermatitis, unspecified: Secondary | ICD-10-CM

## 2023-05-15 LAB — POCT WET PREP WITH KOH
Clue Cells Wet Prep HPF POC: NEGATIVE
KOH Prep POC: NEGATIVE
Trichomonas, UA: NEGATIVE
Yeast Wet Prep HPF POC: NEGATIVE

## 2023-05-15 MED ORDER — MOMETASONE FUROATE 0.1 % EX CREA: TOPICAL_CREAM | CUTANEOUS | 0 refills | Status: AC

## 2023-05-15 MED ORDER — CLOTRIMAZOLE-BETAMETHASONE 1-0.05 % EX CREA: TOPICAL_CREAM | CUTANEOUS | 0 refills | Status: DC

## 2023-05-15 MED ORDER — AZELAIC ACID 15 % EX GEL: CUTANEOUS | 3 refills | Status: DC

## 2023-05-15 NOTE — Patient Instructions (Signed)
 I value your feedback and you entrusting [place] with your care. If you get a [place] patient survey, I would appreciate you taking the time to let us know about your experience today. Thank you! ? ? ?

## 2023-05-15 NOTE — Patient Instructions (Signed)

## 2023-05-15 NOTE — Progress Notes (Signed)
 Follow-Up Visit   Subjective  Belinda Martin is a 47 y.o. female who presents for the following: reddish-pink spot at left thigh that has been bleeding and some of it has fallen off. Previously was a brown spot but changed and became irritated. No hx of skin cancer.   The patient has spots, moles and lesions to be evaluated, some may be new or changing and the patient may have concern these could be cancer.   The following portions of the chart were reviewed this encounter and updated as appropriate: medications, allergies, medical history  Review of Systems:  No other skin or systemic complaints except as noted in HPI or Assessment and Plan.  Objective  Well appearing patient in no apparent distress; mood and affect are within normal limits.   A focused examination was performed of the following areas: Left thigh  Relevant exam findings are noted in the Assessment and Plan.  left anterior upper thigh Residual erythematous stuck-on, waxy papule  Assessment & Plan        INFLAMED SEBORRHEIC KERATOSIS left anterior upper thigh Photos are from today at office visit and from patient's phone showing what it looked like when bleeding and irritated.  Symptomatic, irritating, patient would like treated.  Benign-appearing.  Call clinic for new or changing lesions.   Destruction of lesion - left anterior upper thigh  Destruction method: cryotherapy   Informed consent: discussed and consent obtained   Lesion destroyed using liquid nitrogen: Yes   Region frozen until ice ball extended beyond lesion: Yes   Outcome: patient tolerated procedure well with no complications   Post-procedure details: wound care instructions given   Additional details:  Prior to procedure, discussed risks of blister formation, small wound, skin dyspigmentation, or rare scar following cryotherapy. Recommend Vaseline ointment to treated areas while healing.  DERMATITIS   Related  Medications mometasone  (ELOCON ) 0.1 % cream Apply 1-2 times daily to hands as needed. Avoid applying to face, groin, and axilla. Use as directed. Long-term use can cause thinning of the skin. MELASMA   Related Medications Azelaic Acid  15 % gel After skin is thoroughly washed and patted dry, gently but thoroughly massage a thin film of azelaic acid  cream into the affected area twice daily, in the morning and evening.  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 Condition is bothersome/symptomatic for patient. Currently flared.   Hand Dermatitis is a chronic type of eczema that can come and go on the hands and fingers.  While there is no cure, the rash and symptoms can be managed with topical prescription medications, and for more severe cases, with systemic medications.  Recommend mild soap and routine use of moisturizing cream after handwashing.  Minimize soap/water exposure when possible.     Treatment Plan: Recommend Gentle Skin Care Start mometasone  cr 1-2 times daily to hands prn. Avoid applying to face, groin, and axilla. Use as directed. Long-term use can cause thinning of the skin.  Topical steroids (such as triamcinolone , fluocinolone, fluocinonide, mometasone , clobetasol, halobetasol, betamethasone , hydrocortisone ) can cause thinning and lightening of the skin if they are used for too long in the same area. Your physician has selected the right strength medicine for your problem and area affected on the body. Please use your medication only as directed by your physician to prevent side effects.    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 Condition is improving with treatment but not currently at goal.   Melasma is a chronic; persistent condition of hyperpigmented patches  generally on the face, worse in summer due to higher UV exposure.    Heredity; thyroid disease; sun exposure; pregnancy; birth control pills; epilepsy medication and darker skin may predispose to Melasma.   Recommendations include: - Sun avoidance and daily broad spectrum (UVA/UVB) tinted mineral sunscreen SPF 30+, with Zinc or Titanium Dioxide. - Rx topical bleaching creams (i.e. hydroquinone) is a common treatment but should not be used long term.  Hydroquinones may be mixed with retinoids; vitamin C; steroids; Kojic Acid. - Alastin A-luminate, retinoids, vitamin C, topical tranexamic acid, glycolic acid and kojic acid can be used for brightening while on break from hydroquinone - Rx Azelaic Acid  is also a treatment option that is safe for pregnancy (Category B). - OTC Heliocare can be helpful in control and prevention. - Oral Rx with Tranexamic Acid 250 mg - 650 mg po daily can be used for moderate to severe cases especially during summer (contraindications include pregnancy; lactation; hx of PE; hx of DVT; clotting disorder; heart disease; anticoagulant use and upcoming long trips)   - Chemical peels (would need multiple for best result).  - Lasers and  Microdermabrasion may also be helpful adjunct treatments.  Treatment Plan: Continue azelaic acid  twice daily Continue The Perfect A nightly.  Continue The Inkey Tranexamic Acid once daily Continue Spf daily   Return for TBSE, with Dr. Annette Barters.  Kerstin Peeling, RMA, am acting as scribe for Artemio Larry, MD .   Documentation: I have reviewed the above documentation for accuracy and completeness, and I agree with the above.  Artemio Larry, MD

## 2023-05-16 LAB — COMPREHENSIVE METABOLIC PANEL
ALT: 22 [IU]/L (ref 0–32)
AST: 17 [IU]/L (ref 0–40)
Albumin: 4.1 g/dL (ref 3.9–4.9)
Alkaline Phosphatase: 103 [IU]/L (ref 44–121)
BUN/Creatinine Ratio: 15 (ref 9–23)
BUN: 14 mg/dL (ref 6–24)
Bilirubin Total: 0.3 mg/dL (ref 0.0–1.2)
CO2: 20 mmol/L (ref 20–29)
Calcium: 9.3 mg/dL (ref 8.7–10.2)
Chloride: 104 mmol/L (ref 96–106)
Creatinine, Ser: 0.94 mg/dL (ref 0.57–1.00)
Globulin, Total: 2.4 g/dL (ref 1.5–4.5)
Glucose: 93 mg/dL (ref 70–99)
Potassium: 3.9 mmol/L (ref 3.5–5.2)
Sodium: 140 mmol/L (ref 134–144)
Total Protein: 6.5 g/dL (ref 6.0–8.5)
eGFR: 76 mL/min/{1.73_m2} (ref >59)

## 2023-05-16 LAB — CBC WITH DIFFERENTIAL/PLATELET
Basophils Absolute: 0 10*3/uL (ref 0.0–0.2)
Basos: 0 %
EOS (ABSOLUTE): 0.1 10*3/uL (ref 0.0–0.4)
Eos: 1 %
Hematocrit: 42.3 % (ref 34.0–46.6)
Hemoglobin: 14 g/dL (ref 11.1–15.9)
Immature Grans (Abs): 0 10*3/uL (ref 0.0–0.1)
Immature Granulocytes: 0 %
Lymphocytes Absolute: 3 10*3/uL (ref 0.7–3.1)
Lymphs: 33 %
MCH: 29.9 pg (ref 26.6–33.0)
MCHC: 33.1 g/dL (ref 31.5–35.7)
MCV: 90 fL (ref 79–97)
Monocytes Absolute: 0.8 10*3/uL (ref 0.1–0.9)
Monocytes: 9 %
Neutrophils Absolute: 5.2 10*3/uL (ref 1.4–7.0)
Neutrophils: 57 %
Platelets: 310 10*3/uL (ref 150–450)
RBC: 4.68 x10E6/uL (ref 3.77–5.28)
RDW: 13.5 % (ref 11.7–15.4)
WBC: 9.1 10*3/uL (ref 3.4–10.8)

## 2023-05-16 LAB — TSH+FREE T4
Free T4: 1.15 ng/dL (ref 0.82–1.77)
TSH: 2.32 u[IU]/mL (ref 0.450–4.500)

## 2023-05-16 LAB — HEMOGLOBIN A1C
Est. average glucose Bld gHb Est-mCnc: 117 mg/dL
Hgb A1c MFr Bld: 5.7 % — ABNORMAL HIGH (ref 4.8–5.6)

## 2023-05-22 ENCOUNTER — Encounter: Payer: Self-pay | Admitting: Family Medicine

## 2023-06-01 ENCOUNTER — Encounter: Payer: Self-pay | Admitting: Family Medicine

## 2023-06-02 ENCOUNTER — Telehealth: Payer: Commercial Managed Care - PPO | Admitting: Family Medicine

## 2023-06-02 DIAGNOSIS — J101 Influenza due to other identified influenza virus with other respiratory manifestations: Secondary | ICD-10-CM | POA: Diagnosis not present

## 2023-06-02 MED ORDER — HYDROCOD POLI-CHLORPHE POLI ER 10-8 MG/5ML PO SUER
5.0000 mL | Freq: Two times a day (BID) | ORAL | 0 refills | Status: AC | PRN
Start: 2023-06-02 — End: ?

## 2023-06-02 MED ORDER — OSELTAMIVIR PHOSPHATE 75 MG PO CAPS: 75.0000 mg | ORAL_CAPSULE | Freq: Two times a day (BID) | ORAL | 0 refills | Status: DC

## 2023-06-02 MED ORDER — FLUTICASONE PROPIONATE 50 MCG/ACT NA SUSP: 2.0000 | Freq: Every day | NASAL | 6 refills | Status: DC

## 2023-06-02 NOTE — Progress Notes (Signed)
 Subjective:    Patient ID: Belinda Martin, female    DOB: Nov 25, 1975, 48 y.o.   MRN: [id number]  HPI Patient is being seen today as a video visit.  She consents to be seen via video.  The call began at 4:00 and concluded at 410.  Patient is currently at home.  I am currently in the office.  Yesterday, the patient developed a cough.  She reports chest pain from coughing.  She reports back pain from coughing.  She reports posttussive emesis.  She reports runny nose, fever, chills.  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  EF 60 to 65% no RWMA. " Normal diastolic parameters".  Normal RV size function and pressures.  Normal valves.  Normal RAP.   Current Outpatient Medications on File Prior to Visit  Medication Sig Dispense Refill   albuterol (VENTOLIN HFA) 108 (90 Base) MCG/ACT inhaler Inhale 2 puffs into the lungs every 6 (six) hours as needed for wheezing or shortness of breath. 8 g 1   ascorbic acid (VITAMIN C) 500 MG tablet Take 500 mg by mouth daily.     Azelaic Acid 15 % gel After skin is thoroughly washed  and patted dry, gently but thoroughly massage a thin film of azelaic acid cream into the affected area twice daily, in the morning and evening. 50 g 3   benzonatate (TESSALON PERLES) 100 MG capsule Take 1 capsule (100 mg total) by mouth 3 (three) times daily. (Patient not taking: Reported on 05/15/2023) 30 capsule 0   Biotin 10 MG TABS Take by mouth daily.     cholecalciferol (VITAMIN D3) 25 MCG (1000 UNIT) tablet Take 1,000 Units by mouth daily.     clotrimazole-betamethasone (LOTRISONE) cream Apply externally BID prn sx up to 2 wks 15 g 0   Drospirenone (SLYND) 4 MG TABS Take 1 tablet (4 mg total) by mouth daily. 84 tablet 3   fexofenadine (ALLEGRA) 180 MG tablet Take 180 mg by mouth daily.     fluticasone (FLONASE) 50 MCG/ACT nasal spray Place 2 sprays into both nostrils daily. (Patient not taking: Reported on 05/15/2023) 16 g 6   folic acid (FOLVITE) 1 MG tablet Take 3 mg by mouth daily.     Glucosamine 500 MG CAPS Take 1,000 mg by mouth daily.     HYDROcodone bit-homatropine (HYCODAN) 5-1.5 MG/5ML syrup Take 5 mLs by mouth every 8 (eight) hours as needed for cough. (  Patient not taking: Reported on 05/15/2023) 120 mL 0   hydrocortisone 2.5 % cream Apply topically 2 (two) times daily as needed (Rash). To area inner thighs and folds for up to 2 weeks. Avoid applying to face, groin, and axilla. Use as directed. Risk of skin atrophy with long-term use reviewed. 30 g 1   Ibuprofen-Famotidine (DUEXIS) 800-26.6 MG TABS Take 1 tablet by mouth as needed.     mometasone (ELOCON) 0.1 % cream Apply 1-2 times daily to hands as needed. Avoid applying to face, groin, and axilla. Use as directed. Long-term use can cause thinning of the skin. 45 g 0   Multiple Vitamins-Calcium (ONE-A-DAY WOMENS FORMULA PO) Take 1 tablet by mouth daily.     Omega-3 Fatty Acids (FISH OIL) 1000 MG CAPS Take 2 capsules by mouth daily.     rizatriptan (MAXALT-MLT) 10 MG disintegrating tablet Take 1 tablet (10 mg total) by mouth as needed  for migraine. May repeat in 2 hours if needed 10 tablet 0   tiZANidine (ZANAFLEX) 4 MG tablet Take 1 tablet (4 mg total) by mouth every 6 (six) hours as needed for muscle spasms. 30 tablet 0   tiZANidine (ZANAFLEX) 4 MG tablet Take 1 tablet (4 mg total) by mouth every 6 (six) hours as needed for muscle spasms. 30 tablet 0   TRIAMCINOLONE ACETONIDE, TOP, (TRIANEX) 0.05 % OINT Apply twice daily to hands for up to 2 weeks then weekends only. Avoid applying to face, groin, and axilla. Use as directed. Risk of skin atrophy with long-term use reviewed. 110 g 1   vitamin B-12 (CYANOCOBALAMIN) 500 MCG tablet Take 1,000 mcg by mouth daily.     No current facility-administered medications on file prior to visit.   Allergies  Allergen Reactions   Amoxicillin     REACTION: hives      Review of Systems     Objective:   Physical Exam  none      Assessment & Plan:   Influenza A Patient has influenza A.  Begin Tamiflu 75 mg twice daily for 5 days.  Begin Tussionex 1 teaspoon every 12 hours as needed for cough.  Begin Flonase 2 sprays each nostril daily

## 2023-06-12 ENCOUNTER — Other Ambulatory Visit: Payer: Self-pay

## 2023-06-12 ENCOUNTER — Encounter: Payer: Commercial Managed Care - PPO | Attending: Family Medicine | Admitting: Dietician

## 2023-06-12 ENCOUNTER — Encounter: Payer: Self-pay | Admitting: Dietician

## 2023-06-12 NOTE — Progress Notes (Signed)
 Medical Nutrition Therapy: Visit start time: 1600  end time: 1630  Assessment:  Diagnosis: obesity Medical history changes: no changes Psychosocial issues/ stress concerns: none  Medications, supplement changes: reviewed list in medical record   Current weight: 273.7lbs Height: 5'7" BMI: 42.87  Progress and evaluation:  Has had bouts of flu and COVID in past several weeks so significantly less activity, more takeout meals including fast food She has met with GYN and is now considered to be perimenopausal; was advised to follow low carb, high protein eating pattern to promote weight loss. Requests quick snack ideas that contain protein.    Dietary Intake:  Usual eating pattern includes 3 meals and 1-2 snacks per day. Dining out frequency: 2-3 meals per week.  Breakfast: eggs with cheese, maybe 1 pc bread Snack: yogurt Lunch: low carb wrap or lite bread with lean deli meat and cheese; yogurt with fruit Snack: banana and/or protein shake Supper: often chicken + veg + small portion of rice or potatoes; occ spaghetti made with high protein pasta Snack: none Beverages: water, aiming for 1/2 of weight in water, occasional zero sugar soda  Physical activity: no regular activity in recent months due to illnesses, cold weather  Intervention:   Nutrition Care Education:  Basic nutrition: reviewed appropriate nutrient balance; general nutrition guidelines    Weight control: reviewed progress since previous visit; tracking food intake; snack options suitable for work environment; resuming physical activity; effects of hormonal changes  Other Intervention Notes: Patient is resuming low carb eating pattern after recent illnesses, and gradually increasing physical activity. Updated goals with input from patient.   Nutritional Diagnosis:  Newell-3.3 Overweight/obesity As related to history of excess calories and inadequate physical activity.  As evidenced by patient with current BMI of 42.87,  working on lifestyle changes to promote weight loss.    Education Materials given:  Print production planner Recipe for no-bake energy balls Visit summary with goals/ instructions   Learner/ who was taught:  Patient   Level of understanding: Verbalizes/ demonstrates competency  Demonstrated degree of understanding via:   Teach back Learning barriers: None  Willingness to learn/ readiness for change: Eager, change in progress  Monitoring and Evaluation:  Dietary intake, exercise, and body weight      follow up:  08/21/23 at 4:00pm

## 2023-06-12 NOTE — Patient Instructions (Signed)
 Use list of snack ideas for simple and quick, protein-rich options. Try no-bake energy balls as another snack option. Can mix 1 cup [place] vanilla yogurt with 1/3 cup peanut butter and 2 teaspoons honey and use as a dip for fruit or a few graham crackers.  Resume exercise gradually  Resume logging of food intake

## 2023-06-28 ENCOUNTER — Telehealth: Payer: Self-pay

## 2023-06-28 NOTE — Telephone Encounter (Signed)
 The patient called to schedule a colonoscopy. I informed her that a letter had been sent by [facility] regarding the procedure. I advised the patient that if she does not receive a response, she should allow 24 to 48 hours for a callback. I then transferred the call.

## 2023-06-29 ENCOUNTER — Telehealth: Payer: Self-pay

## 2023-06-29 ENCOUNTER — Telehealth: Payer: Self-pay | Admitting: *Deleted

## 2023-06-29 ENCOUNTER — Other Ambulatory Visit: Payer: Self-pay | Admitting: *Deleted

## 2023-06-29 DIAGNOSIS — Z1211 Encounter for screening for malignant neoplasm of colon: Secondary | ICD-10-CM

## 2023-06-29 MED ORDER — NA SULFATE-K SULFATE-MG SULF 17.5-3.13-1.6 GM/177ML PO SOLN
1.0000 | Freq: Once | ORAL | 0 refills | Status: AC
Start: 2023-06-29 — End: 2023-06-29

## 2023-06-29 NOTE — Telephone Encounter (Signed)
 Message left for patient to return my call.

## 2023-06-29 NOTE — Telephone Encounter (Signed)
 Gastroenterology Pre-Procedure Review  Request Date: 07/31/2023 Requesting Physician: Dr. Servando Snare  PATIENT REVIEW QUESTIONS: The patient responded to the following health history questions as indicated:    1. Are you having any GI issues? no 2. Do you have a personal history of Polyps? no 3. Do you have a family history of Colon Cancer or Polyps? no 4. Diabetes Mellitus? no 5. Joint replacements in the past 12 months?no 6. Major health problems in the past 3 months?no 7. Any artificial heart valves, MVP, or defibrillator?no    MEDICATIONS & ALLERGIES:    Patient reports the following regarding taking any anticoagulation/antiplatelet therapy:   Plavix, Coumadin, Eliquis, Xarelto, Lovenox, Pradaxa, Brilinta, or Effient? no Aspirin? no  Patient confirms/reports the following medications:  Current Outpatient Medications  Medication Sig Dispense Refill   albuterol (VENTOLIN HFA) 108 (90 Base) MCG/ACT inhaler Inhale 2 puffs into the lungs every 6 (six) hours as needed for wheezing or shortness of breath. 8 g 1   ascorbic acid (VITAMIN C) 500 MG tablet Take 500 mg by mouth daily.     Azelaic Acid 15 % gel After skin is thoroughly washed and patted dry, gently but thoroughly massage a thin film of azelaic acid cream into the affected area twice daily, in the morning and evening. 50 g 3   benzonatate (TESSALON PERLES) 100 MG capsule Take 1 capsule (100 mg total) by mouth 3 (three) times daily. (Patient not taking: Reported on 05/15/2023) 30 capsule 0   Biotin 10 MG TABS Take by mouth daily.     chlorpheniramine-HYDROcodone (TUSSIONEX) 10-8 MG/5ML Take 5 mLs by mouth every 12 (twelve) hours as needed. 120 mL 0   cholecalciferol (VITAMIN D3) 25 MCG (1000 UNIT) tablet Take 1,000 Units by mouth daily.     clotrimazole-betamethasone (LOTRISONE) cream Apply externally BID prn sx up to 2 wks 15 g 0   Drospirenone (SLYND) 4 MG TABS Take 1 tablet (4 mg total) by mouth daily. 84 tablet 3   fexofenadine  (ALLEGRA) 180 MG tablet Take 180 mg by mouth daily.     fluticasone (FLONASE) 50 MCG/ACT nasal spray Place 2 sprays into both nostrils daily. (Patient not taking: Reported on 05/15/2023) 16 g 6   fluticasone (FLONASE) 50 MCG/ACT nasal spray Place 2 sprays into both nostrils daily. 16 g 6   folic acid (FOLVITE) 1 MG tablet Take 3 mg by mouth daily.     Glucosamine 500 MG CAPS Take 1,000 mg by mouth daily.     HYDROcodone bit-homatropine (HYCODAN) 5-1.5 MG/5ML syrup Take 5 mLs by mouth every 8 (eight) hours as needed for cough. (Patient not taking: Reported on 05/15/2023) 120 mL 0   hydrocortisone 2.5 % cream Apply topically 2 (two) times daily as needed (Rash). To area inner thighs and folds for up to 2 weeks. Avoid applying to face, groin, and axilla. Use as directed. Risk of skin atrophy with long-term use reviewed. 30 g 1   Ibuprofen-Famotidine (DUEXIS) 800-26.6 MG TABS Take 1 tablet by mouth as needed.     mometasone (ELOCON) 0.1 % cream Apply 1-2 times daily to hands as needed. Avoid applying to face, groin, and axilla. Use as directed. Long-term use can cause thinning of the skin. 45 g 0   Multiple Vitamins-Calcium (ONE-A-DAY WOMENS FORMULA PO) Take 1 tablet by mouth daily.     Omega-3 Fatty Acids (FISH OIL) 1000 MG CAPS Take 2 capsules by mouth daily.     oseltamivir (TAMIFLU) 75 MG capsule Take 1 capsule (  75 mg total) by mouth 2 (two) times daily. 10 capsule 0   rizatriptan (MAXALT-MLT) 10 MG disintegrating tablet Take 1 tablet (10 mg total) by mouth as needed for migraine. May repeat in 2 hours if needed 10 tablet 0   tiZANidine (ZANAFLEX) 4 MG tablet Take 1 tablet (4 mg total) by mouth every 6 (six) hours as needed for muscle spasms. 30 tablet 0   tiZANidine (ZANAFLEX) 4 MG tablet Take 1 tablet (4 mg total) by mouth every 6 (six) hours as needed for muscle spasms. 30 tablet 0   TRIAMCINOLONE ACETONIDE, TOP, (TRIANEX) 0.05 % OINT Apply twice daily to hands for up to 2 weeks then weekends only.  Avoid applying to face, groin, and axilla. Use as directed. Risk of skin atrophy with long-term use reviewed. 110 g 1   vitamin B-12 (CYANOCOBALAMIN) 500 MCG tablet Take 1,000 mcg by mouth daily.     No current facility-administered medications for this visit.    Patient confirms/reports the following allergies:  Allergies  Allergen Reactions   Amoxicillin     REACTION: hives    No orders of the defined types were placed in this encounter.   AUTHORIZATION INFORMATION Primary Insurance: 1D#: Group #:  Secondary Insurance: 1D#: Group #:  SCHEDULE INFORMATION: Date: 07/31/2023 Time: Location:  [facility]

## 2023-06-29 NOTE — Telephone Encounter (Signed)
 Pt requesting call,back to schedule colonoscopy

## 2023-06-30 NOTE — Telephone Encounter (Signed)
 Colonoscopy schedule for 07/31/2023 with Dr Servando Snare

## 2023-07-24 ENCOUNTER — Telehealth: Payer: Self-pay | Admitting: *Deleted

## 2023-07-24 NOTE — Telephone Encounter (Signed)
 Patient called office because she had some questions. We discuss medication she can take if she have a migraine. What she can have on clear liquid diet and a few foods for low fiber diet.  Patient verbalized understanding. If she have any questions still to give me a call.

## 2023-07-31 ENCOUNTER — Encounter: Payer: Self-pay | Admitting: Gastroenterology

## 2023-07-31 ENCOUNTER — Ambulatory Visit: Admitting: Certified Registered"

## 2023-07-31 ENCOUNTER — Ambulatory Visit
Admission: RE | Admit: 2023-07-31 | Discharge: 2023-07-31 | Disposition: A | Discharge status: Home / Self Care | Attending: Gastroenterology | Admitting: Gastroenterology

## 2023-07-31 ENCOUNTER — Encounter
Admission: RE | Disposition: A | Discharge status: Home / Self Care | Payer: Self-pay | Source: Home / Self Care | Attending: Gastroenterology

## 2023-07-31 DIAGNOSIS — K635 Polyp of colon: Secondary | ICD-10-CM

## 2023-07-31 DIAGNOSIS — K640 First degree hemorrhoids: Secondary | ICD-10-CM | POA: Diagnosis not present

## 2023-07-31 DIAGNOSIS — Z1211 Encounter for screening for malignant neoplasm of colon: Secondary | ICD-10-CM

## 2023-07-31 DIAGNOSIS — D124 Benign neoplasm of descending colon: Secondary | ICD-10-CM | POA: Diagnosis not present

## 2023-07-31 HISTORY — PX: POLYPECTOMY: SHX149

## 2023-07-31 HISTORY — PX: COLONOSCOPY: SHX5424

## 2023-07-31 LAB — POCT PREGNANCY, URINE: Preg Test, Ur: NEGATIVE

## 2023-07-31 SURGERY — COLONOSCOPY
Anesthesia: General

## 2023-07-31 MED ORDER — PROPOFOL 500 MG/50ML IV EMUL
INTRAVENOUS | Status: DC | PRN
  Administered 2023-07-31: 150 ug/kg/min via INTRAVENOUS

## 2023-07-31 MED ORDER — LIDOCAINE HCL (PF) 2 % IJ SOLN
INTRAMUSCULAR | Status: AC
  Filled 2023-07-31: qty 10

## 2023-07-31 MED ORDER — ONDANSETRON HCL 4 MG/2ML IJ SOLN
INTRAMUSCULAR | Status: DC | PRN
  Administered 2023-07-31: 4 mg via INTRAVENOUS

## 2023-07-31 MED ORDER — PROPOFOL 1000 MG/100ML IV EMUL
INTRAVENOUS | Status: AC
  Filled 2023-07-31: qty 100

## 2023-07-31 MED ORDER — SODIUM CHLORIDE 0.9 % IV SOLN
INTRAVENOUS | Status: DC
  Administered 2023-07-31: 20 mL/h via INTRAVENOUS

## 2023-07-31 MED ORDER — LIDOCAINE HCL (CARDIAC) PF 100 MG/5ML IV SOSY
PREFILLED_SYRINGE | INTRAVENOUS | Status: DC | PRN
  Administered 2023-07-31: 60 mg via INTRAVENOUS

## 2023-07-31 MED ORDER — PROPOFOL 10 MG/ML IV BOLUS
INTRAVENOUS | Status: DC | PRN
  Administered 2023-07-31: 130 mg via INTRAVENOUS

## 2023-07-31 MED ORDER — PROPOFOL 1000 MG/100ML IV EMUL
INTRAVENOUS | Status: AC
Start: 2023-07-31 — End: ?
  Filled 2023-07-31: qty 100

## 2023-07-31 NOTE — Op Note (Signed)
 [facility] Gastroenterology Patient Name: Belinda Martin Procedure Date: 07/31/2023 7:20 AM MRN: [id number] Account #: [id number] Date of Birth: Sep 25, 1975 Admit Type: Outpatient Age: 48 Room: [facility] ENDO ROOM 4 Gender: Female Note Status: Finalized Instrument Name: Charlyn Cooley [id number] Procedure:             Colonoscopy Indications:           Screening for colorectal malignant neoplasm Providers:             Marnee Sink MD, MD Referring MD:          Cisco Crest. Pickard (Referring MD) Medicines:             Propofol per Anesthesia Complications:         No immediate complications. Procedure:             Pre-Anesthesia Assessment:                        - Prior to the procedure, a History and Physical was                         performed, and patient medications and allergies were                         reviewed. The patient's tolerance of previous                         anesthesia was also reviewed. The risks and benefits                         of the procedure and the sedation options and risks                         were discussed with the patient. All questions were                         answered, and informed consent was obtained. Prior                         Anticoagulants: The patient has taken no anticoagulant                         or antiplatelet agents. ASA Grade Assessment: II - A                         patient with mild systemic disease. After reviewing                         the risks and benefits, the patient was deemed in                         satisfactory condition to undergo the procedure.                        After obtaining informed consent, the colonoscope was                         passed under direct vision. Throughout the procedure,  the patient's blood pressure, pulse, and oxygen                         saturations were monitored continuously. The                         Colonoscope was introduced through  the anus and                         advanced to the the cecum, identified by appendiceal                         orifice and ileocecal valve. The colonoscopy was                         performed without difficulty. The patient tolerated                         the procedure well. The quality of the bowel                         preparation was excellent. Findings:      The perianal and digital rectal examinations were normal.      Two sessile polyps were found in the descending colon. The polyps were 3       to 4 mm in size. These polyps were removed with a cold snare. Resection       and retrieval were complete.      Non-bleeding internal hemorrhoids were found during retroflexion. The       hemorrhoids were Grade I (internal hemorrhoids that do not prolapse). Impression:            - Two 3 to 4 mm polyps in the descending colon,                         removed with a cold snare. Resected and retrieved.                        - Non-bleeding internal hemorrhoids. Recommendation:        - Discharge patient to home.                        - Resume previous diet.                        - Continue present medications.                        - Await pathology results.                        - If the pathology report reveals adenomatous tissue,                         then repeat the colonoscopy for surveillance in 7                         years. Procedure Code(s):     --- Professional ---  84696, Colonoscopy, flexible; with removal of                         tumor(s), polyp(s), or other lesion(s) by snare                         technique Diagnosis Code(s):     --- Professional ---                        Z12.11, Encounter for screening for malignant neoplasm                         of colon                        D12.4, Benign neoplasm of descending colon CPT copyright 2022 American [employer] Association. All rights reserved. The codes documented in this report are  preliminary and upon coder review may  be revised to meet current compliance requirements. Marnee Sink MD, MD 07/31/2023 8:16:11 AM This report has been signed electronically. Number of Addenda: 0 Note Initiated On: 07/31/2023 7:20 AM Scope Withdrawal Time: 0 hours 8 minutes 53 seconds  Total Procedure Duration: 0 hours 11 minutes 29 seconds  Estimated Blood Loss:  Estimated blood loss: none.      [facility]

## 2023-07-31 NOTE — Anesthesia Postprocedure Evaluation (Signed)
 Anesthesia Post Note  Patient: Belinda Martin  Procedure(s) Performed: COLONOSCOPY POLYPECTOMY, INTESTINE  Patient location during evaluation: Endoscopy Anesthesia Type: General Level of consciousness: awake and alert Pain management: pain level controlled Vital Signs Assessment: post-procedure vital signs reviewed and stable Respiratory status: spontaneous breathing, nonlabored ventilation, respiratory function stable and patient connected to nasal cannula oxygen Cardiovascular status: blood pressure returned to baseline and stable Postop Assessment: no apparent nausea or vomiting Anesthetic complications: no   No notable events documented.   Last Vitals:  Vitals:   07/31/23 0730 07/31/23 0819  BP: (!) 150/113 (!) 86/55  Pulse: (!) 101 85  Resp: 20 18  Temp: (!) 35.9 C (!) 35.9 C  SpO2: 99% 97%    Last Pain:  Vitals:   07/31/23 0819  TempSrc: Tympanic  PainSc: Asleep                 Lattie Poli

## 2023-07-31 NOTE — H&P (Signed)
 Belinda Sink, MD [facility] [address] [place], [place] [address] Phone: [phone] Fax : [phone]  Primary Care Physician:  Austine Lefort, MD Primary Gastroenterologist:  Dr. Ole Berkeley  Pre-Procedure History & Physical: HPI:  Belinda Martin is a 48 y.o. female is here for a screening colonoscopy.   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  EF 60 to 65% no RWMA. " Normal diastolic parameters".  Normal RV size function and pressures.  Normal valves.  Normal RAP.    Prior to Admission medications   Medication Sig Start Date End Date Taking? Authorizing Provider  albuterol  (VENTOLIN  HFA) 108 (90 Base) MCG/ACT inhaler Inhale 2 puffs into the lungs every 6 (six) hours as needed for wheezing or shortness of breath. 03/15/22  Yes Austine Lefort, MD  ascorbic acid (VITAMIN C) 500 MG tablet Take 500 mg by mouth daily.   Yes [provider]  Azelaic Acid  15 % gel After skin is thoroughly washed and patted dry, gently but thoroughly massage a thin film of azelaic acid  cream into the affected area twice daily, in the morning and  evening. 05/15/23  Yes Artemio Larry, MD  Biotin 10 MG TABS Take by mouth daily.   Yes [provider]  chlorpheniramine-HYDROcodone  (TUSSIONEX) 10-8 MG/5ML Take 5 mLs by mouth every 12 (twelve) hours as needed. 06/02/23  Yes Austine Lefort, MD  cholecalciferol (VITAMIN D3) 25 MCG (1000 UNIT) tablet Take 1,000 Units by mouth daily.   Yes [provider]  clotrimazole -betamethasone  (LOTRISONE ) cream Apply externally BID prn sx up to 2 wks 05/15/23  Yes Copland, Alicia B, PA-C  Drospirenone  (SLYND ) 4 MG TABS Take 1 tablet (4 mg total) by mouth daily. 09/12/22  Yes Copland, Alicia B, PA-C  fexofenadine (ALLEGRA) 180 MG tablet Take 180 mg by mouth daily.   Yes [provider]  fluticasone  (FLONASE ) 50 MCG/ACT nasal spray Place 2 sprays into both nostrils daily. 06/02/23  Yes Austine Lefort, MD  folic acid (FOLVITE) 1 MG tablet Take 3 mg by mouth daily.   Yes [provider]  Glucosamine 500 MG CAPS Take 1,000 mg by mouth daily.   Yes [provider]  hydrocortisone  2.5 % cream Apply topically 2 (two) times daily as needed (Rash). To area inner thighs and folds for up to 2 weeks. Avoid applying to face, groin, and axilla. Use as directed. Risk of skin atrophy with long-term use reviewed. 06/15/22  Yes Moye, Virginia ,  MD  Ibuprofen-Famotidine (DUEXIS) 800-26.6 MG TABS Take 1 tablet by mouth as needed.   Yes [provider]  mometasone  (ELOCON ) 0.1 % cream Apply 1-2 times daily to hands as needed. Avoid applying to face, groin, and axilla. Use as directed. Long-term use can cause thinning of the skin. 05/15/23  Yes Artemio Larry, MD  Multiple Vitamins-Calcium (ONE-A-DAY WOMENS FORMULA PO) Take 1 tablet by mouth daily.   Yes [provider]  Omega-3 Fatty Acids (FISH OIL) 1000 MG CAPS Take 2 capsules by mouth daily.   Yes [provider]  oseltamivir  (TAMIFLU ) 75 MG capsule Take 1 capsule (75 mg total) by mouth 2 (two) times daily. 06/02/23   Yes Austine Lefort, MD  rizatriptan  (MAXALT -MLT) 10 MG disintegrating tablet Take 1 tablet (10 mg total) by mouth as needed for migraine. May repeat in 2 hours if needed 03/07/23  Yes Pickard, Cisco Crest, MD  tiZANidine  (ZANAFLEX ) 4 MG tablet Take 1 tablet (4 mg total) by mouth every 6 (six) hours as needed for muscle spasms. 03/07/23  Yes Wilhemena Harbour, NP  tiZANidine  (ZANAFLEX ) 4 MG tablet Take 1 tablet (4 mg total) by mouth every 6 (six) hours as needed for muscle spasms. 03/07/23  Yes Austine Lefort, MD  TRIAMCINOLONE  ACETONIDE, TOP, (TRIANEX ) 0.05 % OINT Apply twice daily to hands for up to 2 weeks then weekends only. Avoid applying to face, groin, and axilla. Use as directed. Risk of skin atrophy with long-term use reviewed. 12/31/20  Yes Moye, Virginia , MD  vitamin B-12 (CYANOCOBALAMIN) 500 MCG tablet Take 1,000 mcg by mouth daily.   Yes [provider]  benzonatate  (TESSALON  PERLES) 100 MG capsule Take 1 capsule (100 mg total) by mouth 3 (three) times daily. Patient not taking: Reported on 05/15/2023 04/14/23   Amadeo June, MD  fluticasone  (FLONASE ) 50 MCG/ACT nasal spray Place 2 sprays into both nostrils daily. Patient not taking: Reported on 05/15/2023 04/14/23   Amadeo June, MD  HYDROcodone  bit-homatropine [facility]) 5-1.5 MG/5ML syrup Take 5 mLs by mouth every 8 (eight) hours as needed for cough. Patient not taking: Reported on 05/15/2023 04/14/23   Austine Lefort, MD    Allergies as of 06/30/2023 - Review Complete 05/15/2023  Allergen Reaction Noted   Amoxicillin      Family History  Problem Relation Age of Onset   Arrhythmia Mother    Heart murmur Mother    Endometriosis Mother    Fibromyalgia Mother    Irritable bowel syndrome Mother    Deep vein thrombosis Father        MTHFR homozygous   Atrial fibrillation Father        Relatively recent diagnosis.   Hypertension Father    Peripheral Artery Disease Father        Carotid artery stenosis-CEA    Crohn's disease Maternal Grandmother    Diabetes Maternal Grandmother    Heart failure Paternal Grandfather        CHF;   Stomach cancer Paternal Grandfather    Heart murmur Paternal Uncle    Coronary artery disease Neg Hx        premature CAD   Breast cancer Neg Hx     Social History   Socioeconomic History   Marital status: Married    Spouse name: Polly Brink   Number of children: 0   Years of education: Not on file   Highest education level: Master's degree (e.g., MA, MS, MEng, MEd, MSW, MBA)  Occupational History   Occupation:  Therapist for Mental Health    Comment: [employer]  Tobacco Use   Smoking status: Never   Smokeless tobacco: Never  Vaping Use   Vaping status: Never Used  Substance and Sexual Activity   Alcohol use: Not Currently    Comment: rare   Drug use: No   Sexual activity: Yes    Partners: Male    Birth control/protection: Pill  Other Topics Concern   Not on file  Social History Narrative   Remarried; lives in [place]      She has lost about 90 pounds in the last year-dietary modification and try to increase exercise level.   Her new husband has encouraged her to increase her exercise => she walks most days.  They go hiking on the weekends.     Social Drivers of Corporate investment banker Strain: Not on file  Food Insecurity: Not on file  Transportation Needs: Not on file  Physical Activity: Inactive (05/29/2017)   Exercise Vital Sign    Days of Exercise per Week: 0 days    Minutes of Exercise per Session: 0 min  Stress: Stress Concern Present (05/29/2017)   [employer] of Occupational Health - Occupational Stress Questionnaire    Feeling of Stress : To some extent  Social Connections: Not on file  Intimate Partner Violence: Not on file    Review of Systems: See HPI, otherwise negative ROS  Physical Exam: BP (!) 150/113   Pulse (!) 101   Temp (!) 96.6 F (35.9 C) (Temporal)   Resp 20   Ht 5\' 6"  (1.676 m)   Wt 119.4 kg    SpO2 99%   BMI 42.48 kg/m  General:   Alert,  pleasant and cooperative in NAD Head:  Normocephalic and atraumatic. Neck:  Supple; no masses or thyromegaly. Lungs:  Clear throughout to auscultation.    Heart:  Regular rate and rhythm. Abdomen:  Soft, nontender and nondistended. Normal bowel sounds, without guarding, and without rebound.   Neurologic:  Alert and  oriented x4;  grossly normal neurologically.  Impression/Plan: Belinda Martin is now here to undergo a screening colonoscopy.  Risks, benefits, and alternatives regarding colonoscopy have been reviewed with the patient.  Questions have been answered.  All parties agreeable.

## 2023-07-31 NOTE — Transfer of Care (Signed)
 Immediate Anesthesia Transfer of Care Note  Patient: Belinda Martin  Procedure(s) Performed: COLONOSCOPY POLYPECTOMY, INTESTINE  Patient Location: Endoscopy Unit  Anesthesia Type:General  Level of Consciousness: awake, drowsy, and patient cooperative  Airway & Oxygen Therapy: Patient Spontanous Breathing and Patient connected to nasal cannula oxygen  Post-op Assessment: Report given to RN, Post -op Vital signs reviewed and stable, and Patient moving all extremities X 4  Post vital signs: Reviewed and stable  Last Vitals:  Vitals Value Taken Time  BP 86/55 07/31/23 0819  Temp 35.9 C 07/31/23 0819  Pulse 89 07/31/23 0820  Resp 17 07/31/23 0820  SpO2 97 % 07/31/23 0820  Vitals shown include unfiled device data.  Last Pain:  Vitals:   07/31/23 0819  TempSrc: Tympanic  PainSc: Asleep         Complications: No notable events documented.

## 2023-07-31 NOTE — Anesthesia Preprocedure Evaluation (Signed)
 Anesthesia Evaluation  Patient identified by MRN, date of birth, ID band Patient awake  General Assessment Comment:  Hx MTFHR genetic mutation  Reviewed: Allergy & Precautions, NPO status , Patient's Chart, lab work & pertinent test results  History of Anesthesia Complications Negative for: history of anesthetic complications  Airway Mallampati: III  TM Distance: >3 FB Neck ROM: Full    Dental no notable dental hx. (+) Teeth Intact   Pulmonary asthma , neg sleep apnea, neg COPD, Patient abstained from smoking.Not current smoker   Pulmonary exam normal breath sounds clear to auscultation       Cardiovascular Exercise Tolerance: Good METS(-) hypertension(-) CAD and (-) Past MI negative cardio ROS (-) dysrhythmias  Rhythm:Regular Rate:Normal - Systolic murmurs  Hx palpitations   Neuro/Psych  PSYCHIATRIC DISORDERS  Depression    negative neurological ROS     GI/Hepatic ,GERD  ,,(+)     (-) substance abuse    Endo/Other  neg diabetes  Class 3 obesity  Renal/GU negative Renal ROS     Musculoskeletal   Abdominal  (+) + obese  Peds  Hematology   Anesthesia Other Findings Past Medical History: 06/23/2020: Abnormal mammogram of left breast No date: Asthma     Comment:  Allergic No date: DDD (degenerative disc disease), lumbar No date: Depression     Comment:  as adolescent No date: Fatigue No date: GERD (gastroesophageal reflux disease) No date: Hair loss     Comment:  excessive 2009: History of abnormal cervical Pap smear     Comment:  ASCUS with positive HRHPV; colpo:HPV effect 01/2009: History of echocardiogram     Comment:  EF 65-70%, normal valves 1998: History of genital warts No date: 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 No date: Irritable bowel syndrome No date: Obesity No date: Pseudotumor cerebri No date: Sleep disorder     Comment:  due to excessive limb movement  Reproductive/Obstetrics                              Anesthesia Physical Anesthesia Plan  ASA: 3  Anesthesia Plan: General   Post-op Pain Management: Minimal or no pain anticipated   Induction: Intravenous  PONV Risk Score and Plan: 3 and Propofol infusion, TIVA and Ondansetron  Airway Management Planned: Nasal Cannula  Additional Equipment: None  Intra-op Plan:   Post-operative Plan:   Informed Consent: I have reviewed the patients History and Physical, chart, labs and discussed the procedure including the risks, benefits and alternatives for the proposed anesthesia with the patient or authorized representative who has indicated his/her understanding and acceptance.     Dental advisory given  Plan Discussed with: CRNA and Surgeon  Anesthesia Plan Comments: (Discussed risks of anesthesia with patient, including possibility of difficulty with spontaneous ventilation under anesthesia necessitating airway intervention, PONV, and rare risks such as cardiac or respiratory or neurological events, and allergic reactions. Discussed the role of CRNA in patient's perioperative care. Patient understands.)       Anesthesia Quick Evaluation

## 2023-08-01 ENCOUNTER — Encounter: Payer: Self-pay | Admitting: Gastroenterology

## 2023-08-01 LAB — SURGICAL PATHOLOGY

## 2023-08-21 ENCOUNTER — Ambulatory Visit: Admitting: Dietician

## 2023-08-25 ENCOUNTER — Other Ambulatory Visit: Payer: Self-pay | Admitting: Obstetrics and Gynecology

## 2023-08-25 DIAGNOSIS — Z3041 Encounter for surveillance of contraceptive pills: Secondary | ICD-10-CM

## 2023-08-28 ENCOUNTER — Encounter: Payer: Self-pay | Admitting: Obstetrics and Gynecology

## 2023-08-29 ENCOUNTER — Other Ambulatory Visit: Payer: Self-pay

## 2023-08-29 DIAGNOSIS — Z3041 Encounter for surveillance of contraceptive pills: Secondary | ICD-10-CM

## 2023-08-29 MED ORDER — SLYND 4 MG PO TABS: 1.0000 | ORAL_TABLET | Freq: Every day | ORAL | 0 refills | Status: DC

## 2023-09-03 DIAGNOSIS — M7071 Other bursitis of hip, right hip: Secondary | ICD-10-CM

## 2023-09-03 DIAGNOSIS — M779 Enthesopathy, unspecified: Secondary | ICD-10-CM

## 2023-09-03 HISTORY — DX: Enthesopathy, unspecified: M77.9

## 2023-09-03 HISTORY — DX: Other bursitis of hip, right hip: M70.71

## 2023-09-13 DIAGNOSIS — M5030 Other cervical disc degeneration, unspecified cervical region: Secondary | ICD-10-CM | POA: Diagnosis not present

## 2023-09-13 DIAGNOSIS — M25512 Pain in left shoulder: Secondary | ICD-10-CM | POA: Diagnosis not present

## 2023-09-13 DIAGNOSIS — M7581 Other shoulder lesions, right shoulder: Secondary | ICD-10-CM | POA: Diagnosis not present

## 2023-09-18 ENCOUNTER — Ambulatory Visit (INDEPENDENT_AMBULATORY_CARE_PROVIDER_SITE_OTHER): Payer: Commercial Managed Care - PPO | Admitting: Obstetrics and Gynecology

## 2023-09-18 ENCOUNTER — Encounter: Payer: Self-pay | Admitting: Obstetrics and Gynecology

## 2023-09-18 VITALS — BP 110/71 | HR 64 | Ht 66.0 in | Wt 271.0 lb

## 2023-09-18 DIAGNOSIS — L659 Nonscarring hair loss, unspecified: Secondary | ICD-10-CM

## 2023-09-18 DIAGNOSIS — Z01419 Encounter for gynecological examination (general) (routine) without abnormal findings: Secondary | ICD-10-CM

## 2023-09-18 DIAGNOSIS — Z1231 Encounter for screening mammogram for malignant neoplasm of breast: Secondary | ICD-10-CM

## 2023-09-18 DIAGNOSIS — Z3041 Encounter for surveillance of contraceptive pills: Secondary | ICD-10-CM

## 2023-09-18 DIAGNOSIS — N898 Other specified noninflammatory disorders of vagina: Secondary | ICD-10-CM

## 2023-09-18 MED ORDER — CLOTRIMAZOLE-BETAMETHASONE 1-0.05 % EX CREA: TOPICAL_CREAM | CUTANEOUS | 1 refills | Status: AC

## 2023-09-18 MED ORDER — SLYND 4 MG PO TABS: 1.0000 | ORAL_TABLET | Freq: Every day | ORAL | 3 refills | Status: AC

## 2023-09-18 NOTE — Patient Instructions (Addendum)
 I value your feedback and you entrusting [place] with your care. If you get a [facility] patient survey, I would appreciate you taking the time to let us know about your experience today. Thank you!  [facility] ([place])--[phone]

## 2023-09-18 NOTE — Progress Notes (Signed)
 PCP:  Austine Lefort, MD   Chief Complaint  Patient presents with   Gynecologic Exam    Fatigue, hair loss     HPI:      Belinda Martin is a 48 y.o. G1P0010 whose LMP was No LMP recorded. (Menstrual status: Oral contraceptives)., presents today for her annual examination.  Her menses are absent with Slynd , no BTB/dysmen. Was having heavier and longer bleeding with camila  in past.   Sex activity: single partner, contraception - slynd . No pain/bleeding. Does have some dryness, hasn't tried lubricants. Not drinking enough water.  Hx of visual changes with headaches in past. Did depo in HS with wt gain and worsening migraines.   Hx of infertlility in past. Referred to MFM for MTHFR mutation, pt taking folic acid/B complex supp. Has approval from MFM to get pregnant and then they would follow her with us . Cardio eval pt for SOB after covid; cardio ok with her to conceive. She saw ortho for DJD and hip arthritis/pain. He said pain with pregnancy would have to be managed with safe pregnancy meds but nothing on MRI for why she couldn't have pregnancy.   Last Pap: 09/12/22 Results were: no abnormalities /neg HPV DNA. Hx of abn paps over 20 yrs ago.  Last mammogram: 01/24/23 Results were normal, repeat in 12 months There is no FH of breast cancer. There is no FH of ovarian cancer. The patient does self-breast exams.   Tobacco use: The patient denies current or previous tobacco use. Alcohol use: occas No drug use.  Exercise: mod active  Colonoscopy: 4/25 with Dr. Ole Berkeley with polyps, repeat due after 7 yrs.   She does get adequate calcium and Vitamin D in her diet. Labs with PCP  Still having issues with nocturia, now once nightly with no caffeine after 12 noon. Also has urgency and has to void almost hourly, occas with urge incont. Still drinking caffeine in AM but has cut out other caffeinated drinks. Pt hesitant to do OAB meds due to risk of dementia and already has FH of it.    Continues to have issues with ext vulvar itching on LT side only, particularly after exercise. Wearing cotton underwear, changing clothes quickly. Dove sens skin soap, no dryer sheets. Saw derm and treated with ketoconazole  and hydrocortisone  crm. Tried lotrisone  crm last yr with sx relief; uses occas for 2-3 days.   Still has fatigue. Sleeping about 8 1/2 hrs nightly, had neg sleep study. Falls asleep after dinner for nap sometimes. Normal labs 2/25. Having hair loss now for past few months, hair feels thinner. Taking hair supp. Has been under increased stress recently.    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   Tendonitis 09/2023   Right Shoulder  Past Surgical History:  Procedure Laterality Date   COLONOSCOPY N/A 07/31/2023   Procedure: COLONOSCOPY;  Surgeon: Marnee Sink, MD;  Location: [facility] ENDOSCOPY;  Service: Endoscopy;  Laterality: N/A;  Patient gets very nausea and vomiting after procedure   Electrocautery of condyloma accuminata  09/2007   Dr Annye Basque   NASAL SINUS SURGERY     POLYPECTOMY  07/31/2023   Procedure: POLYPECTOMY, INTESTINE;  Surgeon: Marnee Sink, MD;  Location: [facility] ENDOSCOPY;  Service: Endoscopy;;   TONSILLECTOMY     TRANSTHORACIC ECHOCARDIOGRAM  01/2009   EF 65-70%, normal valves   TRANSTHORACIC ECHOCARDIOGRAM  05/30/2022   NORMAL ECHO.  EF 60 to 65% no RWMA.   Normal diastolic parameters.  Normal RV size function and pressures.  Normal valves.  Normal RAP.    Family History  Problem Relation Age of Onset   Arrhythmia Mother    Heart murmur Mother    Endometriosis Mother    Fibromyalgia Mother    Irritable bowel syndrome Mother    Deep vein thrombosis Father        MTHFR homozygous   Atrial fibrillation Father        Relatively recent diagnosis.   Hypertension Father    Peripheral Artery Disease Father        Carotid artery stenosis-CEA   Crohn's disease Maternal Grandmother    Diabetes Maternal Grandmother    Heart failure Paternal Grandfather        CHF;   Stomach cancer Paternal Grandfather    Heart murmur Paternal Uncle    Coronary artery disease Neg Hx        premature CAD   Breast cancer Neg Hx     Social History   Socioeconomic History   Marital status: Married    Spouse name: Polly Brink   Number of children: 0   Years of education: Not on file   Highest education level: Master's degree (e.g., MA, MS, MEng, MEd, MSW, MBA)  Occupational History   Occupation: [employer] for [employer]    Comment: Interior and spatial designer Med  Tobacco Use   Smoking status: Never   Smokeless tobacco: Never  Vaping Use   Vaping status: Never Used  Substance and Sexual Activity   Alcohol use: Not Currently    Comment: rare   Drug use: No   Sexual activity: Yes    Partners: Male    Birth control/protection: Pill  Other Topics Concern   Not on file  Social History Narrative   Remarried; lives in [place]      She has lost about 90 pounds in the last year-dietary modification and try to increase exercise level.   Her new husband has encouraged her to increase her exercise => she walks most days.  They go hiking on the weekends.     Social Drivers of Corporate investment banker Strain: Not on file  Food Insecurity: Not on file  Transportation Needs: Not on file  Physical Activity: Inactive (05/29/2017)   Exercise Vital Sign    Days of  Exercise per Week: 0 days    Minutes of Exercise per Session: 0 min  Stress: Stress Concern Present (05/29/2017)   [employer] of Occupational Health - Occupational Stress Questionnaire    Feeling of Stress : To some extent  Social Connections: Not on file  Intimate Partner Violence: Not on file     Current Outpatient Medications:    albuterol  (VENTOLIN  HFA) 108 (90 Base) MCG/ACT inhaler, Inhale 2 puffs into the lungs  every 6 (six) hours as needed for wheezing or shortness of breath., Disp: 8 g, Rfl: 1   ascorbic acid (VITAMIN C) 500 MG tablet, Take 500 mg by mouth daily., Disp: , Rfl:    Azelaic Acid  15 % gel, After skin is thoroughly washed and patted dry, gently but thoroughly massage a thin film of azelaic acid  cream into the affected area twice daily, in the morning and evening., Disp: 50 g, Rfl: 3   Biotin 10 MG TABS, Take by mouth daily., Disp: , Rfl:    cholecalciferol (VITAMIN D3) 25 MCG (1000 UNIT) tablet, Take 1,000 Units by mouth daily., Disp: , Rfl:    fexofenadine (ALLEGRA) 180 MG tablet, Take 180 mg by mouth daily., Disp: , Rfl:    fluticasone  (FLONASE ) 50 MCG/ACT nasal spray, Place 2 sprays into both nostrils daily., Disp: 16 g, Rfl: 6   folic acid (FOLVITE) 1 MG tablet, Take 3 mg by mouth daily., Disp: , Rfl:    Glucosamine 500 MG CAPS, Take 1,000 mg by mouth daily., Disp: , Rfl:    hydrocortisone  2.5 % cream, Apply topically 2 (two) times daily as needed (Rash). To area inner thighs and folds for up to 2 weeks. Avoid applying to face, groin, and axilla. Use as directed. Risk of skin atrophy with long-term use reviewed., Disp: 30 g, Rfl: 1   Ibuprofen-Famotidine (DUEXIS) 800-26.6 MG TABS, Take 1 tablet by mouth as needed., Disp: , Rfl:    mometasone  (ELOCON ) 0.1 % cream, Apply 1-2 times daily to hands as needed. Avoid applying to face, groin, and axilla. Use as directed. Long-term use can cause thinning of the skin., Disp: 45 g, Rfl: 0   Multiple Vitamins-Calcium  (ONE-A-DAY WOMENS FORMULA PO), Take 1 tablet by mouth daily., Disp: , Rfl:    Omega-3 Fatty Acids (FISH OIL) 1000 MG CAPS, Take 2 capsules by mouth daily., Disp: , Rfl:    predniSONE  (DELTASONE ) 10 MG tablet, Take by mouth., Disp: , Rfl:    rizatriptan  (MAXALT -MLT) 10 MG disintegrating tablet, Take 1 tablet (10 mg total) by mouth as needed for migraine. May repeat in 2 hours if needed, Disp: 10 tablet, Rfl: 0   tiZANidine  (ZANAFLEX ) 4 MG tablet, Take 1 tablet (4 mg total) by mouth every 6 (six) hours as needed for muscle spasms., Disp: 30 tablet, Rfl: 0   TRIAMCINOLONE  ACETONIDE, TOP, (TRIANEX ) 0.05 % OINT, Apply twice daily to hands for up to 2 weeks then weekends only. Avoid applying to face, groin, and axilla. Use as directed. Risk of skin atrophy with long-term use reviewed., Disp: 110 g, Rfl: 1   vitamin B-12 (CYANOCOBALAMIN) 500 MCG tablet, Take 1,000 mcg by mouth daily., Disp: , Rfl:    clotrimazole -betamethasone  (LOTRISONE ) cream, Apply externally BID prn sx up to 2 wks, Disp: 15 g, Rfl: 1   Drospirenone  (SLYND ) 4 MG TABS, Take 1 tablet (4 mg total) by mouth daily., Disp: 84 tablet, Rfl: 3     ROS:  Review of Systems  Constitutional:  Positive for fatigue. Negative for fever and unexpected weight change.  Respiratory:  Negative for cough, shortness of breath and wheezing.   Cardiovascular:  Negative for chest pain, palpitations and leg swelling.  Gastrointestinal:  Negative for blood in stool, constipation, diarrhea, nausea and vomiting.  Endocrine: Negative for cold intolerance, heat intolerance and polyuria.  Genitourinary:  Positive for frequency and urgency. Negative for dyspareunia, dysuria, flank pain, genital sores, hematuria, menstrual problem, pelvic pain, vaginal bleeding, vaginal discharge and vaginal  pain.  Musculoskeletal:  Positive for arthralgias and back pain. Negative for joint swelling and myalgias.  Skin:  Negative for rash.  Neurological:  Negative for dizziness,  syncope, light-headedness, numbness and headaches.  Hematological:  Negative for adenopathy.  Psychiatric/Behavioral:  Negative for agitation, confusion, sleep disturbance and suicidal ideas. The patient is not nervous/anxious.   BREAST: No symptoms   Objective: BP 110/71   Pulse 64   Ht 5' 6 (1.676 m)   Wt 271 lb (122.9 kg)   BMI 43.74 kg/m    Physical Exam Constitutional:      Appearance: She is well-developed.  Genitourinary:     Vulva normal.     Genitourinary Comments: NEG EXT VAG EXAM FOR ITCHING     Right Labia: No rash, tenderness or lesions.    Left Labia: No tenderness, lesions or rash.    No vaginal discharge, erythema or tenderness.      Right Adnexa: not tender and no mass present.    Left Adnexa: not tender and no mass present.    No cervical friability or polyp.     Uterus is not enlarged or tender.  Breasts:    Right: No mass, nipple discharge, skin change or tenderness.     Left: No mass, nipple discharge, skin change or tenderness.  Neck:     Thyroid: No thyromegaly.   Cardiovascular:     Rate and Rhythm: Normal rate and regular rhythm.     Heart sounds: Normal heart sounds. No murmur heard. Pulmonary:     Effort: Pulmonary effort is normal.     Breath sounds: Normal breath sounds.  Abdominal:     Palpations: Abdomen is soft.     Tenderness: There is no abdominal tenderness. There is no guarding or rebound.   Musculoskeletal:        General: Normal range of motion.     Cervical back: Normal range of motion.  Lymphadenopathy:     Cervical: No cervical adenopathy.   Neurological:     General: No focal deficit present.     Mental Status: She is alert and oriented to person, place, and time.     Cranial Nerves: No cranial nerve deficit.   Skin:    General: Skin is warm and dry.   Psychiatric:        Mood and Affect: Mood normal.        Behavior: Behavior normal.        Thought Content: Thought content normal.        Judgment: Judgment  normal.  Vitals reviewed.     Assessment/Plan: Encounter for annual routine gynecological examination  Encounter for surveillance of contraceptive pills - Plan: Drospirenone  (SLYND ) 4 MG TABS; Rx RF eRxd.   Encounter for screening mammogram for malignant neoplasm of breast - Plan: MM 3D SCREENING MAMMOGRAM BILATERAL BREAST; pt to schedule mammo  Hair loss - Plan: Testosterone,Free and Total, Ferritin, Iron, TIBC and Ferritin Panel; check labs. Is under increased stress.   Vaginal itching - Plan: clotrimazole -betamethasone  (LOTRISONE ) cream; neg exam, occas sx. Rx RF. F/u prn.    Meds ordered this encounter  Medications   clotrimazole -betamethasone  (LOTRISONE ) cream    Sig: Apply externally BID prn sx up to 2 wks    Dispense:  15 g    Refill:  1    Supervising Provider:   ROBY, MICIA [[id number]]   Drospirenone  (SLYND ) 4 MG TABS    Sig: Take 1 tablet (4 mg total) by mouth daily.  Dispense:  84 tablet    Refill:  3    Supervising Provider:   ROBY, MICIA [[id number]]     GYN counsel breast self exam, mammography screening, family planning choices, adequate intake of calcium and vitamin D, diet and exercise     F/U  Return in about 1 year (around 09/17/2024).  Belinda Kassner B. Halil Rentz, PA-C 09/18/2023 4:46 PM

## 2023-09-20 ENCOUNTER — Ambulatory Visit: Payer: Self-pay | Admitting: Obstetrics and Gynecology

## 2023-09-20 LAB — IRON,TIBC AND FERRITIN PANEL
Ferritin: 83 ng/mL (ref 15–150)
Iron Saturation: 22 % (ref 15–55)
Iron: 66 ug/dL (ref 27–159)
Total Iron Binding Capacity: 305 ug/dL (ref 250–450)
UIBC: 239 ug/dL (ref 131–425)

## 2023-09-20 LAB — TESTOSTERONE,FREE AND TOTAL
Testosterone, Free: 0.6 pg/mL (ref 0.0–4.2)
Testosterone: <3 ng/dL — ABNORMAL LOW (ref 4–50)

## 2023-10-03 DIAGNOSIS — S46011D Strain of muscle(s) and tendon(s) of the rotator cuff of right shoulder, subsequent encounter: Secondary | ICD-10-CM | POA: Diagnosis not present

## 2023-10-09 ENCOUNTER — Encounter: Attending: Family Medicine | Admitting: Dietician

## 2023-10-09 ENCOUNTER — Encounter: Payer: Self-pay | Admitting: Dietician

## 2023-10-09 DIAGNOSIS — J45909 Unspecified asthma, uncomplicated: Secondary | ICD-10-CM

## 2023-10-09 NOTE — Patient Instructions (Signed)
 Plan to include 15-30grams of carb with each meal, especially when able to increase physical activity Continue with healthy food choices, great job! When making any substitutions, replace a food from the same food group to maintain a healthy balance.

## 2023-10-09 NOTE — Progress Notes (Signed)
 Medical Nutrition Therapy: Visit start time: 1600  end time: 1630  Assessment:  Diagnosis: obesity Medical history changes: shoulder pain due to muscle injury; bursitis in both hips Psychosocial issues/ stress concerns: none  Medications, supplement changes: no changes per patient   Current weight: 272.6lbs Height: 5'7 BMI: 42.7  Progress and evaluation:  Weight loss of about 1lb since previous visit on 06/12/23 Bilateral bursitis in hips Significant shoulder pain, muscle injury Significant fatigue, frequent napping; will be starting some hormonal topical treatment (testosterone ). 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    Dietary Intake:  Usual eating pattern includes 3 meals and 2 snacks per day. Dining out frequency: 0-1 meals per week.  Breakfast: 1 egg + 6 egg whites; 2 eggs, sourdough bread, mixed berries; 2 egg muffins Snack: zero sugar yogurt, few strawberries Lunch: chicken salad (1c) wrap; bbq chicken casserole, 1/2c green beans Snack: often 4-4:30pm string cheese + protein drink Supper: 5oz grilled pork chop, broccoli, sweet potato; chicken, mixed veg, sweet potato Snack: none Beverages: water -- trying to drink 80oz but difficult during workday due to patient load  Physical activity: PT for shoulders  Intervention:   Nutrition Care Education:  Basic nutrition: reviewed appropriate nutrient balance; appropriate meal and snack schedule; general nutrition guidelines    Weight control: reviewed progress since previous visit; benefit of including healthy carb foods in controlled amounts for energy and sustainability, advised at least 15-30g per meal; managing cravings for sweets -- low sugar, low cal options   Other Intervention Notes: Patient is following eating pattern prescribed by ob/gyn for weight loss during peri-menopause Encouraged patient to use eating pattern as a  basis and make minor modifications to improve sustainability and meet nutritional needs Updated goals with direction from patient.   Nutritional Diagnosis:  -3.3 Overweight/obesity As related to history of excess calories and inadequate physical activity.  As evidenced by patient with current BMI of 42.87, working on lifestyle changes to promote weight loss.    Education Materials given:  Visit summary with goals/ instructions   Learner/ who was taught:  Patient   Level of understanding: Verbalizes/ demonstrates competency  Demonstrated degree of understanding via:   Teach back Learning barriers: None  Willingness to learn/ readiness for change: Eager, change in progress   Monitoring and Evaluation:  Dietary intake, exercise, and body weight      follow up: 01/29/24

## 2023-10-10 DIAGNOSIS — S46011D Strain of muscle(s) and tendon(s) of the rotator cuff of right shoulder, subsequent encounter: Secondary | ICD-10-CM | POA: Diagnosis not present

## 2023-10-26 DIAGNOSIS — S46011D Strain of muscle(s) and tendon(s) of the rotator cuff of right shoulder, subsequent encounter: Secondary | ICD-10-CM | POA: Diagnosis not present

## 2023-11-02 DIAGNOSIS — S46011D Strain of muscle(s) and tendon(s) of the rotator cuff of right shoulder, subsequent encounter: Secondary | ICD-10-CM | POA: Diagnosis not present

## 2023-11-07 DIAGNOSIS — S46011D Strain of muscle(s) and tendon(s) of the rotator cuff of right shoulder, subsequent encounter: Secondary | ICD-10-CM | POA: Diagnosis not present

## 2023-12-12 ENCOUNTER — Encounter: Payer: Self-pay | Admitting: Family Medicine

## 2023-12-12 ENCOUNTER — Ambulatory Visit: Admitting: Family Medicine

## 2023-12-12 ENCOUNTER — Other Ambulatory Visit: Payer: Self-pay

## 2023-12-12 VITALS — BP 130/76 | HR 81 | Temp 98.2°F | Ht 67.0 in | Wt 272.0 lb

## 2023-12-12 DIAGNOSIS — M542 Cervicalgia: Secondary | ICD-10-CM

## 2023-12-12 MED ORDER — PREDNISONE 20 MG PO TABS: ORAL_TABLET | ORAL | 0 refills | Status: DC

## 2023-12-12 MED ORDER — FLUTICASONE PROPIONATE 50 MCG/ACT NA SUSP
2.0000 | Freq: Every day | NASAL | 6 refills | Status: AC
  Filled 2023-12-12: qty 16, 30d supply, fill #0

## 2023-12-12 NOTE — Progress Notes (Signed)
 Subjective:    Patient ID: Belinda Martin, female    DOB: 1975-06-23, 48 y.o.   MRN: [id number]  Patient has been dealing with pain in her neck and her shoulders for a few months.  She has been diagnosed with tendinitis in her rotator cuff.  She also has been diagnosed with pain and stiffness and muscle spasms in her neck and upper posterior shoulders.  She is now complaining of pain in her anterior neck directly over the cricoid cartilage.  She is also complaining of tenderness and pain in her sternum and chest wall muscles including pectoralis.  She has been going to physical therapy and receiving deep tissue massages with no improvement.  She denies any pain with forward flexion of the neck.  She is reporting some numbness and tingling radiating down both arms into both hands especially at the right.  She states that several years ago she had nerve conduction studies that showed mild carpal tunnel.  She denies any weakness in her arms.  She denies any loss of grip strength.  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 ENDOSCOPY;  Service: Endoscopy;  Laterality: N/A;  Patient  gets very nausea and vomiting after procedure   Electrocautery of condyloma accuminata  09/2007   Dr Hazel   NASAL SINUS SURGERY     POLYPECTOMY  07/31/2023   Procedure: POLYPECTOMY, INTESTINE;  Surgeon: Jinny Carmine, MD;  Location: [facility] ENDOSCOPY;  Service: Endoscopy;;   TONSILLECTOMY     TRANSTHORACIC ECHOCARDIOGRAM  01/2009   EF 65-70%, normal valves   TRANSTHORACIC ECHOCARDIOGRAM  05/30/2022   NORMAL ECHO.  EF 60 to 65% no RWMA.  Normal diastolic parameters.  Normal RV size function and pressures.  Normal valves.  Normal RAP.   Current Outpatient Medications on File Prior to Visit  Medication Sig Dispense Refill   albuterol  (VENTOLIN  HFA) 108 (90 Base) MCG/ACT inhaler Inhale 2 puffs into the lungs every 6 (six) hours as needed for wheezing or shortness of breath. 8 g 1   ascorbic acid (VITAMIN C) 500 MG tablet Take 500 mg by mouth daily.     Azelaic Acid  15 % gel After skin is thoroughly washed and patted dry, gently but thoroughly massage a thin film of azelaic acid  cream into the affected area twice daily, in the morning and evening. 50 g 3   Biotin 10 MG TABS Take by mouth daily.     cholecalciferol (VITAMIN D3) 25 MCG (1000 UNIT) tablet Take 1,000 Units by mouth daily.  clotrimazole -betamethasone  (LOTRISONE ) cream Apply externally BID prn sx up to 2 wks 15 g 1   Drospirenone  (SLYND ) 4 MG TABS Take 1 tablet (4 mg total) by mouth daily. 84 tablet 3   fexofenadine (ALLEGRA) 180 MG tablet Take 180 mg by mouth daily.     fluticasone  (FLONASE ) 50 MCG/ACT nasal spray Place 2 sprays into both nostrils daily. 16 g 6   folic acid (FOLVITE) 1 MG tablet Take 3 mg by mouth daily.     Glucosamine 500 MG CAPS Take 1,000 mg by mouth daily.     hydrocortisone  2.5 % cream Apply topically 2 (two) times daily as needed (Rash). To area inner thighs and folds for up to 2 weeks. Avoid applying to face, groin, and axilla. Use as directed. Risk of skin atrophy with long-term use reviewed. 30 g 1    Ibuprofen-Famotidine (DUEXIS) 800-26.6 MG TABS Take 1 tablet by mouth as needed.     mometasone  (ELOCON ) 0.1 % cream Apply 1-2 times daily to hands as needed. Avoid applying to face, groin, and axilla. Use as directed. Long-term use can cause thinning of the skin. 45 g 0   Multiple Vitamins-Calcium (ONE-A-DAY WOMENS FORMULA PO) Take 1 tablet by mouth daily.     Omega-3 Fatty Acids (FISH OIL) 1000 MG CAPS Take 2 capsules by mouth daily.     predniSONE  (DELTASONE ) 10 MG tablet Take by mouth.     rizatriptan  (MAXALT -MLT) 10 MG disintegrating tablet Take 1 tablet (10 mg total) by mouth as needed for migraine. May repeat in 2 hours if needed 10 tablet 0   tiZANidine  (ZANAFLEX ) 4 MG tablet Take 1 tablet (4 mg total) by mouth every 6 (six) hours as needed for muscle spasms. 30 tablet 0   TRIAMCINOLONE  ACETONIDE, TOP, (TRIANEX ) 0.05 % OINT Apply twice daily to hands for up to 2 weeks then weekends only. Avoid applying to face, groin, and axilla. Use as directed. Risk of skin atrophy with long-term use reviewed. 110 g 1   vitamin B-12 (CYANOCOBALAMIN) 500 MCG tablet Take 1,000 mcg by mouth daily.     No current facility-administered medications on file prior to visit.   Allergies  Allergen Reactions   Amoxicillin     REACTION: hives   Social History   Socioeconomic History   Marital status: Married    Spouse name: Redell   Number of children: 0   Years of education: Not on file   Highest education level: Master's degree (e.g., MA, MS, MEng, MEd, MSW, MBA)  Occupational History   Occupation: [employer] for [employer]    Comment: Interior and spatial designer [employer]  Tobacco Use   Smoking status: Never   Smokeless tobacco: Never  Vaping Use   Vaping status: Never Used  Substance and Sexual Activity   Alcohol use: Not Currently    Comment: rare   Drug use: No   Sexual activity: Yes    Partners: Male    Birth control/protection: Pill  Other Topics Concern   Not on file  Social History Narrative    Remarried; lives in [place]      She has lost about 90 pounds in the last year-dietary modification and try to increase exercise level.   Her new husband has encouraged her to increase her exercise => she walks most days.  They go hiking on the weekends.     Social Drivers of Corporate investment banker Strain: Not on file  Food Insecurity: Not on file  Transportation Needs:  Not on file  Physical Activity: Inactive (05/29/2017)   Exercise Vital Sign    Days of Exercise per Week: 0 days    Minutes of Exercise per Session: 0 min  Stress: Stress Concern Present (05/29/2017)   [employer] of Occupational Health - Occupational Stress Questionnaire    Feeling of Stress : To some extent  Social Connections: Not on file  Intimate Partner Violence: Not on file      Review of Systems     Objective:   Physical Exam Vitals reviewed.  Constitutional:      General: She is not in acute distress.    Appearance: She is well-developed. She is not ill-appearing, toxic-appearing or diaphoretic.  HENT:     Right Ear: Tympanic membrane and ear canal normal.     Left Ear: Tympanic membrane and ear canal normal.     Nose: Nose normal.     Mouth/Throat:     Mouth: Mucous membranes are moist.     Pharynx: Oropharynx is clear. No oropharyngeal exudate or posterior oropharyngeal erythema.  Eyes:     General: No visual field deficit.    Conjunctiva/sclera: Conjunctivae normal.     Pupils: Pupils are equal, round, and reactive to light.  Neck:     Vascular: No carotid bruit.   Cardiovascular:     Rate and Rhythm: Normal rate and regular rhythm.     Heart sounds: Normal heart sounds.  Pulmonary:     Effort: Pulmonary effort is normal.     Breath sounds: Normal breath sounds.  Musculoskeletal:     Cervical back: Neck supple. No erythema, tenderness or crepitus. Pain with movement and muscular tenderness present. No spinous process tenderness. Normal range of motion.  Lymphadenopathy:      Cervical: No cervical adenopathy.  Skin:    Findings: No erythema or rash.  Neurological:     Mental Status: She is alert.     Cranial Nerves: No cranial nerve deficit, dysarthria or facial asymmetry.     Sensory: No sensory deficit.     Motor: No weakness.     Coordination: Coordination normal.           Assessment & Plan:  Neck pain - Plan: CBC with Differential/Platelet, TSH, Sedimentation rate  Patient is having diffuse muscle pain in the trapezius, sternocleidomastoid muscles, cervical paraspinal muscles, and both shoulders.  She is also complaining of pain in her sternum and is now having anterior neck pain.  Recommended checking a CBC, sedimentation rate, and a TSH.  If sed rate and CBC are normal, I feel that this helps to rule out a more serious underlying pathology that would warrant a CT scan of the head and neck.  Start the patient on prednisone  taper pack.  If the pain improved dramatically with throughout the chest and neck area, consider possible cervical radiculopathy as a potential cause of her diffuse neck pain and shoulder pain especially given the numbness and tingling in her arms.  if so, I will consider obtaining an MRI of the cervical spine.  Patient states that she had an x-ray of her neck recently by her orthopedist that showed a bone spur but was otherwise normal.  MRI in 2021 showed only mild bulging disc but no significant pathology

## 2023-12-13 LAB — CBC WITH DIFFERENTIAL/PLATELET
Absolute Lymphocytes: 3959 {cells}/uL — ABNORMAL HIGH (ref 850–3900)
Absolute Monocytes: 1027 {cells}/uL — ABNORMAL HIGH (ref 200–950)
Basophils Absolute: 43 {cells}/uL (ref 0–200)
Basophils Relative: 0.4 %
Eosinophils Absolute: 86 {cells}/uL (ref 15–500)
Eosinophils Relative: 0.8 %
HCT: 41.7 % (ref 35.0–45.0)
Hemoglobin: 14 g/dL (ref 11.7–15.5)
MCH: 29.6 pg (ref 27.0–33.0)
MCHC: 33.6 g/dL (ref 32.0–36.0)
MCV: 88.2 fL (ref 80.0–100.0)
MPV: 10.1 fL (ref 7.5–12.5)
Monocytes Relative: 9.6 %
Neutro Abs: 5585 {cells}/uL (ref 1500–7800)
Neutrophils Relative %: 52.2 %
Platelets: 344 Thousand/uL (ref 140–400)
RBC: 4.73 Million/uL (ref 3.80–5.10)
RDW: 13.4 % (ref 11.0–15.0)
Total Lymphocyte: 37 %
WBC: 10.7 Thousand/uL (ref 3.8–10.8)

## 2023-12-13 LAB — TSH: TSH: 2.78 m[IU]/L

## 2023-12-13 LAB — SEDIMENTATION RATE: Sed Rate: 38 mm/h — ABNORMAL HIGH (ref 0–20)

## 2023-12-14 ENCOUNTER — Ambulatory Visit: Payer: Self-pay | Admitting: Family Medicine

## 2023-12-14 ENCOUNTER — Other Ambulatory Visit

## 2023-12-14 DIAGNOSIS — M542 Cervicalgia: Secondary | ICD-10-CM | POA: Diagnosis not present

## 2023-12-14 DIAGNOSIS — R5383 Other fatigue: Secondary | ICD-10-CM

## 2023-12-14 DIAGNOSIS — G932 Benign intracranial hypertension: Secondary | ICD-10-CM | POA: Diagnosis not present

## 2023-12-15 ENCOUNTER — Ambulatory Visit: Payer: Self-pay | Admitting: Family Medicine

## 2023-12-16 LAB — ANA, IFA COMPREHENSIVE PANEL
Anti Nuclear Antibody (ANA): POSITIVE — AB
ENA SM Ab Ser-aCnc: <1 AI
SM/RNP: <1 AI
SSA (Ro) (ENA) Antibody, IgG: <1 AI
SSB (La) (ENA) Antibody, IgG: <1 AI
Scleroderma (Scl-70) (ENA) Antibody, IgG: <1 AI
ds DNA Ab: <1 [IU]/mL

## 2023-12-16 LAB — C-REACTIVE PROTEIN: CRP: 4.5 mg/L (ref <8.0)

## 2023-12-16 LAB — ANTI-NUCLEAR AB-TITER (ANA TITER): ANA Titer 1: 1:80 {titer} — ABNORMAL HIGH

## 2023-12-16 LAB — RHEUMATOID FACTOR: Rheumatoid fact SerPl-aCnc: 10 [IU]/mL (ref <14)

## 2023-12-16 LAB — CK: Total CK: 107 U/L (ref 20–239)

## 2023-12-19 ENCOUNTER — Ambulatory Visit
Admission: EM | Admit: 2023-12-19 | Discharge: 2023-12-19 | Disposition: A | Discharge status: Home / Self Care | Attending: Emergency Medicine | Admitting: Emergency Medicine

## 2023-12-19 ENCOUNTER — Ambulatory Visit: Payer: Self-pay

## 2023-12-19 DIAGNOSIS — T148XXA Other injury of unspecified body region, initial encounter: Secondary | ICD-10-CM | POA: Diagnosis not present

## 2023-12-19 DIAGNOSIS — S51851A Open bite of right forearm, initial encounter: Secondary | ICD-10-CM | POA: Diagnosis not present

## 2023-12-19 DIAGNOSIS — W540XXA Bitten by dog, initial encounter: Secondary | ICD-10-CM

## 2023-12-19 DIAGNOSIS — N926 Irregular menstruation, unspecified: Secondary | ICD-10-CM | POA: Diagnosis not present

## 2023-12-19 DIAGNOSIS — Z23 Encounter for immunization: Secondary | ICD-10-CM

## 2023-12-19 DIAGNOSIS — Z3202 Encounter for pregnancy test, result negative: Secondary | ICD-10-CM

## 2023-12-19 LAB — POCT URINE PREGNANCY: Preg Test, Ur: NEGATIVE

## 2023-12-19 MED ORDER — SULFAMETHOXAZOLE-TRIMETHOPRIM 800-160 MG PO TABS
1.0000 | ORAL_TABLET | Freq: Two times a day (BID) | ORAL | 0 refills | Status: AC
Start: 2023-12-19 — End: 2023-12-24

## 2023-12-19 MED ORDER — RABIES VACCINE, PCEC IM SUSR
1.0000 mL | Freq: Once | INTRAMUSCULAR | Status: AC
  Administered 2023-12-19: 1 mL via INTRAMUSCULAR

## 2023-12-19 MED ORDER — RABIES IMMUNE GLOBULIN 300 UNIT/2ML IJ SOLN
20.0000 [IU]/kg | Freq: Once | INTRAMUSCULAR | Status: AC
  Administered 2023-12-19: 2400 [IU] via INTRAMUSCULAR

## 2023-12-19 MED ORDER — METRONIDAZOLE 500 MG PO TABS
500.0000 mg | ORAL_TABLET | Freq: Two times a day (BID) | ORAL | 0 refills | Status: AC
Start: 2023-12-19 — End: 2023-12-24

## 2023-12-19 NOTE — Discharge Instructions (Addendum)
 In addition to the human rabies immune globulin  (HRIG), you were given the first dose of the rabies vaccine  today (Day 0).  Please return here on the following dates to complete the rabies series: Day 3:12/22/2023 Day 7:12/26/2023 Day 14:01/02/2024    Take the Bactrim  and Flagyl  as directed.    Keep your wound clean and dry.  Wash it gently twice a day with soap and water.  Then apply an antibiotic cream and bandage.    Follow up right away if you see signs of infection, such as redness, pus-like drainage, fever, or other concerning symptoms.

## 2023-12-19 NOTE — ED Triage Notes (Signed)
 Patient to Urgent Care with complaints of an abrasion to her right forearm. Unsure if this is a scratch or a bite from the dog her husband rescued yesterday.   Stray dog. Unsure of vaccination status. Appears well taken care of.   TDAP 07/23/2021.

## 2023-12-19 NOTE — Telephone Encounter (Signed)
 FYI Only or Action Required?: FYI only for provider.  Patient was last seen in primary care on 12/12/2023 by Duanne Butler DASEN, MD.  Called Nurse Triage reporting Animal Bite.  Symptoms began today.  Interventions attempted: Other: washed with soap and water.  Symptoms are: rapidly improving.  Triage Disposition: Go to ED Now (Notify PCP)  Patient/caregiver understands and will follow disposition?: Yes   Copied from CRM [id number]. Topic: Clinical - Red Word Triage >> Dec 19, 2023  4:43 PM Debby BROCKS wrote: Red Word that prompted transfer to Nurse Triage: Found a stray dog on the road, have it their position but it scratched them and wants to know what should her next steps be. Reason for Disposition  [1] Any break in skin from BITE (e.g., cut, puncture or scratch) AND[2] PET animal (e.g., dog, cat, or ferret) at risk for RABIES (e.g., sick, stray, unprovoked bite, developing country)  Answer Assessment - Initial Assessment Questions 1. APPEARANCE What does it look like?  (e.g., abrasion, bruise, puncture)      Scratch, not sure if it is a scratch or bite from puppy teeth 2. SIZE: How big is the bite? (e.g., inches, cm; or compare to size of coin, pea, grape, ping pong ball)      1/2 inch long 3. LOCATION: Where is the bite located?      Top of right forearm 4. ONSET: When did the bite happen? (e.g., minutes, hours ago)      Thirty minutes ago 5. ANIMAL: What type of animal caused the bite? Is the injury from a bite or a claw? If the animal is a dog or a cat, ask: Was it a pet or a stray? Was it acting ill or behaving strangely?     Stray dog 6. RABIES VACCINE : For dog or cat bites, ask: Do you know if the pet is vaccinated against rabies?  (e.g., yes, no, overdue for rabies shot, unknown)     unknown 7. CIRCUMSTANCES: Tell me how this happened.      Puppy playing and jumped up on patient out of playful excitement 8. TETANUS: When was your last tetanus booster?      unknown 9. PREGNANCY: Is there any chance you are pregnant? When was your last menstrual period?    Irregular, not having periods  Protocols used: Animal Bite-A-AH

## 2023-12-19 NOTE — ED Provider Notes (Signed)
 CAY RALPH PELT    CSN: [id number] Arrival date & time: 12/19/23  1714      History   Chief Complaint Chief Complaint  Patient presents with   Abrasion    HPI Belinda Martin is a 48 y.o. female.  Patient presents with an abrasion on her right forearm that occurred today when a stray dog that she rescued jumped up on her but then scraped his teeth across her arm also.  The dog was being playful.  She is unsure whether or not the abrasion is a scratch or a bite.  She is unsure of the dog's vaccination status as it is a Engineer, maintenance.  She cleaned the wound at home.  No bleeding or purulent drainage.  No numbness or weakness.  LMP: Patient states her periods are irregular; she is on OCP but does not usually bleed during the placebo week.  The history is provided by the patient and medical records.    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   Tendonitis 09/2023   Right Shoulder    Patient Active Problem List   Diagnosis Date Noted   Screening for colon cancer 07/31/2023   Polyp of descending colon 07/31/2023   Blood in stool 05/10/2023   COVID-19 virus infection 04/19/2023   Upper respiratory infection 04/14/2023   DOE (dyspnea on exertion) 04/21/2022   Fatigue 04/21/2022   Rapid palpitations 05/11/2020   MTHFR gene mutation    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   Pseudotumor cerebri     Sleep disorder    Morbid obesity (HCC)    DDD (degenerative disc disease), lumbar    Environmental and seasonal allergies 07/21/2013   Asthma 01/15/2009    Past Surgical History:  Procedure Laterality Date   COLONOSCOPY N/A 07/31/2023   Procedure: COLONOSCOPY;  Surgeon: Jinny Carmine, MD;  Location: [facility] ENDOSCOPY;  Service: Endoscopy;  Laterality: N/A;  Patient gets very nausea and vomiting after procedure   Electrocautery of condyloma accuminata  09/2007   Dr Hazel   NASAL SINUS SURGERY     POLYPECTOMY  07/31/2023   Procedure: POLYPECTOMY, INTESTINE;  Surgeon: Jinny Carmine, MD;  Location: [facility] ENDOSCOPY;  Service: Endoscopy;;   TONSILLECTOMY     TRANSTHORACIC ECHOCARDIOGRAM  01/2009   EF 65-70%, normal valves   TRANSTHORACIC ECHOCARDIOGRAM  05/30/2022   NORMAL ECHO.  EF 60 to 65% no RWMA.  Normal diastolic parameters.  Normal RV size function and pressures.  Normal valves.  Normal RAP.    OB History     Gravida  1   Para      Term      Preterm      AB  1   Living  0      SAB  1  IAB      Ectopic      Multiple      Live Births               Home Medications    Prior to Admission medications   Medication Sig Start Date End Date Taking? Authorizing Provider  metroNIDAZOLE  (FLAGYL ) 500 MG tablet Take 1 tablet (500 mg total) by mouth 2 (two) times daily for 5 days. 12/19/23 12/24/23 Yes Corlis Burnard DEL, NP  sulfamethoxazole -trimethoprim  (BACTRIM  DS) 800-160 MG tablet Take 1 tablet by mouth 2 (two) times daily for 5 days. 12/19/23 12/24/23 Yes Corlis Burnard DEL, NP  albuterol  (VENTOLIN  HFA) 108 (90 Base) MCG/ACT inhaler Inhale 2 puffs into the lungs every 6 (six) hours as needed for wheezing or shortness of breath. 03/15/22   Duanne Butler DASEN, MD  ascorbic acid (VITAMIN C) 500 MG tablet Take 500 mg by mouth daily.    [provider]  Azelaic Acid  15 % gel After skin is thoroughly washed and patted dry, gently but thoroughly massage a thin film of azelaic  acid cream into the affected area twice daily, in the morning and evening. 05/15/23   Jackquline Sawyer, MD  Biotin 10 MG TABS Take by mouth daily.    [provider]  cholecalciferol (VITAMIN D3) 25 MCG (1000 UNIT) tablet Take 1,000 Units by mouth daily.    [provider]  clotrimazole -betamethasone  (LOTRISONE ) cream Apply externally BID prn sx up to 2 wks 09/18/23   Copland, Alicia B, PA-C  Drospirenone  (SLYND ) 4 MG TABS Take 1 tablet (4 mg total) by mouth daily. 09/18/23   Copland, Alicia B, PA-C  fexofenadine (ALLEGRA) 180 MG tablet Take 180 mg by mouth daily.    [provider]  fluticasone  (FLONASE ) 50 MCG/ACT nasal spray Place 2 sprays into both nostrils daily. 12/12/23   Duanne Butler DASEN, MD  folic acid (FOLVITE) 1 MG tablet Take 3 mg by mouth daily.    [provider]  Glucosamine 500 MG CAPS Take 1,000 mg by mouth daily.    [provider]  hydrocortisone  2.5 % cream Apply topically 2 (two) times daily as needed (Rash). To area inner thighs and folds for up to 2 weeks. Avoid applying to face, groin, and axilla. Use as directed. Risk of skin atrophy with long-term use reviewed. 06/15/22   Moye, Virginia , MD  Ibuprofen-Famotidine (DUEXIS) 800-26.6 MG TABS Take 1 tablet by mouth as needed.    [provider]  mometasone  (ELOCON ) 0.1 % cream Apply 1-2 times daily to hands as needed. Avoid applying to face, groin, and axilla. Use as directed. Long-term use can cause thinning of the skin. 05/15/23   Jackquline Sawyer, MD  Multiple Vitamins-Calcium (ONE-A-DAY WOMENS FORMULA PO) Take 1 tablet by mouth daily.    [provider]  Omega-3 Fatty Acids (FISH OIL) 1000 MG CAPS Take 2 capsules by mouth daily.    [provider]  predniSONE  (DELTASONE ) 10 MG tablet Take by mouth. Patient not taking: Reported on 12/19/2023 09/13/23   [provider]  predniSONE  (DELTASONE ) 20 MG tablet 3 tabs poqday 1-2, 2 tabs poqday 3-4, 1 tab poqday  5-6 Patient not taking: Reported on 12/19/2023 12/12/23   Duanne Butler DASEN, MD  rizatriptan  (MAXALT -MLT) 10 MG disintegrating tablet Take 1 tablet (10 mg total) by mouth as needed for migraine. May repeat in 2 hours if needed 03/07/23   Duanne Butler DASEN, MD  tiZANidine  (ZANAFLEX ) 4 MG tablet Take 1 tablet (4  mg total) by mouth every 6 (six) hours as needed for muscle spasms. 03/07/23   Duanne Butler DASEN, MD  TRIAMCINOLONE  ACETONIDE, TOP, (TRIANEX ) 0.05 % OINT Apply twice daily to hands for up to 2 weeks then weekends only. Avoid applying to face, groin, and axilla. Use as directed. Risk of skin atrophy with long-term use reviewed. 12/31/20   Moye, Virginia , MD  vitamin B-12 (CYANOCOBALAMIN) 500 MCG tablet Take 1,000 mcg by mouth daily.    [provider]    Family History Family History  Problem Relation Age of Onset   Arrhythmia Mother    Heart murmur Mother    Endometriosis Mother    Fibromyalgia Mother    Irritable bowel syndrome Mother    Deep vein thrombosis Father        MTHFR homozygous   Atrial fibrillation Father        Relatively recent diagnosis.   Hypertension Father    Peripheral Artery Disease Father        Carotid artery stenosis-CEA   Crohn's disease Maternal Grandmother    Diabetes Maternal Grandmother    Heart failure Paternal Grandfather        CHF;   Stomach cancer Paternal Grandfather    Heart murmur Paternal Uncle    Coronary artery disease Neg Hx        premature CAD   Breast cancer Neg Hx     Social History Social History   Tobacco Use   Smoking status: Never   Smokeless tobacco: Never  Vaping Use   Vaping status: Never Used  Substance Use Topics   Alcohol use: Not Currently    Comment: rare   Drug use: No     Allergies   Amoxicillin   Review of Systems Review of Systems  Constitutional:  Negative for chills and fever.  Musculoskeletal:  Negative for arthralgias and joint swelling.  Skin:  Positive for wound. Negative for color  change.  Neurological:  Negative for weakness and numbness.     Physical Exam Triage Vital Signs ED Triage Vitals  Encounter Vitals Group     BP      Girls Systolic BP Percentile      Girls Diastolic BP Percentile      Boys Systolic BP Percentile      Boys Diastolic BP Percentile      Pulse      Resp      Temp      Temp src      SpO2      Weight      Height      Head Circumference      Peak Flow      Pain Score      Pain Loc      Pain Education      Exclude from Growth Chart    No data found.  Updated Vital Signs BP 117/78   Pulse 89   Temp 98 F (36.7 C)   Resp 20   Wt 272 lb (123.4 kg)   SpO2 98%   BMI 42.60 kg/m   Visual Acuity Right Eye Distance:   Left Eye Distance:   Bilateral Distance:    Right Eye Near:   Left Eye Near:    Bilateral Near:     Physical Exam Constitutional:      General: She is not in acute distress. HENT:     Mouth/Throat:     Mouth: Mucous membranes are moist.  Cardiovascular:  Rate and Rhythm: Normal rate and regular rhythm.  Pulmonary:     Effort: Pulmonary effort is normal. No respiratory distress.  Musculoskeletal:        General: No swelling or deformity. Normal range of motion.  Skin:    General: Skin is warm and dry.     Capillary Refill: Capillary refill takes less than 2 seconds.     Findings: Lesion present. No erythema.     Comments: Abrasion on right forearm with small puncture wound. See picture.  Neurological:     General: No focal deficit present.     Mental Status: She is alert.     Sensory: No sensory deficit.     Motor: No weakness.      UC Treatments / Results  Labs (all labs ordered are listed, but only abnormal results are displayed) Labs Reviewed  POCT URINE PREGNANCY - Normal    EKG   Radiology No results found.  Procedures Procedures (including critical care time)  Medications Ordered in UC Medications  rabies immune globulin  (HYPERRAB) injection 2,400 Units (2,400 Units  Intramuscular Given 12/19/23 1824)  rabies vaccine  (RABAVERT ) injection 1 mL (1 mL Intramuscular Given 12/19/23 1823)    Initial Impression / Assessment and Plan / UC Course  I have reviewed the triage vital signs and the nursing notes.  Pertinent labs & imaging results that were available during my care of the patient were reviewed by me and considered in my medical decision making (see chart for details).   Abrasion due to dog bite on right forearm.  Irregular menstrual cycle, negative pregnancy test.  Patient is 123.4 kg.  Telephone consult with hospital pharmacist to verify dose of rabies immunoglobulin.  Treating today with rabies immunoglobulin and first rabies vaccine .  Rabies immunoglobulin infiltrated around the puncture wound; Remainder given IM by our nurse.  Patient is up-to-date on her tetanus.  She is allergic to amoxicillin; therefore treating her today with Bactrim  and Flagyl .  Instructed her to return here for completion of the rabies series on day 3, 7, 14.  Instructed her to follow-up with her PCP.  ED precautions given.  Education provided on animal bites, rabies infection, rabies immunoglobulin, rabies vaccines.  Patient agrees to plan of care.  Final Clinical Impressions(s) / UC Diagnoses   Final diagnoses:  Abrasion  Dog bite of right forearm, initial encounter  Irregular menstrual cycle  Negative pregnancy test     Discharge Instructions      In addition to the human rabies immune globulin  (HRIG), you were given the first dose of the rabies vaccine  today (Day 0).  Please return here on the following dates to complete the rabies series: Day 3:12/22/2023 Day 7:12/26/2023 Day 14:01/02/2024    Take the Bactrim  and Flagyl  as directed.    Keep your wound clean and dry.  Wash it gently twice a day with soap and water.  Then apply an antibiotic cream and bandage.    Follow up right away if you see signs of infection, such as redness, pus-like drainage, fever, or other  concerning symptoms.        ED Prescriptions     Medication Sig Dispense Auth. Provider   sulfamethoxazole -trimethoprim  (BACTRIM  DS) 800-160 MG tablet Take 1 tablet by mouth 2 (two) times daily for 5 days. 10 tablet Corlis Burnard DEL, NP   metroNIDAZOLE  (FLAGYL ) 500 MG tablet Take 1 tablet (500 mg total) by mouth 2 (two) times daily for 5 days. 10 tablet Corlis Burnard DEL, NP  PDMP not reviewed this encounter.   Corlis Burnard DEL, NP 12/19/23 [phone]

## 2023-12-20 ENCOUNTER — Other Ambulatory Visit: Payer: Self-pay

## 2023-12-22 ENCOUNTER — Ambulatory Visit
Admission: EM | Admit: 2023-12-22 | Discharge: 2023-12-22 | Disposition: A | Discharge status: Home / Self Care | Attending: Emergency Medicine | Admitting: Emergency Medicine

## 2023-12-22 DIAGNOSIS — Z23 Encounter for immunization: Secondary | ICD-10-CM | POA: Diagnosis not present

## 2023-12-22 DIAGNOSIS — Z203 Contact with and (suspected) exposure to rabies: Secondary | ICD-10-CM

## 2023-12-22 MED ORDER — RABIES VACCINE, PCEC IM SUSR
1.0000 mL | Freq: Once | INTRAMUSCULAR | Status: AC
  Administered 2023-12-22: 1 mL via INTRAMUSCULAR

## 2023-12-22 NOTE — ED Triage Notes (Signed)
 Patient to Urgent Care for day 3 rabies vaccine .   Denies any other complaints. Tolerated initial vaccines well.

## 2023-12-26 ENCOUNTER — Ambulatory Visit: Payer: Commercial Managed Care - PPO | Admitting: Dermatology

## 2023-12-26 ENCOUNTER — Ambulatory Visit
Admission: EM | Admit: 2023-12-26 | Discharge: 2023-12-26 | Disposition: A | Discharge status: Home / Self Care | Attending: Emergency Medicine | Admitting: Emergency Medicine

## 2023-12-26 DIAGNOSIS — L811 Chloasma: Secondary | ICD-10-CM | POA: Diagnosis not present

## 2023-12-26 DIAGNOSIS — L578 Other skin changes due to chronic exposure to nonionizing radiation: Secondary | ICD-10-CM

## 2023-12-26 DIAGNOSIS — L249 Irritant contact dermatitis, unspecified cause: Secondary | ICD-10-CM

## 2023-12-26 DIAGNOSIS — L308 Other specified dermatitis: Secondary | ICD-10-CM | POA: Diagnosis not present

## 2023-12-26 DIAGNOSIS — Z1283 Encounter for screening for malignant neoplasm of skin: Secondary | ICD-10-CM | POA: Diagnosis not present

## 2023-12-26 DIAGNOSIS — Z23 Encounter for immunization: Secondary | ICD-10-CM

## 2023-12-26 DIAGNOSIS — D229 Melanocytic nevi, unspecified: Secondary | ICD-10-CM

## 2023-12-26 DIAGNOSIS — D239 Other benign neoplasm of skin, unspecified: Secondary | ICD-10-CM

## 2023-12-26 DIAGNOSIS — W908XXA Exposure to other nonionizing radiation, initial encounter: Secondary | ICD-10-CM

## 2023-12-26 DIAGNOSIS — D1801 Hemangioma of skin and subcutaneous tissue: Secondary | ICD-10-CM

## 2023-12-26 DIAGNOSIS — L814 Other melanin hyperpigmentation: Secondary | ICD-10-CM

## 2023-12-26 DIAGNOSIS — T1490XD Injury, unspecified, subsequent encounter: Secondary | ICD-10-CM

## 2023-12-26 DIAGNOSIS — I781 Nevus, non-neoplastic: Secondary | ICD-10-CM

## 2023-12-26 DIAGNOSIS — S91001A Unspecified open wound, right ankle, initial encounter: Secondary | ICD-10-CM | POA: Diagnosis not present

## 2023-12-26 DIAGNOSIS — L739 Follicular disorder, unspecified: Secondary | ICD-10-CM

## 2023-12-26 DIAGNOSIS — L821 Other seborrheic keratosis: Secondary | ICD-10-CM

## 2023-12-26 DIAGNOSIS — D2371 Other benign neoplasm of skin of right lower limb, including hip: Secondary | ICD-10-CM

## 2023-12-26 MED ORDER — MUPIROCIN 2 % EX OINT: 1.0000 | TOPICAL_OINTMENT | Freq: Two times a day (BID) | CUTANEOUS | 0 refills | Status: AC

## 2023-12-26 MED ORDER — AZELAIC ACID 15 % EX GEL: CUTANEOUS | 3 refills | Status: AC

## 2023-12-26 MED ORDER — RABIES VACCINE, PCEC IM SUSR
1.0000 mL | Freq: Once | INTRAMUSCULAR | Status: AC
  Administered 2023-12-26: 1 mL via INTRAMUSCULAR

## 2023-12-26 MED ORDER — TRIAMCINOLONE ACETONIDE 0.1 % EX CREA: TOPICAL_CREAM | CUTANEOUS | 0 refills | Status: AC

## 2023-12-26 NOTE — Patient Instructions (Addendum)
 For melasma   Restart [facility] Hydroquinone 8%, Tretinoin 0.1%, Kojic acid 1%, Niacinamide 4%, Fluocinolone 0.025% cream, a pea sized amount nightly to dark spots on face for up to 2 months. This cannot be used more than 3 months due to risk of skin atrophy (thinning) and exogenous ochronosis (permanent dark spots).  Instructions for [facility] Medications  One or more of your medications was sent to the [facility] mail order compounding pharmacy. You will receive an email from them and can purchase the medicine through that link. It will then be mailed to your home at the address you confirmed. If for any reason you do not receive an email from them, please check your spam folder. If you still do not find the email, please let us  know. [facility] phone number is [phone].  While using [facility] stop Perfect A cream and Tranexamic acid   When finished with [facility] cream can restart Perfect A cream and Tranexamic Acid  Continue Azelaic Acid  twice daily  Continue Spf daily   For bumps at scalp Recommend OTC Head & Shoulders shampoo 2-3x per week, massage into scalp and let sit 3-5 minutes before rinsing.   Seborrheic Keratosis  What causes seborrheic keratoses? Seborrheic keratoses are harmless, common skin growths that first appear during adult life.  As time goes by, more growths appear.  Some people may develop a large number of them.  Seborrheic keratoses appear on both covered and uncovered body parts.  They are not caused by sunlight.  The tendency to develop seborrheic keratoses can be inherited.  They vary in color from skin-colored to gray, brown, or even black.  They can be either smooth or have a rough, warty surface.   Seborrheic keratoses are superficial and look as if they were stuck on the skin.  Under the microscope this type of keratosis looks like layers upon layers of skin.  That is why at times the top layer may seem to fall off, but  the rest of the growth remains and re-grows.    Treatment Seborrheic keratoses do not need to be treated, but can easily be removed in the office.  Seborrheic keratoses often cause symptoms when they rub on clothing or jewelry.  Lesions can be in the way of shaving.  If they become inflamed, they can cause itching, soreness, or burning.  Removal of a seborrheic keratosis can be accomplished by freezing, burning, or surgery. If any spot bleeds, scabs, or grows rapidly, please return to have it checked, as these can be an indication of a skin cancer.  Melanoma ABCDEs  Melanoma is the most dangerous type of skin cancer, and is the leading cause of death from skin disease.  You are more likely to develop melanoma if you: Have light-colored skin, light-colored eyes, or red or blond hair Spend a lot of time in the sun Tan regularly, either outdoors or in a tanning bed Have had blistering sunburns, especially during childhood Have a close family member who has had a melanoma Have atypical moles or large birthmarks  Early detection of melanoma is key since treatment is typically straightforward and cure rates are extremely high if we catch it early.   The first sign of melanoma is often a change in a mole or a new dark spot.  The ABCDE system is a way of remembering the signs of melanoma.  A for asymmetry:  The two halves do not match. B for border:  The edges of  the growth are irregular. C for color:  A mixture of colors are present instead of an even brown color. D for diameter:  Melanomas are usually (but not always) greater than 6mm - the size of a pencil eraser. E for evolution:  The spot keeps changing in size, shape, and color.  Please check your skin once per month between visits. You can use a small mirror in front and a large mirror behind you to keep an eye on the back side or your body.   If you see any new or changing lesions before your next follow-up, please call to schedule a  visit.  Please continue daily skin protection including broad spectrum sunscreen SPF 30+ to sun-exposed areas, reapplying every 2 hours as needed when you're outdoors.   Staying in the shade or wearing long sleeves, sun glasses (UVA+UVB protection) and wide brim hats (4-inch brim around the entire circumference of the hat) are also recommended for sun protection.    Due to recent changes in healthcare laws, you may see results of your pathology and/or laboratory studies on MyChart before the doctors have had a chance to review them. We understand that in some cases there may be results that are confusing or concerning to you. Please understand that not all results are received at the same time and often the doctors may need to interpret multiple results in order to provide you with the best plan of care or course of treatment. Therefore, we ask that you please give us  2 business days to thoroughly review all your results before contacting the office for clarification. Should we see a critical lab result, you will be contacted sooner.   If You Need Anything After Your Visit  If you have any questions or concerns for your doctor, please call our main line at [phone] and press option 4 to reach your doctor's medical assistant. If no one answers, please leave a voicemail as directed and we will return your call as soon as possible. Messages left after 4 pm will be answered the following business day.   You may also send us  a message via MyChart. We typically respond to MyChart messages within 1-2 business days.  For prescription refills, please ask your pharmacy to contact our office. Our fax number is [phone].  If you have an urgent issue when the clinic is closed that cannot wait until the next business day, you can page your doctor at the number below.    Please note that while we do our best to be available for urgent issues outside of office hours, we are not available 24/7.   If you  have an urgent issue and are unable to reach us , you may choose to seek medical care at your doctor's office, retail clinic, urgent care center, or emergency room.  If you have a medical emergency, please immediately call 911 or go to the emergency department.  Pager Numbers  - Dr. Hester: [phone]  - Dr. Jackquline: [phone]  - Dr. Claudene: [phone]   - Dr. Raymund: [phone]  In the event of inclement weather, please call our main line at [phone] for an update on the status of any delays or closures.  Dermatology Medication Tips: Please keep the boxes that topical medications come in in order to help keep track of the instructions about where and how to use these. Pharmacies typically print the medication instructions only on the boxes and not directly on the medication tubes.   If your medication  is too expensive, please contact our office at [phone] option 4 or send us  a message through MyChart.   We are unable to tell what your co-pay for medications will be in advance as this is different depending on your insurance coverage. However, we may be able to find a substitute medication at lower cost or fill out paperwork to get insurance to cover a needed medication.   If a prior authorization is required to get your medication covered by your insurance company, please allow us  1-2 business days to complete this process.  Drug prices often vary depending on where the prescription is filled and some pharmacies may offer cheaper prices.  The website www.goodrx.com contains coupons for medications through different pharmacies. The prices here do not account for what the cost may be with help from insurance (it may be cheaper with your insurance), but the website can give you the price if you did not use any insurance.  - You can print the associated coupon and take it with your prescription to the pharmacy.  - You may also stop by our office during regular business  hours and pick up a GoodRx coupon card.  - If you need your prescription sent electronically to a different pharmacy, notify our office through [facility] or by phone at [phone] option 4.     Si Usted Necesita Algo Despus de Su Visita  Tambin puede enviarnos un mensaje a travs de Clinical cytogeneticist. Por lo general respondemos a los mensajes de MyChart en el transcurso de 1 a 2 das hbiles.  Para renovar recetas, por favor pida a su farmacia que se ponga en contacto con nuestra oficina. Randi lakes de fax es [place] [phone].  Si tiene un asunto urgente cuando la clnica est cerrada y que no puede esperar hasta el siguiente da hbil, puede llamar/localizar a su doctor(a) al nmero que aparece a continuacin.   Por favor, tenga en cuenta que aunque hacemos todo lo posible para estar disponibles para asuntos urgentes fuera del horario de [place], no estamos disponibles las 24 horas del da, los 7 [address]  [address] de la [place].   Si tiene un problema urgente y no puede comunicarse con nosotros, puede optar por buscar atencin mdica  en el consultorio de su doctor(a), en una clnica privada, en un centro de atencin urgente o en una sala de emergencias.  Si tiene Engineer, drilling, por favor llame inmediatamente al 911 o vaya a la sala de emergencias.  Nmeros de bper  - Dr. Hester: [phone]  - Dra. Jackquline: [phone]  - Dr. Claudene: [phone]  - Dra. Kitts: [phone]  En caso de inclemencias del [place], por favor llame a nuestra lnea principal al [phone] para una actualizacin sobre el estado de cualquier retraso o cierre.  Consejos para la medicacin en dermatologa: Por favor, guarde las cajas en las que vienen los medicamentos de uso tpico para ayudarle a seguir las instrucciones sobre dnde y cmo usarlos. Las farmacias generalmente imprimen las instrucciones del medicamento slo en las cajas y no directamente en los tubos del [place].   Si su  medicamento es muy caro, por favor, pngase en contacto con landry rieger llamando al [phone] y presione la opcin 4 o envenos un mensaje a travs de Clinical cytogeneticist.   No podemos decirle cul ser su copago por los medicamentos por adelantado ya que esto es diferente dependiendo de la cobertura de su seguro. Sin embargo, es posible que podamos encontrar un medicamento sustituto a Engineering geologist o  llenar un formulario para que el seguro cubra el medicamento que se considera necesario.   Si se requiere una autorizacin previa para que su compaa de seguros malta su medicamento, por favor permtanos de 1 a 2 das hbiles para completar este proceso.  Los precios de los medicamentos varan con frecuencia dependiendo del Environmental consultant de dnde se surte la receta y alguna farmacias pueden ofrecer precios ms baratos.  El sitio web www.goodrx.com tiene cupones para medicamentos de Health and safety inspector. Los precios aqu no tienen en cuenta lo que podra costar con la ayuda del seguro (puede ser ms barato con su seguro), pero el sitio web puede darle el precio si no utiliz Tourist information centre manager.  - Puede imprimir el cupn correspondiente y llevarlo con su receta a la farmacia.  - Tambin puede pasar por nuestra oficina durante el horario de atencin regular y Education officer, museum una tarjeta de cupones de GoodRx.  - Si necesita que su receta se enve electrnicamente a una farmacia diferente, informe a nuestra oficina a travs de MyChart de  o por telfono llamando al [phone] y presione la opcin 4.

## 2023-12-26 NOTE — ED Triage Notes (Signed)
 Patient to urgent care for day 7 rabies vaccine .  Denies any other complaint. Tolerated initial vaccines well.

## 2023-12-26 NOTE — Progress Notes (Signed)
 Follow-Up Visit   Subjective  Belinda Martin is a 48 y.o. female who presents for the following: Skin Cancer Screening and Full Body Skin Exam Hx of isks, hx of melasma, hx of hand dermatitis  Patient reports a spot at left wrist and a wound at right ankle she injured a week ago by scraping on a piece of metal. Had tetnus shot 2023 Spot at left wrist  Scraped by piece of metal at right ankle area a week ago, had tetnus shot in 2023  Still red and itchy   The patient presents for Total-Body Skin Exam (TBSE) for skin cancer screening and mole check. The patient has spots, moles and lesions to be evaluated, some may be new or changing and the patient may have concern these could be cancer.    The following portions of the chart were reviewed this encounter and updated as appropriate: medications, allergies, medical history  Review of Systems:  No other skin or systemic complaints except as noted in HPI or Assessment and Plan.  Objective  Well appearing patient in no apparent distress; mood and affect are within normal limits.  A full examination was performed including scalp, head, eyes, ears, nose, lips, neck, chest, axillae, abdomen, back, buttocks, bilateral upper extremities, bilateral lower extremities, hands, feet, fingers, toes, fingernails, and toenails. All findings within normal limits unless otherwise noted below.   Relevant physical exam findings are noted in the Assessment and Plan.    Assessment & Plan   SKIN CANCER SCREENING PERFORMED TODAY.  ACTINIC DAMAGE - Chronic condition, secondary to cumulative UV/sun exposure - diffuse scaly erythematous macules with underlying dyspigmentation - Recommend daily broad spectrum sunscreen SPF 30+ to sun-exposed areas, reapply every 2 hours as needed.  - Staying in the shade or wearing long sleeves, sun glasses (UVA+UVB protection) and wide brim hats (4-inch brim around the entire circumference of the hat) are also recommended  for sun protection.  - Call for new or changing lesions.  LENTIGINES, SEBORRHEIC KERATOSES, HEMANGIOMAS - Benign normal skin lesions - Benign-appearing - Call for any changes - Sk at left wrist   MELANOCYTIC NEVI - Tan-brown and/or pink-flesh-colored symmetric macules and papules - Benign appearing on exam today - Observation - Call clinic for new or changing moles - Recommend daily use of broad spectrum spf 30+ sunscreen to sun-exposed areas.  DERMATOFIBROMA Exam: Firm pink/brown papulenodule with dimple sign at right lower leg   Treatment Plan: A dermatofibroma is a benign growth possibly related to trauma, such as an insect bite, cut from shaving, or inflamed acne-type bump.  Treatment options to remove include shave or excision with resulting scar and risk of recurrence.  Since benign-appearing and not bothersome, will observe for now. ,   Varicose Veins/Spider Veins - Dilated blue, purple or red veins at the lower extremities - Reassured - Smaller vessels can be treated by sclerotherapy (a procedure to inject a medicine into the veins to make them disappear) if desired, but the treatment is not covered by insurance. Larger vessels may be covered if symptomatic and we would refer to vascular surgeon if treatment desired.   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 Condition is improving with treatment but not currently at goal.    Hand Dermatitis is a chronic type of eczema that can come and go on the hands and fingers.  While there is no cure,  the rash and symptoms can be managed with topical prescription medications, and for more severe cases, with systemic medications.  Recommend mild soap and routine use of moisturizing cream after handwashing.  Minimize soap/water exposure when possible.      Treatment Plan: Continue mometasone  cream every day/bid to aas hands prn  flares Recommend Gentle Skin Care Using otc La Roche Posay Cicloplast     MELASMA Exam: reticulated hyperpigmented patches at left > right cheek, b/l extensor forearms     Chronic and persistent condition with duration or expected duration over one year. Condition is symptomatic/ bothersome to patient. Not currently at goal.    Melasma is a chronic; persistent condition of hyperpigmented patches generally on the face, worse in summer due to higher UV exposure.    Heredity; thyroid disease; sun exposure; pregnancy; birth control pills; epilepsy medication and darker skin may predispose to Melasma.   Recommendations include: - Sun avoidance and daily broad spectrum (UVA/UVB) tinted mineral sunscreen SPF 30+, with Zinc or Titanium Dioxide. - Rx topical bleaching creams (i.e. hydroquinone) is a common treatment but should not be used long term.  Hydroquinones may be mixed with retinoids; vitamin C; steroids; Kojic Acid. - Alastin A-luminate, retinoids, vitamin C, topical tranexamic acid, glycolic acid and kojic acid can be used for brightening while on break from hydroquinone - Rx Azelaic Acid  is also a treatment option that is safe for pregnancy (Category B). - OTC Heliocare can be helpful in control and prevention. - Oral Rx with Tranexamic Acid 250 mg - 650 mg po daily can be used for moderate to severe cases especially during summer (contraindications include pregnancy; lactation; hx of PE; hx of DVT; clotting disorder; heart disease; anticoagulant use and upcoming long trips)   - Chemical peels (would need multiple for best result).  - Lasers and  Microdermabrasion may also be helpful adjunct treatments.   Treatment Plan: Discussed chronic condition, commonly flares in summer  Recommend restarting Skin Medicinals Hydroquinone 8%, Tretinoin 0.1%, Kojic acid 1%, Niacinamide 4%, Fluocinolone 0.025% cream, a pea sized amount nightly to dark spots on face for up to 2 months. This cannot be used  more than 3 months due to risk of skin atrophy (thinning) and exogenous ochronosis (permanent dark spots). The patient was advised this is not covered by insurance. They will receive an email to check out and the medication will be mailed to their home.   Account under patient's maiden name Quin  Discussed while on skin medicinals cream to stop Perfect A and Tranexamic Acid, will restart after two month course of SM cream  Continue azelaic acid  15% gel once daily to aas face  Continue The Perfect A nightly (while not using Skin Medicinals Melasma cream)  Continue The Inkey Tranexamic Acid once daily - (while not using Skin Medicinals Melasma Cream)  Continue Spf daily (Alastin SPF)   Healing Wound at Right Ankle  Exam: crusted pink papule at right ankle, pt c/o itching  Treatment Plan: No signs of infection Start mupirocin  2 % ointment - apply topically to aa bid and cover until healed.  Start TMC 0.1 % cream - apply topically qd/bid prn itch. 30 g 0 rfs   Topical steroids (such as triamcinolone , fluocinolone, fluocinonide, mometasone , clobetasol, halobetasol, betamethasone , hydrocortisone ) can cause thinning and lightening of the skin if they are used for too long in the same area. Your physician has selected the right strength medicine for your problem and area affected on the body. Please use your  medication only as directed by your physician to prevent side effects.   FOLLICULITIS (SCALP) Exam: few perifollicular erythematous papules at occipital scalp  Folliculitis occurs due to inflammation of the superficial hair follicle (pore), resulting in acne-like lesions (pus bumps). It can be infectious (bacterial, fungal) or noninfectious (shaving, tight clothing, heat/sweat, medications).  Folliculitis can be acute or chronic and recommended treatment depends on the underlying cause of folliculitis.  Treatment Plan: Recommend OTC Head & Shoulders shampoo 2-3x per week, massage into  scalp and let sit 3-5 minutes before rinsing.  EXCORIATION   MELASMA   Related Medications Azelaic Acid  15 % gel After skin is thoroughly washed and patted dry, gently but thoroughly massage a thin film of azelaic acid  cream into the affected area twice daily, in the morning and evening. HEALING WOUND   Related Medications mupirocin  ointment (BACTROBAN ) 2 % Apply 1 Application topically 2 (two) times daily. Apply to wound on right ankle and cover until healed triamcinolone  cream (KENALOG ) 0.1 % Apply topically to affected redness at right ankle qd/bid as needed Return in about 1 year (around 12/25/2024) for TBSE.  I, Eleanor Blush, CMA, am acting as scribe for Rexene Rattler, MD.   Documentation: I have reviewed the above documentation for accuracy and completeness, and I agree with the above.  Rexene Rattler, MD

## 2023-12-28 ENCOUNTER — Ambulatory Visit: Admitting: Family Medicine

## 2023-12-28 ENCOUNTER — Encounter: Payer: Self-pay | Admitting: Family Medicine

## 2023-12-28 VITALS — BP 120/76 | HR 78 | Ht 67.0 in | Wt 272.0 lb

## 2023-12-28 DIAGNOSIS — M5030 Other cervical disc degeneration, unspecified cervical region: Secondary | ICD-10-CM | POA: Diagnosis not present

## 2023-12-28 MED ORDER — MELOXICAM 15 MG PO TABS: 15.0000 mg | ORAL_TABLET | Freq: Every day | ORAL | 0 refills | Status: DC

## 2023-12-28 NOTE — Progress Notes (Signed)
 Subjective:    Patient ID: Belinda Martin, female    DOB: 1975-04-16, 48 y.o.   MRN: [id number] 12/12/23 Patient has been dealing with pain in her neck and her shoulders for a few months.  She has been diagnosed with tendinitis in her rotator cuff.  She also has been diagnosed with pain and stiffness and muscle spasms in her neck and upper posterior shoulders.  She is now complaining of pain in her anterior neck directly over the cricoid cartilage.  She is also complaining of tenderness and pain in her sternum and chest wall muscles including pectoralis.  She has been going to physical therapy and receiving deep tissue massages with no improvement.  She denies any pain with forward flexion of the neck.  She is reporting some numbness and tingling radiating down both arms into both hands especially at the right.  She states that several years ago she had nerve conduction studies that showed mild carpal tunnel.  She denies any weakness in her arms.  She denies any loss of grip strength.  The numbness and tingling is primarily at night.  At that time, my plan was: Patient is having diffuse muscle pain in the trapezius, sternocleidomastoid muscles, cervical paraspinal muscles, and both shoulders.  She is also complaining of pain in her sternum and is now having anterior neck pain.  Recommended checking a CBC, sedimentation rate, and a TSH.  If sed rate and CBC are normal, I feel that this helps to rule out a more serious underlying pathology that would warrant a CT scan of the head and neck.  Start the patient on prednisone  taper pack.  If the pain improved dramatically with throughout the chest and neck area, consider possible cervical radiculopathy as a potential cause of her diffuse neck pain and shoulder pain especially given the numbness and tingling in her arms.  if so, I will consider obtaining an MRI of the cervical spine.  Patient states that she had an x-ray of her neck recently by her orthopedist that  showed a bone spur but was otherwise normal.  MRI in 2021 showed only mild bulging disc but no significant pathology  12/28/23 Patient saw significant benefit from prednisone  however now that she stopped the prednisone  the pain is returning.  The pain is located primarily in the anterior part of her neck just to the right of midline.  Patient had a slightly elevated sed rate was 38.  As result we did a rheumatologic workup.  Rheumatoid factor and CK level were negative.  However the patient's ANA was positive with a titer of 1:80.  Follow-up double-stranded DNA and ENA test were negative.  Patient is here today to discuss.  She has no previous past medical history that would support a diagnosis of lupus.  Specifically she has never had elevated liver function test, bone marrow abnormalities, pericarditis, pleurisy, neurologic difficulties that would support a diagnosis of lupus.  She denies any photosensitive rashes.  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   Tendonitis 09/2023   Right Shoulder   Past Surgical History:  Procedure Laterality Date   COLONOSCOPY N/A 07/31/2023   Procedure: COLONOSCOPY;  Surgeon: Jinny Carmine, MD;  Location: [facility] ENDOSCOPY;  Service: Endoscopy;  Laterality: N/A;  Patient gets very nausea and vomiting after procedure   Electrocautery of condyloma accuminata  09/2007   Dr Hazel    NASAL SINUS SURGERY     POLYPECTOMY  07/31/2023   Procedure: POLYPECTOMY, INTESTINE;  Surgeon: Jinny Carmine, MD;  Location: [facility] ENDOSCOPY;  Service: Endoscopy;;   TONSILLECTOMY     TRANSTHORACIC ECHOCARDIOGRAM  01/2009   EF 65-70%, normal valves   TRANSTHORACIC ECHOCARDIOGRAM  05/30/2022   NORMAL ECHO.  EF 60 to 65% no RWMA.  Normal diastolic parameters.  Normal RV size function and pressures.  Normal valves.  Normal RAP.   Current Outpatient Medications on File Prior to Visit  Medication Sig Dispense Refill   albuterol  (VENTOLIN  HFA) 108 (90 Base) MCG/ACT inhaler Inhale 2 puffs into the lungs every 6 (six) hours as needed for wheezing or shortness of breath. 8 g 1   ascorbic acid (VITAMIN C) 500 MG tablet Take 500 mg by mouth daily.     Azelaic Acid  15 % gel After skin is thoroughly washed and patted dry, gently but thoroughly massage a thin film of azelaic acid  cream into the affected area twice daily, in the morning and evening. 50 g 3   Biotin 10 MG TABS Take by mouth daily.     cholecalciferol (VITAMIN D3) 25 MCG (1000 UNIT) tablet Take 1,000 Units by mouth daily.     clotrimazole -betamethasone  (LOTRISONE ) cream Apply externally BID prn sx up to 2 wks 15 g 1   Drospirenone  (SLYND ) 4 MG TABS Take 1 tablet (4 mg total) by mouth daily. 84 tablet 3   fexofenadine (ALLEGRA) 180 MG tablet Take 180 mg by mouth daily.     fluticasone  (FLONASE ) 50 MCG/ACT nasal spray Place 2 sprays into both nostrils daily. 16 g 6   folic acid (FOLVITE) 1 MG tablet Take 3 mg by mouth daily.     Glucosamine 500 MG CAPS Take 1,000 mg by mouth daily.     hydrocortisone  2.5 % cream Apply topically 2 (two) times daily as needed (Rash). To area inner thighs and folds for up to 2 weeks. Avoid applying to face, groin, and axilla. Use as directed. Risk of skin atrophy with long-term use reviewed. 30 g 1   Ibuprofen-Famotidine (DUEXIS) 800-26.6 MG TABS Take 1 tablet by mouth as needed.     mometasone  (ELOCON ) 0.1 %  cream Apply 1-2 times daily to hands as needed. Avoid applying to face, groin, and axilla. Use as directed. Long-term use can cause thinning of the skin. 45 g 0   Multiple Vitamins-Calcium (ONE-A-DAY WOMENS FORMULA PO) Take 1 tablet by mouth daily.     mupirocin  ointment (BACTROBAN ) 2 % Apply 1 Application topically 2 (two) times daily. Apply to wound on right ankle and cover until healed 30 g 0   Omega-3 Fatty Acids (FISH OIL) 1000 MG CAPS Take 2 capsules by mouth daily.     predniSONE  (DELTASONE ) 20 MG tablet 3 tabs poqday 1-2, 2 tabs poqday 3-4, 1 tab poqday 5-6 12 tablet  0   tiZANidine  (ZANAFLEX ) 4 MG tablet Take 1 tablet (4 mg total) by mouth every 6 (six) hours as needed for muscle spasms. 30 tablet 0   TRIAMCINOLONE  ACETONIDE, TOP, (TRIANEX ) 0.05 % OINT Apply twice daily to hands for up to 2 weeks then weekends only. Avoid applying to face, groin, and axilla. Use as directed. Risk of skin atrophy with long-term use reviewed. 110 g 1   triamcinolone  cream (KENALOG ) 0.1 % Apply topically to affected redness at right ankle qd/bid as needed 28.4 g 0   vitamin B-12 (CYANOCOBALAMIN) 500 MCG tablet Take 1,000 mcg by mouth daily.     predniSONE  (DELTASONE ) 10 MG tablet Take by mouth. (Patient not taking: Reported on 12/26/2023)     rizatriptan  (MAXALT -MLT) 10 MG disintegrating tablet Take 1 tablet (10 mg total) by mouth as needed for migraine. May repeat in 2 hours if needed (Patient not taking: Reported on 12/26/2023) 10 tablet 0   No current facility-administered medications on file prior to visit.   Allergies  Allergen Reactions   Amoxicillin     REACTION: hives   Social History   Socioeconomic History   Marital status: Married    Spouse name: Redell   Number of children: 0   Years of education: Not on file   Highest education level: Master's degree (e.g., MA, MS, MEng, MEd, MSW, MBA)  Occupational History   Occupation: [employer]    Comment: Interior and spatial designer Med   Tobacco Use   Smoking status: Never   Smokeless tobacco: Never  Vaping Use   Vaping status: Never Used  Substance and Sexual Activity   Alcohol use: Not Currently    Comment: rare   Drug use: No   Sexual activity: Yes    Partners: Male    Birth control/protection: Pill  Other Topics Concern   Not on file  Social History Narrative   Remarried; lives in [place]      She has lost about 90 pounds in the last year-dietary modification and try to increase exercise level.   Her new husband has encouraged her to increase her exercise => she walks most days.  They go hiking on the weekends.     Social Drivers of Corporate investment banker Strain: Not on file  Food Insecurity: Not on file  Transportation Needs: Not on file  Physical Activity: Inactive (05/29/2017)   Exercise Vital Sign    Days of Exercise per Week: 0 days    Minutes of Exercise per Session: 0 min  Stress: Stress Concern Present (05/29/2017)   [employer] of Occupational Health - Occupational Stress Questionnaire    Feeling of Stress : To some extent  Social Connections: Not on file  Intimate Partner Violence: Not on file      Review of Systems     Objective:   Physical Exam Vitals reviewed.  Constitutional:      General: She is not in acute distress.    Appearance: She is well-developed. She is not ill-appearing, toxic-appearing or diaphoretic.  HENT:     Right Ear: Tympanic membrane and ear canal normal.     Left Ear: Tympanic membrane and ear canal normal.     Nose: Nose normal.     Mouth/Throat:     Mouth: Mucous membranes are moist.     Pharynx: Oropharynx is clear. No oropharyngeal exudate or posterior oropharyngeal erythema.  Eyes:     General: No visual field deficit.    Conjunctiva/sclera:  Conjunctivae normal.     Pupils: Pupils are equal, round, and reactive to light.  Neck:     Vascular: No carotid bruit.   Cardiovascular:     Rate and Rhythm: Normal rate and regular rhythm.      Heart sounds: Normal heart sounds.  Pulmonary:     Effort: Pulmonary effort is normal.     Breath sounds: Normal breath sounds.  Musculoskeletal:     Cervical back: Neck supple. No erythema, tenderness or crepitus. Pain with movement and muscular tenderness present. No spinous process tenderness. Normal range of motion.  Lymphadenopathy:     Cervical: No cervical adenopathy.  Skin:    Findings: No erythema or rash.  Neurological:     Mental Status: She is alert.     Cranial Nerves: No cranial nerve deficit, dysarthria or facial asymmetry.     Sensory: No sensory deficit.     Motor: No weakness.     Coordination: Coordination normal.           Assessment & Plan:  DDD (degenerative disc disease), cervical - Plan: Ambulatory referral to Physical Therapy  I suspect that her pain is most likely due to mild degenerative disc disease with muscle stiffness.  Try the patient on meloxicam  15 mg daily and consult physical therapy.  If this is helping L3 complaints musculoskeletal pain.  If the pain worsens dramatically, we may consider a rheumatologic consultation versus further imaging of the neck.  I believe the ANA is likely a false positive

## 2024-01-02 ENCOUNTER — Ambulatory Visit
Admission: EM | Admit: 2024-01-02 | Discharge: 2024-01-02 | Disposition: A | Discharge status: Home / Self Care | Attending: Emergency Medicine | Admitting: Emergency Medicine

## 2024-01-02 DIAGNOSIS — Z203 Contact with and (suspected) exposure to rabies: Secondary | ICD-10-CM | POA: Diagnosis not present

## 2024-01-02 DIAGNOSIS — Z23 Encounter for immunization: Secondary | ICD-10-CM

## 2024-01-02 MED ORDER — RABIES VACCINE, PCEC IM SUSR
1.0000 mL | Freq: Once | INTRAMUSCULAR | Status: AC
  Administered 2024-01-02: 1 mL via INTRAMUSCULAR

## 2024-01-02 NOTE — ED Triage Notes (Signed)
 Patient to urgent care for day 14 rabies vaccine .   Denies any other complaints/ tolerated other vaccines well.

## 2024-01-10 ENCOUNTER — Ambulatory Visit: Admitting: Family Medicine

## 2024-01-10 ENCOUNTER — Encounter: Payer: Self-pay | Admitting: Family Medicine

## 2024-01-10 VITALS — BP 123/74 | HR 91 | Temp 97.6°F | Ht 67.0 in | Wt 271.2 lb

## 2024-01-10 DIAGNOSIS — J069 Acute upper respiratory infection, unspecified: Secondary | ICD-10-CM

## 2024-01-10 LAB — INFLUENZA A AND B AG, IMMUNOASSAY
INFLUENZA A ANTIGEN: NOT DETECTED
INFLUENZA B ANTIGEN: NOT DETECTED

## 2024-01-10 MED ORDER — ALBUTEROL SULFATE HFA 108 (90 BASE) MCG/ACT IN AERS: 2.0000 | INHALATION_SPRAY | Freq: Four times a day (QID) | RESPIRATORY_TRACT | 1 refills | Status: AC | PRN

## 2024-01-10 MED ORDER — HYDROCODONE BIT-HOMATROP MBR 5-1.5 MG/5ML PO SOLN: 5.0000 mL | Freq: Three times a day (TID) | ORAL | 0 refills | Status: DC | PRN

## 2024-01-10 NOTE — Progress Notes (Addendum)
 Subjective:  HPI: Belinda Martin is a 47 y.o. female presenting on 01/10/2024 for Acute Visit (Cough until vomiting , itching ears, sore throat since yesterday )   HPI Patient is in today for cough, nasal congestion, postnasal drip, itching ears, and sore throat since yesterday Denies fever, chills, body aches, SOB, wheezing, pleurisy, N/V/D Negative home covid test x1, was exposed to a sick coworker who has covid Has tried robitussin   Review of Systems  All other systems reviewed and are negative.   Relevant past medical history reviewed and updated as indicated.   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   Tendonitis 09/2023   Right Shoulder     Past Surgical History:  Procedure Laterality Date   COLONOSCOPY N/A 07/31/2023   Procedure: COLONOSCOPY;  Surgeon: Jinny Carmine, MD;  Location: [facility] ENDOSCOPY;  Service: Endoscopy;  Laterality: N/A;  Patient gets very nausea and vomiting after procedure   Electrocautery of condyloma accuminata  09/2007   Dr Hazel   NASAL SINUS SURGERY     POLYPECTOMY  07/31/2023   Procedure: POLYPECTOMY, INTESTINE;  Surgeon: Jinny Carmine, MD;  Location: [facility] ENDOSCOPY;  Service: Endoscopy;;   TONSILLECTOMY     TRANSTHORACIC ECHOCARDIOGRAM  01/2009   EF 65-70%, normal valves   TRANSTHORACIC ECHOCARDIOGRAM  05/30/2022   NORMAL ECHO.  EF 60 to 65% no RWMA.   Normal diastolic parameters.  Normal RV size function and pressures.  Normal valves.  Normal RAP.    Allergies and medications reviewed and updated.   Current Outpatient Medications:    ascorbic acid (VITAMIN C) 500 MG tablet, Take 500 mg by mouth daily., Disp: , Rfl:    Azelaic Acid  15 % gel, After skin is thoroughly washed and patted dry, gently but thoroughly massage a thin film of azelaic acid  cream into the affected area twice daily, in the morning and evening., Disp: 50 g, Rfl: 3   Biotin 10 MG TABS, Take by mouth daily., Disp: , Rfl:    cholecalciferol (VITAMIN D3) 25 MCG (1000 UNIT) tablet, Take 1,000 Units by mouth daily., Disp: , Rfl:    clotrimazole -betamethasone  (LOTRISONE ) cream, Apply externally BID prn sx up to 2 wks, Disp: 15 g, Rfl: 1   Drospirenone  (SLYND ) 4 MG TABS, Take 1 tablet (4 mg total) by mouth daily., Disp: 84 tablet, Rfl: 3   fexofenadine (ALLEGRA) 180 MG tablet, Take 180 mg by mouth daily., Disp: , Rfl:    fluticasone  (FLONASE ) 50 MCG/ACT nasal spray, Place 2 sprays into both nostrils daily., Disp: 16 g, Rfl: 6   folic acid (FOLVITE) 1 MG tablet, Take 3 mg by mouth daily., Disp: , Rfl:    Glucosamine 500 MG CAPS, Take 1,000 mg by mouth daily., Disp: , Rfl:  HYDROcodone  bit-homatropine (HYCODAN) 5-1.5 MG/5ML syrup, Take 5 mLs by mouth every 8 (eight) hours as needed for cough., Disp: 120 mL, Rfl: 0   hydrocortisone  2.5 % cream, Apply topically 2 (two) times daily as needed (Rash). To area inner thighs and folds for up to 2 weeks. Avoid applying to face, groin, and axilla. Use as directed. Risk of skin atrophy with long-term use reviewed., Disp: 30 g, Rfl: 1   Ibuprofen-Famotidine (DUEXIS) 800-26.6 MG TABS, Take 1 tablet by mouth as needed., Disp: , Rfl:    meloxicam  (MOBIC ) 15 MG tablet, Take 1 tablet (15 mg total) by mouth daily., Disp: 30 tablet, Rfl: 0   mometasone  (ELOCON ) 0.1 % cream, Apply 1-2 times daily to hands as needed. Avoid applying to face, groin, and  axilla. Use as directed. Long-term use can cause thinning of the skin., Disp: 45 g, Rfl: 0   Multiple Vitamins-Calcium (ONE-A-DAY WOMENS FORMULA PO), Take 1 tablet by mouth daily., Disp: , Rfl:    mupirocin  ointment (BACTROBAN ) 2 %, Apply 1 Application topically 2 (two) times daily. Apply to wound on right ankle and cover until healed, Disp: 30 g, Rfl: 0   Omega-3 Fatty Acids (FISH OIL) 1000 MG CAPS, Take 2 capsules by mouth daily., Disp: , Rfl:    predniSONE  (DELTASONE ) 20 MG tablet, 3 tabs poqday 1-2, 2 tabs poqday 3-4, 1 tab poqday 5-6, Disp: 12 tablet, Rfl: 0   tiZANidine  (ZANAFLEX ) 4 MG tablet, Take 1 tablet (4 mg total) by mouth every 6 (six) hours as needed for muscle spasms., Disp: 30 tablet, Rfl: 0   TRIAMCINOLONE  ACETONIDE, TOP, (TRIANEX ) 0.05 % OINT, Apply twice daily to hands for up to 2 weeks then weekends only. Avoid applying to face, groin, and axilla. Use as directed. Risk of skin atrophy with long-term use reviewed., Disp: 110 g, Rfl: 1   triamcinolone  cream (KENALOG ) 0.1 %, Apply topically to affected redness at right ankle qd/bid as needed, Disp: 28.4 g, Rfl: 0   vitamin B-12 (CYANOCOBALAMIN) 500 MCG tablet, Take 1,000 mcg by mouth daily., Disp: , Rfl:    albuterol  (VENTOLIN  HFA) 108 (90 Base) MCG/ACT inhaler, Inhale 2 puffs into the lungs every 6 (six) hours as needed for wheezing or shortness of breath., Disp: 8 g, Rfl: 1  Allergies  Allergen Reactions   Amoxicillin     REACTION: hives    Objective:   BP 123/74   Pulse 91   Temp 97.6 F (36.4 C)   Ht 5' 7 (1.702 m)   Wt 271 lb 3.2 oz (123 kg)   SpO2 99%   BMI 42.48 kg/m      01/10/2024   10:03 AM 12/28/2023    4:07 PM 12/19/2023    5:29 PM  Vitals with BMI  Height 5' 7 5' 7   Weight 271 lbs 3 oz 272 lbs 272 lbs  BMI 42.47 42.59 42.59  Systolic 123 120   Diastolic 74 76   Pulse 91 78      Physical Exam Vitals and nursing note reviewed.  Constitutional:      Appearance: Normal appearance. She is normal  weight.  HENT:     Head: Normocephalic and atraumatic.     Right Ear: Tympanic membrane, ear canal and external ear normal.     Left Ear: Tympanic membrane, ear canal and external ear normal.     Nose: Congestion present.     Right Sinus: No maxillary sinus tenderness or frontal sinus tenderness.  Left Sinus: No maxillary sinus tenderness or frontal sinus tenderness.     Mouth/Throat:     Mouth: Mucous membranes are moist.     Pharynx: Oropharynx is clear.  Eyes:     Extraocular Movements: Extraocular movements intact.     Conjunctiva/sclera: Conjunctivae normal.     Pupils: Pupils are equal, round, and reactive to light.  Cardiovascular:     Rate and Rhythm: Normal rate and regular rhythm.     Pulses: Normal pulses.     Heart sounds: Normal heart sounds.  Pulmonary:     Effort: Pulmonary effort is normal.     Breath sounds: Normal breath sounds.  Musculoskeletal:     Cervical back: No tenderness.  Lymphadenopathy:     Head:     Right side of head: Submandibular adenopathy present.     Left side of head: Submandibular adenopathy present.     Cervical: No cervical adenopathy.  Skin:    General: Skin is warm and dry.  Neurological:     General: No focal deficit present.     Mental Status: She is alert and oriented to person, place, and time. Mental status is at baseline.  Psychiatric:        Mood and Affect: Mood normal.        Behavior: Behavior normal.        Thought Content: Thought content normal.        Judgment: Judgment normal.     Assessment & Plan:  Viral URI with cough Assessment & Plan: Flu negative in office, home covid negative. Reassured patient that symptoms and exam findings are most consistent with a viral upper respiratory infection and explained lack of efficacy of antibiotics against viruses.  Discussed expected course and features suggestive of secondary bacterial infection.  Continue supportive care. Increase fluid intake with water or electrolyte  solution like pedialyte. Encouraged acetaminophen  as needed for fever/pain. Encouraged salt water gargling, chloraseptic spray and throat lozenges. Encouraged OTC guaifenesin. Encouraged saline sinus flushes and/or neti with humidified air.  Start Hycodan q8h PRN and Albuterol  PRN  Orders: -     Influenza A and B Ag, Immunoassay  Other orders -     HYDROcodone  Bit-Homatrop MBr; Take 5 mLs by mouth every 8 (eight) hours as needed for cough.  Dispense: 120 mL; Refill: 0 -     Albuterol  Sulfate HFA; Inhale 2 puffs into the lungs every 6 (six) hours as needed for wheezing or shortness of breath.  Dispense: 8 g; Refill: 1     Follow up plan: Return if symptoms worsen or fail to improve.  Jeoffrey GORMAN Barrio, FNP

## 2024-01-10 NOTE — Addendum Note (Signed)
 Addended by: [name] on: 01/10/2024 10:52 AM   Modules accepted: Orders

## 2024-01-10 NOTE — Addendum Note (Signed)
 Addended by: [name] on: 01/10/2024 10:58 AM   Modules accepted: Orders

## 2024-01-10 NOTE — Assessment & Plan Note (Signed)
 Flu negative in office, home covid negative. Reassured patient that symptoms and exam findings are most consistent with a viral upper respiratory infection and explained lack of efficacy of antibiotics against viruses.  Discussed expected course and features suggestive of secondary bacterial infection.  Continue supportive care. Increase fluid intake with water or electrolyte solution like pedialyte. Encouraged acetaminophen  as needed for fever/pain. Encouraged salt water gargling, chloraseptic spray and throat lozenges. Encouraged OTC guaifenesin. Encouraged saline sinus flushes and/or neti with humidified air.  Start Hycodan q8h PRN and Albuterol  PRN

## 2024-01-14 ENCOUNTER — Encounter: Payer: Self-pay | Admitting: Family Medicine

## 2024-01-15 ENCOUNTER — Other Ambulatory Visit: Payer: Self-pay | Admitting: Family Medicine

## 2024-01-15 MED ORDER — DOXYCYCLINE HYCLATE 100 MG PO TABS
100.0000 mg | ORAL_TABLET | Freq: Two times a day (BID) | ORAL | 0 refills | Status: AC
Start: 2024-01-15 — End: 2024-01-22

## 2024-01-16 ENCOUNTER — Ambulatory Visit: Payer: Self-pay

## 2024-01-16 NOTE — Telephone Encounter (Signed)
 FYI Only or Action Required?: Action required by provider: update on patient status/symptoms and requesting a chest xray.  Patient was last seen in primary care on 01/10/2024 by Belinda Jeoffrey RAMAN, FNP.  Called Nurse Triage reporting Epistaxis and Sinusitis.  Symptoms began today.  Interventions attempted: Other: put a tissue up her right nostril, 15 minutes of direct pressure.  Symptoms are: coughing fit followed by nosebleed from right nostril completely resolved. Worsening sinus pain, thick green nasal congestion, coughing fits causing gagging/vomiting/burning in chest and back.  Triage Disposition: Home Care  Patient/caregiver understands and will follow disposition?: Yes          Copied from CRM [id number]. Topic: Clinical - Red Word Triage >> Jan 16, 2024  8:15 AM Belinda Martin wrote: Red Word that prompted transfer to Nurse Triage: THICK GREEN MUCUS COMING OUT OF NOSE, FACE PAIN. BLOOD DRIPPING FROM NOSE., COUGHING TO THE POINT OF GAGGING AND THROWING UP, COUGHING IS CAUSING BURNING IN CHEST AND BACK. Reason for Disposition  [1] Nosebleed AND [2] bleeding present < 30 minutes AND [3] using correct technique per guideline  Answer Assessment - Initial Assessment Questions Patient had not held pressure when calling in, she had treated with placing tissue in nostril. Advised patient to hold 15 minutes of pressure. Patient states she was told by NP Belinda to call in and update on symptoms. She states she thinks she needs a chest x ray, which she discussed with the provider. Patient has not picked up or started her doxycycline .   1. AMOUNT OF BLEEDING: How bad is the bleeding? How much blood was lost? Has the bleeding stopped?     She states she was about to head out the door for work and her nose started bleeding. Right nostril bleeding steady stream when it started, unsure quantity. She put a tissue up her nostril and states blood is not pouring out but she has some steady bloody  and green discharge. RN advised proper technique to patient to hold 15 minutes of direct pressure, patient states nosebleed has resolved now.  2. ONSET: When did the nosebleed start?      This morning, 15 minutes ago.  3. FREQUENCY: How many nosebleeds have you had in the last 24 hours?      1.  4. RECURRENT SYMPTOMS: Have there been other recent nosebleeds? If Yes, ask: How long did it take you to stop the bleeding? What worked best?      No.  5. CAUSE: What do you think caused this nosebleed?     She states she was having a coughing fit just before the nosebleed started.  6. LOCAL FACTORS: Do you have any cold symptoms?, Have you been rubbing or picking at your nose?     Yes. She is currently diagnosed with sinus infection. She states her nose has been running the past 24 hours. She states there is constant, thick green mucous coming out of her nose. She has been coughing severely, with fits that cause gagging/vomiting and burning in her chest and back.  7. SYSTEMIC FACTORS: Do you have high blood pressure or any bleeding problems?     No high blood pressure. 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  8. BLOOD THINNERS: Do you take any blood thinners? (e.g., aspirin, clopidogrel / Plavix, coumadin, heparin). Notes: Other strong blood thinners include: Arixtra (fondaparinux), Eliquis (apixaban), Pradaxa (dabigatran), and Xarelto (rivaroxaban).     No.  9.  OTHER SYMPTOMS: Do you have any other symptoms? (e.g., lightheadedness)     Denies lightheadedness. Patient unsure if she has had fever, she states she has felt warm and chills and then wet with sweat at night. Thick green nasal discharge, coughing fits causing gagging or vomiting and chest/back burning pain, sinus pain.  10. PREGNANCY: Is there any chance you are pregnant? When was your last menstrual period?       On birth control.  Protocols used:  Nosebleed-A-AH

## 2024-01-25 ENCOUNTER — Encounter

## 2024-01-29 ENCOUNTER — Ambulatory Visit: Admitting: Dietician

## 2024-02-05 ENCOUNTER — Encounter: Attending: Family Medicine | Admitting: Dietician

## 2024-02-05 DIAGNOSIS — J45909 Unspecified asthma, uncomplicated: Secondary | ICD-10-CM

## 2024-02-05 NOTE — Progress Notes (Signed)
 Medical Nutrition Therapy: Visit start time: 1715  end time: 1745  Assessment:  Diagnosis: obesity Medical history changes: recent sinus infection Psychosocial issues/ stress concerns: none  Medications, supplement changes: reconciled list in medical record   Current weight: 275.0 lbs (with jeans, shoes, sweatshirt) Height: 5'7 BMI: 43.07  Progress and evaluation:  Had dog bite from stray puppy necessitating rabies injections x11 Was hiking 3 miles, 3 times a week until developing bursitis  Then had severe sinus infection.  More restaurant eating, soft foods during sinus infection.  Diagnosed with bulging disc in neck; deep tissue massage helped with pain Reports resuming food tracking;  some cravings for sweets but choosing healthy options    Dietary Intake:  Usual eating pattern includes 3 meals and 2 snacks per day. Dining out frequency: 0-1 meals per week.  Breakfast: 3 keto sausage balls 230kcal, 2 Good yogurt,  Snack: probiotic yogurt bites Lunch: 4.5oz chicken thigh with cheese, 1 sl bacon, green peas 1/2c,  Snack: protein shake with 42g protein (helps with craving sweets) Supper: pork chop, zucchini and squash, protein pasta, low sugar pasta sauce Snack: none Beverages: water 48+oz daily, trying flavoring packets   Physical activity: workouts with trainer + at home  Intervention:   Nutrition Care Education:  Basic nutrition: reviewed appropriate nutrient balance; sustainable eating pattern    Weight control: reviewed progress since previous visit; tracking food intake; making specific menus work for personal preferences, family members tastes, sustainability; healthy flavoring options for water   Other Intervention Notes: Patient is motivated and in process of resuming high protein eating pattern and regular exercise. Goal is to continue with this process.   Nutritional Diagnosis:  [place]-3.3 Overweight/obesity As related to history of excess calories and inadequate  physical activity. As evidenced by patient with current BMI of 43, working on lifestyle changes to promote weight loss.    Education Materials given:    Learner/ who was taught:  Patient   Level of understanding: Verbalizes/ demonstrates competency  Demonstrated degree of understanding via:   Teach back Learning barriers: None  Willingness to learn/ readiness for change: Eager, change in progress   Monitoring and Evaluation:  Dietary intake, exercise, and body weight      follow up: 05/08/24

## 2024-02-12 ENCOUNTER — Encounter: Payer: Self-pay | Admitting: Family Medicine

## 2024-02-15 ENCOUNTER — Other Ambulatory Visit: Payer: Self-pay | Admitting: Family Medicine

## 2024-02-15 MED ORDER — MELOXICAM 15 MG PO TABS: 15.0000 mg | ORAL_TABLET | Freq: Every day | ORAL | 3 refills | Status: AC

## 2024-02-27 ENCOUNTER — Ambulatory Visit
Admission: RE | Admit: 2024-02-27 | Discharge: 2024-02-27 | Disposition: A | Discharge status: Home / Self Care | Source: Ambulatory Visit | Attending: Obstetrics and Gynecology | Admitting: Obstetrics and Gynecology

## 2024-02-27 DIAGNOSIS — Z1231 Encounter for screening mammogram for malignant neoplasm of breast: Secondary | ICD-10-CM | POA: Diagnosis not present

## 2024-04-09 NOTE — Progress Notes (Unsigned)
 "   Duanne Butler DASEN, MD   No chief complaint on file.   HPI:      [name] is a 49 y.o. G1P0010 whose LMP was No LMP recorded. (Menstrual status: Oral contraceptives)., presents today for ***    Patient Active Problem List   Diagnosis Date Noted   Viral URI with cough 01/10/2024   Rabies exposure 12/22/2023   Screening for colon cancer 07/31/2023   Polyp of descending colon 07/31/2023   Blood in stool 05/10/2023   COVID-19 virus infection 04/19/2023   Upper respiratory infection 04/14/2023   DOE (dyspnea on exertion) 04/21/2022   Fatigue 04/21/2022   Rapid palpitations 05/11/2020   MTHFR gene mutation    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   Pseudotumor cerebri    Sleep disorder    Morbid obesity (HCC)    DDD (degenerative disc disease), lumbar    Environmental and seasonal allergies 07/21/2013   Asthma 01/15/2009    Past Surgical History:  Procedure Laterality Date   COLONOSCOPY N/A 07/31/2023   Procedure: COLONOSCOPY;  Surgeon: Jinny Carmine, MD;  Location: [facility] ENDOSCOPY;  Service: Endoscopy;  Laterality: N/A;  Patient gets very nausea and vomiting after procedure   Electrocautery of condyloma accuminata  09/2007   Dr Hazel   NASAL SINUS SURGERY     POLYPECTOMY  07/31/2023   Procedure: POLYPECTOMY, INTESTINE;  Surgeon: Jinny Carmine, MD;  Location: [facility] ENDOSCOPY;  Service: Endoscopy;;   TONSILLECTOMY     TRANSTHORACIC ECHOCARDIOGRAM  01/2009   EF 65-70%, normal valves   TRANSTHORACIC ECHOCARDIOGRAM  05/30/2022   NORMAL ECHO.  EF 60 to 65% no RWMA.  Normal diastolic parameters.  Normal RV size function and pressures.  Normal valves.  Normal RAP.    Family History  Problem Relation Age of Onset   Arrhythmia Mother    Heart murmur Mother    Endometriosis Mother    Fibromyalgia Mother    Irritable bowel syndrome Mother    Deep vein thrombosis Father        MTHFR homozygous   Atrial fibrillation Father        Relatively recent diagnosis.    Hypertension Father    Peripheral Artery Disease Father        Carotid artery stenosis-CEA   Crohn's disease Maternal Grandmother    Diabetes Maternal Grandmother    Heart failure Paternal Grandfather        CHF;   Stomach cancer Paternal Grandfather    Heart murmur Paternal Uncle    Coronary artery disease Neg Hx        premature CAD   Breast cancer Neg Hx     Social History   Socioeconomic History   Marital status: Married    Spouse name: Redell   Number of children: 0   Years of education: Not on file   Highest education level: Master's degree (e.g., MA, MS, MEng, MEd, MSW, MBA)  Occupational History   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   Her new husband has encouraged her to increase her exercise => she walks most days.  They go hiking on the weekends.     Social Drivers of Health   Tobacco Use: Low Risk (01/10/2024)   Patient History    Smoking Tobacco Use: Never    Smokeless Tobacco Use: Never    Passive Exposure: Not on file  Financial Resource Strain: Not on file  Food Insecurity: Not on file  Transportation Needs: Not on file  Physical Activity: Not on file  Stress: Not on file  Social Connections: Not on file  Intimate Partner Violence: Not on file  Depression (PHQ2-9): Low Risk (05/10/2023)   Depression (PHQ2-9)    PHQ-2 Score: 4  Alcohol Screen: Not on file  Housing: Not on file  Utilities: Not on file  Health Literacy: Not on file     Outpatient Medications Prior to Visit  Medication Sig Dispense Refill   albuterol  (VENTOLIN  HFA) 108 (90 Base) MCG/ACT inhaler Inhale 2 puffs into the lungs every 6 (six) hours as needed for wheezing or shortness of breath. 8 g 1   ascorbic acid (VITAMIN C) 500 MG tablet Take 500 mg by mouth daily.     Azelaic Acid  15 % gel After skin is thoroughly washed and patted dry, gently but thoroughly massage a thin film of azelaic acid  cream into the affected area twice daily, in the morning and evening. 50 g 3   Biotin 10 MG TABS Take by mouth daily.     cholecalciferol (VITAMIN D3) 25 MCG (1000 UNIT) tablet Take 1,000 Units by mouth daily.     clotrimazole -betamethasone  (LOTRISONE ) cream Apply externally BID prn sx up to 2 wks 15 g 1   Drospirenone  (SLYND ) 4 MG TABS Take 1 tablet (4 mg total) by mouth daily. 84 tablet 3   fexofenadine (ALLEGRA) 180 MG tablet Take 180 mg by mouth daily.     fluticasone  (FLONASE ) 50 MCG/ACT nasal spray Place 2 sprays into both nostrils daily. 16 g 6   folic acid (FOLVITE) 1 MG tablet Take 3 mg by mouth daily.     Glucosamine 500 MG CAPS Take 1,000 mg by mouth daily.     HYDROcodone  bit-homatropine (HYCODAN) 5-1.5 MG/5ML syrup Take 5 mLs by mouth every 8 (eight) hours as needed for cough. 120 mL 0   hydrocortisone  2.5 % cream Apply topically 2 (two) times daily as needed (Rash). To area inner thighs and folds for up to 2 weeks. Avoid applying to face, groin, and axilla. Use as directed. Risk of skin atrophy with long-term use reviewed. 30 g 1   Ibuprofen-Famotidine (DUEXIS) 800-26.6 MG TABS Take 1 tablet by mouth as needed.     meloxicam  (MOBIC ) 15 MG tablet Take 1 tablet (15 mg total) by mouth daily. 30 tablet 3   mometasone  (ELOCON ) 0.1 % cream Apply 1-2 times daily to hands as needed. Avoid applying to face, groin, and axilla. Use as directed. Long-term use can cause thinning of the skin. 45 g 0   Multiple Vitamins-Calcium (ONE-A-DAY WOMENS FORMULA PO) Take 1  tablet by mouth daily.     mupirocin  ointment (BACTROBAN ) 2 % Apply 1 Application topically 2 (two) times daily. Apply to wound on right ankle and cover until healed 30 g 0   Omega-3 Fatty Acids (FISH OIL) 1000 MG CAPS Take 2 capsules by mouth daily.  predniSONE  (DELTASONE ) 20 MG tablet 3 tabs poqday 1-2, 2 tabs poqday 3-4, 1 tab poqday 5-6 12 tablet 0   tiZANidine  (ZANAFLEX ) 4 MG tablet Take 1 tablet (4 mg total) by mouth every 6 (six) hours as needed for muscle spasms. 30 tablet 0   TRIAMCINOLONE  ACETONIDE, TOP, (TRIANEX ) 0.05 % OINT Apply twice daily to hands for up to 2 weeks then weekends only. Avoid applying to face, groin, and axilla. Use as directed. Risk of skin atrophy with long-term use reviewed. 110 g 1   triamcinolone  cream (KENALOG ) 0.1 % Apply topically to affected redness at right ankle qd/bid as needed 28.4 g 0   vitamin B-12 (CYANOCOBALAMIN) 500 MCG tablet Take 1,000 mcg by mouth daily.     No facility-administered medications prior to visit.      ROS:  Review of Systems BREAST: No symptoms   OBJECTIVE:   Vitals:  There were no vitals taken for this visit.  Physical Exam  Results: No results found for this or any previous visit (from the past 24 hours).   Assessment/Plan: No diagnosis found.    No orders of the defined types were placed in this encounter.     No follow-ups on file.  Xzaviar Maloof B. Jerrod Damiano, PA-C 04/09/2024 11:02 AM      "

## 2024-04-10 ENCOUNTER — Encounter: Payer: Self-pay | Admitting: Obstetrics and Gynecology

## 2024-04-10 ENCOUNTER — Encounter: Payer: Self-pay | Admitting: Physical Therapy

## 2024-04-10 ENCOUNTER — Ambulatory Visit: Attending: Family Medicine

## 2024-04-10 ENCOUNTER — Ambulatory Visit: Admitting: Obstetrics and Gynecology

## 2024-04-10 VITALS — BP 145/79 | HR 105 | Ht 67.0 in | Wt 275.0 lb

## 2024-04-10 DIAGNOSIS — R200 Anesthesia of skin: Secondary | ICD-10-CM | POA: Diagnosis present

## 2024-04-10 DIAGNOSIS — M542 Cervicalgia: Secondary | ICD-10-CM | POA: Insufficient documentation

## 2024-04-10 DIAGNOSIS — N898 Other specified noninflammatory disorders of vagina: Secondary | ICD-10-CM

## 2024-04-10 DIAGNOSIS — M5030 Other cervical disc degeneration, unspecified cervical region: Secondary | ICD-10-CM | POA: Diagnosis not present

## 2024-04-10 DIAGNOSIS — R202 Paresthesia of skin: Secondary | ICD-10-CM | POA: Diagnosis present

## 2024-04-10 NOTE — Patient Instructions (Signed)
 I value your feedback and you entrusting [place] with your care. If you get a [place] patient survey, I would appreciate you taking the time to let us know about your experience today. Thank you! ? ? ?

## 2024-04-10 NOTE — Therapy (Signed)
 " OUTPATIENT PHYSICAL THERAPY CERVICAL EVALUATION   Patient Name: Belinda Martin MRN: [id number] DOB:September 08, 1975, 49 y.o., female Today's Date: 04/10/2024  END OF SESSION:  PT End of Session - 04/10/24 1555     Visit Number 1    Number of Visits 17    Date for Recertification  06/05/24    PT Start Time 1600    PT Stop Time 1641    PT Time Calculation (min) 41 min    Activity Tolerance Patient tolerated treatment well    Behavior During Therapy [facility] for tasks assessed/performed          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   Tendonitis 09/2023   Right Shoulder   Past Surgical History:  Procedure Laterality Date   COLONOSCOPY N/A 07/31/2023   Procedure: COLONOSCOPY;  Surgeon: Jinny Carmine, MD;  Location: [facility] ENDOSCOPY;  Service: Endoscopy;  Laterality: N/A;  Patient gets very nausea and vomiting after procedure   Electrocautery of condyloma accuminata  09/2007   Dr Hazel   NASAL SINUS SURGERY     POLYPECTOMY  07/31/2023   Procedure: POLYPECTOMY, INTESTINE;  Surgeon: Jinny Carmine, MD;  Location: [facility] ENDOSCOPY;  Service: Endoscopy;;   TONSILLECTOMY     TRANSTHORACIC ECHOCARDIOGRAM  01/2009   EF 65-70%, normal valves   TRANSTHORACIC ECHOCARDIOGRAM  05/30/2022   NORMAL ECHO.  EF 60 to 65% no RWMA.  Normal diastolic parameters.  Normal RV size function and pressures.  Normal valves.   Normal RAP.   Patient Active Problem List   Diagnosis Date Noted   Viral URI with cough 01/10/2024   Rabies exposure 12/22/2023   Screening for colon cancer 07/31/2023   Polyp of descending colon 07/31/2023   Blood in stool 05/10/2023   COVID-19 virus infection 04/19/2023   Upper respiratory infection 04/14/2023   DOE (dyspnea on exertion) 04/21/2022   Fatigue 04/21/2022   Rapid palpitations 05/11/2020   MTHFR gene mutation    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   Pseudotumor cerebri    Sleep disorder    Morbid obesity (HCC)    DDD (degenerative disc disease), lumbar    Environmental and seasonal allergies 07/21/2013   Asthma 01/15/2009    PCP: Duanne Butler DASEN, MD  REFERRING PROVIDER: Duanne Butler DASEN, MD  REFERRING DIAG: M50.30 (ICD-10-CM) - DDD (degenerative disc disease), cervical  THERAPY DIAG:  Numbness and tingling in both hands  Neck pain  Rationale for Evaluation and Treatment: Rehabilitation  ONSET DATE: chronic  SUBJECTIVE:  SUBJECTIVE STATEMENT: Patient has intermittent pain in anterior neck and base of posterior neck. She reports numbness in her hands when she lays on her side (hand depends on which side she is on) but when she lays on her back, the numbness goes away. Numbness is mostly when she is laying down They did the EMG for carpal tunnel at [facility] and stated she does have mild carpal tunnel. At one point, they thought she have tendinitis in her R shoulder but that was ruled out due other symptoms. She has been on prednisone  which made a difference so they are thinking it is cervical spine related.   She has made ergonomical changes to her posture at work.   Hand dominance: Right  PERTINENT HISTORY:  Per MD note 12/28/23, Patient is having  diffuse muscle pain in the trapezius, sternocleidomastoid muscles, cervical paraspinal muscles, and both shoulders.  She is also complaining of pain in her sternum and is now having anterior neck pain. Start the patient on prednisone  taper pack.  If the pain improved dramatically with throughout the chest and neck area, consider possible cervical radiculopathy as a potential cause of her diffuse neck pain and shoulder pain especially given the numbness and tingling in her arms.  PAIN:  Are you having pain? Yes: NPRS scale: 0/10 current; 6/10 worst Pain location: anterior neck, hands Pain description: discomfort, numbness Aggravating factors: intermittent, laying on one side  Relieving factors: laying on her back   PRECAUTIONS: None  RED FLAGS: None     WEIGHT BEARING RESTRICTIONS: No  FALLS:  Has patient fallen in last 6 months? No  LIVING ENVIRONMENT: Lives with: lives with their family Lives in: House/apartment   OCCUPATION: Mental Health therapist  PLOF: Independent  PATIENT GOALS: to resolve numbness   OBJECTIVE:  Note: Objective measures were completed at Evaluation unless otherwise noted.  DIAGNOSTIC FINDINGS:  N/A   PATIENT SURVEYS:  NDI:  NECK DISABILITY INDEX  Date: 04/10/24 Score  Pain intensity 0 = I have no pain at the moment  2. Personal care (washing, dressing, etc.) 0 = I can look after myself normally without causing extra pain  3. Lifting 0 =  I can lift heavy weights without extra pain  4. Reading 0 = I can read as much as I want to with no pain in my neck  5. Headaches 2 =  I have moderate headaches, which come infrequently  6. Concentration 0 =  I can concentrate fully when I want to with no difficulty  7. Work 0 =  I can do as much work as I want to  8. Driving 0 = I can drive my car without any neck pain  9. Sleeping 4 = My sleep is greatly disturbed (3-5 hrs sleepless)   10. Recreation 2 = I am able to engage in most, but not all of my usual  recreation activities because of   pain in my neck  Total 8/50 = 16%   Minimum Detectable Change (90% confidence): 5 points or 10% points  COGNITION: Overall cognitive status: Within functional limits for tasks assessed  SENSATION: WFL - occasional numbness with positioning   POSTURE: No Significant postural limitations  PALPATION: Tightness to B upper traps and levator scapulae, suboccipitals  No TTP throughout cervical spine    CERVICAL ROM:   Active ROM A/PROM (deg) eval  Flexion WFL  Extension WFL  Right lateral flexion WFL  Left lateral flexion WFL  Right rotation [facility]  Left  rotation WFL   (Blank rows = not tested)  UPPER EXTREMITY ROM:  All UE ROM   UPPER EXTREMITY MMT:  MMT Right eval Left eval  Shoulder flexion 5 5  Shoulder extension    Shoulder abduction 5 5  Shoulder adduction    Shoulder extension    Shoulder internal rotation    Shoulder external rotation    Middle trapezius    Lower trapezius    Elbow flexion 5 5  Elbow extension 5 5  Wrist flexion    Wrist extension    Wrist ulnar deviation    Wrist radial deviation    Wrist pronation    Wrist supination    Grip strength 5 5   (Blank rows = not tested)  CERVICAL SPECIAL TESTS:  Spurling's test: Negative and Distraction test: Negative   TREATMENT DATE: 04/10/2024                                                                                                                                PATIENT EDUCATION:  Education details: HEP, POC, goals  Person educated: Patient Education method: Explanation, Demonstration, and Handouts Education comprehension: verbalized understanding and returned demonstration  HOME EXERCISE PROGRAM: Access Code: [email or web address] URL: [email or web address] Date: 04/10/2024 Prepared by: Maryanne Finder  Exercises - Seated Upper Trapezius Stretch  - 2-3 x daily - 5-7 x weekly - 3-5 reps - 30-60 seconds hold - Seated Levator Scapulae Stretch  - 2-3 x  daily - 5-7 x weekly - 3-5 reps - 30-60 seconds hold  ASSESSMENT:  CLINICAL IMPRESSION: Patient is a 49 y.o. female who was seen today for physical therapy evaluation and treatment for neck pain with numbness/tingling in B UE intermittently. Patient with negative Spurling's and Distraction test with no reports of numbness to B hands. Further testing warranted to rule out carpal tunnel vs cervical radiculopathy. Patient demonstrates adequate UE strength and cervical ROM, however demonstrates significant tightness in B UT and levator scapulae. HEP initiated for UT and levator stretching with GHJ stabilized under buttocks. Patient will benefit from skilled PT interventions to address listed impairments to improve quality of life and improve sleep quality.   OBJECTIVE IMPAIRMENTS: decreased endurance, decreased strength, hypomobility, impaired flexibility, impaired sensation, postural dysfunction, and pain.   ACTIVITY LIMITATIONS: lifting, sleeping, and hygiene/grooming  PARTICIPATION LIMITATIONS: occupation and yard work  PERSONAL FACTORS: Age, Behavior pattern, Fitness, Past/current experiences, Profession, and Time since onset of injury/illness/exacerbation are also affecting patient's functional outcome.   REHAB POTENTIAL: Good  CLINICAL DECISION MAKING: Stable/uncomplicated  EVALUATION COMPLEXITY: Low   GOALS: Goals reviewed with patient? Yes  SHORT TERM GOALS: Target date: 05/08/2024  Patient will be independent in HEP to improve strength/mobility for better functional independence with ADLs.  Baseline:  Goal status: INITIAL   LONG TERM GOALS: Target date: 06/05/2024  Patient will reduce Neck Disability Index score to <10% to demonstrate minimal disability with ADLs including improved sleeping tolerance, sitting tolerance, etc  for better mobility at home and work.  Baseline: 04/10/24: 8/50 = 16%  Goal status: INITIAL  2.  Patient will report uninterrupted sleep x 8 hours with no  reports of numbness in either hand to demonstrate improvement in neural tension and improvement in quality of sleep. Baseline: 04/10/24: disturbed 3-5 hours  Goal status: INITIAL  3.  Patient will report no greater than 3/10 on NRPS in neck/hands to improve tolerance with sleeping and reduced symptoms with activities.  Baseline: 04/10/24: 6/10 pain  Goal status: INITIAL  4.  Patient will report decrease in frequency in headaches to demonstrate improvement in muscular tension and tolerance in daily and work activities. Baseline: 04/10/24: moderate headaches ~1 week at a time Goal status: INITIAL   PLAN:  PT FREQUENCY: 1-2x/week  PT DURATION: 8 weeks  PLANNED INTERVENTIONS: 97164- PT Re-evaluation, 97750- Physical Performance Testing, 97110-Therapeutic exercises, 97530- Therapeutic activity, 97112- Neuromuscular re-education, 97535- Self Care, [id number]- Manual therapy, [id number]- Electrical stimulation (unattended), [phone]- Electrical stimulation (manual), 20560 (1-2 muscles), 20561 (3+ muscles)- Dry Needling, Patient/Family education, Joint mobilization, Joint manipulation, Spinal manipulation, Spinal mobilization, Cryotherapy, and Moist heat  PLAN FOR NEXT SESSION: ULTT for median nerve primarily, further assessment for carpal tunnel vs cervical radiculopathy, STM work on upper trap/levator/suboccipitals   -ask if she gets numbness in her hands when typing at work   Maryanne Finder, PT, DPT Physical Therapist - [facility] 04/10/2024, 5:05 PM     "

## 2024-04-11 ENCOUNTER — Encounter: Payer: Self-pay | Admitting: Family Medicine

## 2024-04-11 ENCOUNTER — Ambulatory Visit: Admitting: Family Medicine

## 2024-04-11 VITALS — BP 131/73 | HR 88 | Ht 67.0 in | Wt 274.0 lb

## 2024-04-11 DIAGNOSIS — J310 Chronic rhinitis: Secondary | ICD-10-CM

## 2024-04-11 MED ORDER — PANTOPRAZOLE SODIUM 40 MG PO TBEC: 40.0000 mg | DELAYED_RELEASE_TABLET | Freq: Every day | ORAL | 3 refills | Status: AC

## 2024-04-11 MED ORDER — AZELASTINE HCL 0.1 % NA SOLN: 2.0000 | Freq: Two times a day (BID) | NASAL | 12 refills | Status: AC

## 2024-04-11 NOTE — Progress Notes (Signed)
 "  Subjective:    Patient ID: Belinda Martin, female    DOB: 1975/08/11, 49 y.o.   MRN: [id number]  Patient states that she does fine during the day.  However when she wakes up in the morning, she has thick congestion in her upper airways.  She has to cough and hack up mucus that is typically clear although occasionally it has blood.  Sometimes she will throw up clear mucus.  This happens every morning.  Afterwards she is fine.  She denies any rhinorrhea or sneezing or head congestion for the remainder of the day.  When she lays down at night she feels fine but then by the following morning there is thick head congestion in her upper airways.  Being supine in bed seems to trigger this phenomenon.  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   Tendonitis 09/2023   Right Shoulder   Past Surgical History:  Procedure Laterality Date   COLONOSCOPY N/A 07/31/2023   Procedure: COLONOSCOPY;  Surgeon: Jinny Carmine, MD;  Location: [facility] ENDOSCOPY;  Service: Endoscopy;  Laterality: N/A;  Patient gets very nausea and vomiting after procedure   Electrocautery of condyloma accuminata  09/2007   Dr Hazel   NASAL SINUS SURGERY     POLYPECTOMY  07/31/2023   Procedure: POLYPECTOMY, INTESTINE;  Surgeon: Jinny Carmine, MD;  Location: [facility] ENDOSCOPY;  Service: Endoscopy;;    TONSILLECTOMY     TRANSTHORACIC ECHOCARDIOGRAM  01/2009   EF 65-70%, normal valves   TRANSTHORACIC ECHOCARDIOGRAM  05/30/2022   NORMAL ECHO.  EF 60 to 65% no RWMA.  Normal diastolic parameters.  Normal RV size function and pressures.  Normal valves.  Normal RAP.   Current Outpatient Medications on File Prior to Visit  Medication Sig Dispense Refill   albuterol  (VENTOLIN  HFA) 108 (90 Base) MCG/ACT inhaler Inhale 2 puffs into the lungs every 6 (six) hours as needed for wheezing or shortness of breath. 8 g 1   ascorbic acid (VITAMIN C) 500 MG tablet Take 500 mg by mouth daily.     Azelaic Acid  15 % gel After skin is thoroughly washed and patted dry, gently but thoroughly massage a thin film of azelaic acid  cream into the affected area twice daily, in the morning and evening. 50 g 3   Biotin 10 MG TABS Take by mouth daily.     cholecalciferol (VITAMIN D3) 25 MCG (1000 UNIT) tablet Take 1,000 Units by mouth daily.     clotrimazole -betamethasone  (LOTRISONE ) cream Apply externally BID prn sx up to 2 wks 15 g 1   Drospirenone  (SLYND ) 4 MG TABS Take 1 tablet (4 mg total) by mouth daily. 84 tablet 3   fexofenadine (ALLEGRA) 180 MG tablet Take  180 mg by mouth daily.     fluticasone  (FLONASE ) 50 MCG/ACT nasal spray Place 2 sprays into both nostrils daily. 16 g 6   folic acid (FOLVITE) 1 MG tablet Take 3 mg by mouth daily.     Glucosamine 500 MG CAPS Take 1,000 mg by mouth daily.     hydrocortisone  2.5 % cream Apply topically 2 (two) times daily as needed (Rash). To area inner thighs and folds for up to 2 weeks. Avoid applying to face, groin, and axilla. Use as directed. Risk of skin atrophy with long-term use reviewed. 30 g 1   Ibuprofen-Famotidine (DUEXIS) 800-26.6 MG TABS Take 1 tablet by mouth as needed.     mometasone  (ELOCON ) 0.1 % cream Apply 1-2 times daily to hands as needed. Avoid applying to face, groin, and axilla. Use as directed. Long-term use can cause thinning of the skin. 45 g 0    Multiple Vitamins-Calcium (ONE-A-DAY WOMENS FORMULA PO) Take 1 tablet by mouth daily.     mupirocin  ointment (BACTROBAN ) 2 % Apply 1 Application topically 2 (two) times daily. Apply to wound on right ankle and cover until healed 30 g 0   Omega-3 Fatty Acids (FISH OIL) 1000 MG CAPS Take 2 capsules by mouth daily.     tiZANidine  (ZANAFLEX ) 4 MG tablet Take 1 tablet (4 mg total) by mouth every 6 (six) hours as needed for muscle spasms. 30 tablet 0   TRIAMCINOLONE  ACETONIDE, TOP, (TRIANEX ) 0.05 % OINT Apply twice daily to hands for up to 2 weeks then weekends only. Avoid applying to face, groin, and axilla. Use as directed. Risk of skin atrophy with long-term use reviewed. 110 g 1   triamcinolone  cream (KENALOG ) 0.1 % Apply topically to affected redness at right ankle qd/bid as needed 28.4 g 0   vitamin B-12 (CYANOCOBALAMIN) 500 MCG tablet Take 1,000 mcg by mouth daily.     meloxicam  (MOBIC ) 15 MG tablet Take 1 tablet (15 mg total) by mouth daily. (Patient not taking: Reported on 04/10/2024) 30 tablet 3   No current facility-administered medications on file prior to visit.   Allergies  Allergen Reactions   Amoxicillin     REACTION: hives   Social History   Socioeconomic History   Marital status: Married    Spouse name: Redell   Number of children: 0   Years of education: Not on file   Highest education level: Master's degree (e.g., MA, MS, MEng, MEd, MSW, MBA)  Occupational History   Occupation: Paramedic for Mental Health    Comment: [employer]  Tobacco Use   Smoking status: Never   Smokeless tobacco: Never  Vaping Use   Vaping status: Never Used  Substance and Sexual Activity   Alcohol use: Not Currently    Comment: rare   Drug use: No   Sexual activity: Yes    Partners: Male    Birth control/protection: Pill  Other Topics Concern   Not on file  Social History Narrative   Remarried; lives in [place]      She has lost about 90 pounds in the last year-dietary  modification and try to increase exercise level.   Her new husband has encouraged her to increase her exercise => she walks most days.  They go hiking on the weekends.     Social Drivers of Health   Tobacco Use: Low Risk (04/11/2024)   Patient History    Smoking Tobacco Use: Never    Smokeless Tobacco Use: Never  Passive Exposure: Not on file  Financial Resource Strain: Not on file  Food Insecurity: Not on file  Transportation Needs: Not on file  Physical Activity: Not on file  Stress: Not on file  Social Connections: Not on file  Intimate Partner Violence: Not on file  Depression (PHQ2-9): Low Risk (05/10/2023)   Depression (PHQ2-9)    PHQ-2 Score: 4  Alcohol Screen: Not on file  Housing: Not on file  Utilities: Not on file  Health Literacy: Not on file      Review of Systems     Objective:   Physical Exam Vitals reviewed.  Constitutional:      Appearance: She is well-developed.  Eyes:     General: No visual field deficit. Cardiovascular:     Rate and Rhythm: Normal rate and regular rhythm.     Heart sounds: Normal heart sounds.  Pulmonary:     Effort: Pulmonary effort is normal.     Breath sounds: Normal breath sounds.  Neurological:     Cranial Nerves: No cranial nerve deficit, dysarthria or facial asymmetry.     Sensory: No sensory deficit.     Motor: No weakness.     Coordination: Coordination normal.           Assessment & Plan:  Rhinitis, unspecified type Exam is unremarkable today.  I believe the patient likely has rhinitis and sinusitis due to acid reflux at night when she is supine causing inflammation in her upper airways and larynx.  She states that she is constantly having to clear her throat and feels like there is mucus in her throat.  I have asked the patient to continue Flonase  and Allegra.  She has been on this for a long time.  We will add Astelin  2 sprays each nostril twice daily for possible postnasal drip simply from rhinitis.  However  also recommended she elevate the head of the bed 2 inches and add Protonix  40 mg a day.  Reassess in 3 to 4 weeks "

## 2024-04-16 ENCOUNTER — Ambulatory Visit: Admitting: Physical Therapy

## 2024-04-18 ENCOUNTER — Ambulatory Visit: Admitting: Physical Therapy

## 2024-04-23 ENCOUNTER — Ambulatory Visit: Admitting: Physical Therapy

## 2024-04-23 ENCOUNTER — Encounter: Payer: Self-pay | Admitting: Family Medicine

## 2024-04-23 ENCOUNTER — Encounter: Payer: Self-pay | Admitting: Physical Therapy

## 2024-04-23 DIAGNOSIS — R200 Anesthesia of skin: Secondary | ICD-10-CM

## 2024-04-23 DIAGNOSIS — M542 Cervicalgia: Secondary | ICD-10-CM

## 2024-04-23 DIAGNOSIS — R051 Acute cough: Secondary | ICD-10-CM

## 2024-04-23 MED ORDER — HYDROCODONE BIT-HOMATROP MBR 5-1.5 MG/5ML PO SOLN: 5.0000 mL | Freq: Three times a day (TID) | ORAL | 0 refills | Status: AC | PRN

## 2024-04-23 NOTE — Therapy (Unsigned)
 " OUTPATIENT PHYSICAL THERAPY TREATMENT   Patient Name: Belinda Martin MRN: [id number] DOB:12-26-1975, 49 y.o., female Today's Date: 04/23/2024  END OF SESSION:  PT End of Session - 04/23/24 1730     Visit Number 2    Number of Visits 17    Date for Recertification  06/05/24    Authorization Type [place] [employer] PPO reporting period from 04/23/2024    PT Start Time 1732    PT Stop Time 1819    PT Time Calculation (min) 47 min    Activity Tolerance Patient tolerated treatment well;No increased pain    Behavior During Therapy 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   Tendonitis 09/2023   Right Shoulder   Past Surgical History:  Procedure Laterality Date   COLONOSCOPY N/A 07/31/2023   Procedure: COLONOSCOPY;  Surgeon: Jinny Carmine, MD;  Location: [facility] ENDOSCOPY;  Service: Endoscopy;  Laterality: N/A;  Patient gets very nausea and vomiting after procedure   Electrocautery of condyloma accuminata  09/2007   Dr Hazel   NASAL SINUS SURGERY     POLYPECTOMY  07/31/2023   Procedure: POLYPECTOMY, INTESTINE;  Surgeon: Jinny Carmine, MD;  [facility] ENDOSCOPY;  Service: Endoscopy;;   TONSILLECTOMY     TRANSTHORACIC ECHOCARDIOGRAM  01/2009   EF 65-70%, normal valves   TRANSTHORACIC ECHOCARDIOGRAM  05/30/2022   NORMAL ECHO.  EF 60 to 65% no RWMA.   Normal diastolic parameters.  Normal RV size function and pressures.  Normal valves.  Normal RAP.   Patient Active Problem List   Diagnosis Date Noted   Viral URI with cough 01/10/2024   Rabies exposure 12/22/2023   Screening for colon cancer 07/31/2023   Polyp of descending colon 07/31/2023   Blood in stool 05/10/2023   COVID-19 virus infection 04/19/2023   Upper respiratory infection 04/14/2023   DOE (dyspnea on exertion) 04/21/2022   Fatigue 04/21/2022   Rapid palpitations 05/11/2020   MTHFR gene mutation    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   Pseudotumor cerebri    Sleep disorder    Morbid obesity (HCC)    DDD (degenerative disc disease), lumbar    Environmental and seasonal allergies 07/21/2013   Asthma 01/15/2009    PCP: Duanne Butler DASEN, MD  REFERRING PROVIDER: Duanne Butler DASEN, MD  REFERRING DIAG: M50.30 (ICD-10-CM) - DDD (degenerative disc disease), cervical  THERAPY DIAG:  Numbness and tingling in both hands  Neck pain  Rationale for Evaluation and Treatment: Rehabilitation  ONSET DATE: chronic  SUBJECTIVE:  SUBJECTIVE STATEMENT: Patient has had pain on both sides of back of neck, went away after a couple of days. Still has pain at front of neck. She did a spin class recently and is having pain in neck and soreness from leg workout. Patient expressed increased pain in anterior neck. Patient noted to have pseudotumor cerebri (idiopathic intracranial hypertension) in her documented past medical history, but pt unaware of ever being told about this diagnosis. 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 She denies ever being diagnosed with PCOS. She states she has been very flexible in her life. She  states she is experiencing symptoms that have been identified as perimenopause but has not started HRT yet (she has limitations on which kind). She states she had migraines when much younger that were improved with hormone related therapy and they resolved for decades, but are back again more recently.   Hand dominance: Right  PERTINENT HISTORY:  Per MD note 12/28/23, Patient is having diffuse muscle pain in the trapezius, sternocleidomastoid muscles, cervical paraspinal muscles, and both shoulders. She is also complaining of pain in her sternum and is now having anterior neck pain. Start the patient on prednisone  taper pack.  If the pain improved dramatically with throughout the chest and neck area, consider possible cervical radiculopathy as a potential cause of her diffuse neck pain and shoulder pain especially given the numbness and tingling in her arms.  PAIN:  Current pain: 0/10 neck pain    From initial eval: Are you having pain? Yes: NPRS scale: 0/10 current; 6/10 worst Pain location: anterior neck, hands Pain description: discomfort, numbness Aggravating factors: intermittent, laying on one side  Relieving factors: laying on her back   PRECAUTIONS: None  RED FLAGS: None     WEIGHT BEARING RESTRICTIONS: No  FALLS:  Has patient fallen in last 6 months? No  LIVING ENVIRONMENT: Lives with: lives with their family Lives in: House/apartment   OCCUPATION: Mental Health therapist  PLOF: Independent  PATIENT GOALS: to resolve numbness   OBJECTIVE:  Alar ligament test = (-) Sharp purser test = (-)  Beighton Score for Hypermobility:  A: did not assess B: 0 C: 0 D: 0 Total: 0/9  A) With the palm of the hand and forearm resting on a flat surface with the elbow flexed at 90, if the metacarpal-phalangeal joint of the fifth finger can be hyperextended more than 90 with respect to the dorsum of the hand, it is considered positive, scoring 1 point  [for each side that  meets criteria]  (B) With arms outstretched forward but hand pronated, if the thumb can be passively moved to touch the ipsilateral forearm it is considered positive scoring 1 point [for each side that meets criteria].   (C) With the arms outstretched to the side and hand supine, if the elbow extends more than 10, it is considered positive scoring 1 point [for each side that meets criteria].   (D) While standing, with knees locked in genu recurvatum, if the knee extends more than 10, it is considered positive scoring 1 point [for each side that meets criteria].   (E) With knees locked straight and feet together, if the patient can bend forward to place the total palm of both hands flat on the floor just in front of the feet, it is considered positive scoring 1 point.   A positive Beighton score is any score greater than or equal to  5/9 points in adults, 6/9 points in children (before puberty), and 4/9 points in adults over age 6.  Reference retrieved from [email or web address] on 04/24/2024  TREATMENT  Manual therapy: to reduce pain and tissue tension, improve range of motion, neuromodulation, in order to promote improved ability to complete functional activities.  Upper cervical spine tests listed above  Therapeutic exercise: therapeutic exercises that incorporate ONE parameter at one or more areas of the body to centralize symptoms, develop strength and endurance, range of motion, and flexibility required for successful completion of functional activities.  Assessment of Beighton score (see above)  DNF exercise  3 x 30 seconds  Added to HEP   Seated cervical retraction  3 x 10 reps  Pt required multimodal cuing for proper technique and to facilitate improved neuromuscular control, strength, range of motion, and functional ability resulting in improved performance and form.                                                                                                                    PATIENT EDUCATION:  Education details: HEP, POC, goals  Person educated: Patient Education method: Explanation, Demonstration, and Handouts Education comprehension: verbalized understanding and returned demonstration  HOME EXERCISE PROGRAM: Access Code: [email or web address] URL: [email or web address] Date: 04/23/2024 Prepared by: Vernell Mariscal  Exercises - Seated Upper Trapezius Stretch  - 2-3 x daily - 5-7 x weekly - 3-5 reps - 30-60 seconds hold - Seated Levator Scapulae Stretch  - 2-3 x daily - 5-7 x weekly - 3-5 reps - 30-60 seconds hold - Supine Deep Neck Flexor Training - Repetitions  - 1 x daily - 5-7 x weekly - 3 sets - 1 reps - 30 sec hold  ASSESSMENT:  CLINICAL IMPRESSION: Today's session focused on addressing patients neck pain that was 0/10 for today's visit making it more challenging decided what exercises patients would benefit most from. Patient received an alar ligament and sharp pursers test to for safety in the presence of anterior neck pain. Patient expressed no increase in pain during exercises and tolerated all exercises well. Patient's PMH shows pseudotumor cerebri (Idiopathic Intracranial Hypertension) but pt unaware of this, so messaged PCP to confirm she doe snot have it (as this could be a contributor to her neck pian and affect her PT care if it was present). No diagnostic procedure for it was located in chart review by PT during today's visit. She was screened for hypermobility today with Beighton score due to several factors in her PMH and chart suggesting this may be a factor for her. She had Beighton score 0/9 (did not assess pinkie extension), which does not suggest hypermobility. She does show forward head posture with a lot of upper cervical spine extension, which is a common contributor to upper cervical spine pain that can contribute to headaches. Patient would benefit from continued management of limiting  condition by skilled physical therapist to address remaining impairments and functional limitations to work towards stated goals and return to PLOF or maximal functional independence.  OBJECTIVE IMPAIRMENTS: decreased endurance, decreased strength, hypomobility, impaired flexibility, impaired sensation, postural dysfunction, and pain.   ACTIVITY LIMITATIONS: lifting, sleeping, and hygiene/grooming  PARTICIPATION LIMITATIONS: occupation and yard work  PERSONAL FACTORS: Age, Behavior pattern, Fitness, Past/current experiences, Profession, and Time since onset of injury/illness/exacerbation are also affecting patient's functional outcome.   REHAB POTENTIAL: Good  CLINICAL DECISION MAKING: Stable/uncomplicated  EVALUATION COMPLEXITY: Low   GOALS: Goals reviewed with patient? Yes  SHORT TERM GOALS: Target date: 05/08/2024  Patient will be independent in HEP to improve strength/mobility for better functional independence with ADLs.  Baseline:  Goal status: In progress   LONG TERM GOALS: Target date: 06/05/2024  Patient will reduce Neck Disability Index score to <10% to demonstrate minimal disability with ADLs including improved sleeping tolerance, sitting tolerance, etc for better mobility at home and work.  Baseline: 04/10/24: 8/50 = 16%  Goal status: In progress  2.  Patient will report uninterrupted sleep x 8 hours with no reports of numbness in either hand to demonstrate improvement in neural tension and improvement in quality of sleep. Baseline: 04/10/24: disturbed 3-5 hours  Goal status: In progress  3.  Patient will report no greater than 3/10 on NRPS in neck/hands to improve tolerance with sleeping and reduced symptoms with activities.  Baseline: 04/10/24: 6/10 pain  Goal status: In progress  4.  Patient will report decrease in frequency in headaches to demonstrate improvement in muscular tension and tolerance in daily and work activities. Baseline: 04/10/24: moderate headaches ~1  week at a time Goal status: In progress   PLAN:  PT FREQUENCY: 1-2x/week  PT DURATION: 8 weeks  PLANNED INTERVENTIONS: 97164- PT Re-evaluation, 97750- Physical Performance Testing, 97110-Therapeutic exercises, 97530- Therapeutic activity, [id number]- Neuromuscular re-education, 97535- Self Care, [id number]- Manual therapy, [id number]- Electrical stimulation (unattended), [phone]- Electrical stimulation (manual), 20560 (1-2 muscles), 20561 (3+ muscles)- Dry Needling, Patient/Family education, Joint mobilization, Joint manipulation, Spinal manipulation, Spinal mobilization, Cryotherapy, and Moist heat  PLAN FOR NEXT SESSION: Palpation of SCM and anterior neck. Find out more about pt's exercise history and goals to help inform PT treatment choices to keep her neck from being a barrier. Consider suboccipital muscle stretching and upper cervical spine flexion mobilization with or without manual/self STM to decrease this common headache and neck pain referral site. Consider assessing thoracic spine motion with interventions as needed. Consider MDT assessment of neck pain. Consider postural exercises to address neck and shoulder pain. Review HEP. Consider ULTT for median nerve primarily, further assessment for carpal tunnel vs cervical radiculopathy, STM work on upper trap/levator/suboccipitals. Consider dry needling for suboccipitals, especially if ineffective relief from less invasive techniques.   Vernell Mariscal SPT  Student physical therapist under direct supervision of licensed physical therapists during the entirety of the session.   Camie SAUNDERS. Juli, PT, DPT, Cert. MDT, PRA-C 04/24/24, 10:56 AM  [facility] [address] [place], [place] [address] P: [phone] I F: [phone]      "

## 2024-04-24 ENCOUNTER — Ambulatory Visit
Admission: RE | Admit: 2024-04-24 | Discharge: 2024-04-24 | Disposition: A | Source: Ambulatory Visit | Attending: Family Medicine | Admitting: Family Medicine

## 2024-04-24 ENCOUNTER — Ambulatory Visit
Admission: RE | Admit: 2024-04-24 | Discharge: 2024-04-24 | Disposition: A | Discharge status: Home / Self Care | Attending: Family Medicine | Admitting: Family Medicine

## 2024-04-24 ENCOUNTER — Telehealth: Payer: Self-pay

## 2024-04-24 DIAGNOSIS — R051 Acute cough: Secondary | ICD-10-CM | POA: Insufficient documentation

## 2024-04-24 NOTE — Telephone Encounter (Signed)
 Mychart message sent

## 2024-04-24 NOTE — Telephone Encounter (Signed)
 Copied from CRM #[id number]. Topic: Clinical - Request for Lab/Test Order >> Apr 24, 2024  9:13 AM Wess RAMAN wrote: Reason for CRM: Patient would like to know if there are any places in [place] that she could do her chest xray so she doesn't have to go to [facility] #: [phone]

## 2024-04-25 ENCOUNTER — Encounter: Payer: Self-pay | Admitting: Family Medicine

## 2024-04-25 ENCOUNTER — Ambulatory Visit: Admitting: Physical Therapy

## 2024-04-25 DIAGNOSIS — R200 Anesthesia of skin: Secondary | ICD-10-CM

## 2024-04-25 DIAGNOSIS — M542 Cervicalgia: Secondary | ICD-10-CM

## 2024-04-25 NOTE — Therapy (Signed)
 " OUTPATIENT PHYSICAL THERAPY TREATMENT   Patient Name: Belinda Martin MRN: [id number] DOB:[birth year], 49 y.o., female Today's Date: 04/25/2024  END OF SESSION:  PT End of Session - 04/25/24 1735     Visit Number 3    Number of Visits 17    Date for Recertification  06/05/24    Authorization Type [place] [employer] PPO reporting period from 04/23/2024    PT Start Time 1732    PT Stop Time 1820    PT Time Calculation (min) 48 min    Activity Tolerance Patient tolerated treatment well;No increased pain    Behavior During Therapy 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    Irritable bowel syndrome    Obesity    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   Tendonitis 09/2023   Right Shoulder   Past Surgical History:  Procedure Laterality Date   COLONOSCOPY N/A 07/31/2023   Procedure: COLONOSCOPY;  Surgeon: Jinny Carmine, MD;  Location: [facility] ENDOSCOPY;  Service: Endoscopy;  Laterality: N/A;  Patient gets very nausea and vomiting after procedure   Electrocautery of condyloma accuminata  09/2007   Dr Hazel   NASAL SINUS SURGERY     POLYPECTOMY  07/31/2023   Procedure: POLYPECTOMY, INTESTINE;  Surgeon: Jinny Carmine, MD;  Location: [facility] ENDOSCOPY;  Service: Endoscopy;;   TONSILLECTOMY     TRANSTHORACIC ECHOCARDIOGRAM  01/2009   EF 65-70%, normal valves   TRANSTHORACIC ECHOCARDIOGRAM  05/30/2022   NORMAL ECHO.  EF 60 to 65% no RWMA.  Normal diastolic  parameters.  Normal RV size function and pressures.  Normal valves.  Normal RAP.   Patient Active Problem List   Diagnosis Date Noted   Viral URI with cough 01/10/2024   Rabies exposure 12/22/2023   Screening for colon cancer 07/31/2023   Polyp of descending colon 07/31/2023   Blood in stool 05/10/2023   COVID-19 virus infection 04/19/2023   Upper respiratory infection 04/14/2023   DOE (dyspnea on exertion) 04/21/2022   Fatigue 04/21/2022   Rapid palpitations 05/11/2020   MTHFR gene mutation    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   Pseudotumor cerebri    Sleep disorder    Morbid obesity (HCC)    DDD (degenerative disc disease), lumbar    Environmental and seasonal allergies 07/21/2013   Asthma 01/15/2009    PCP: Duanne Butler DASEN, MD  REFERRING PROVIDER: Duanne Butler DASEN, MD  REFERRING DIAG: M50.30 (ICD-10-CM) - DDD (degenerative disc disease), cervical  THERAPY DIAG:  Numbness and tingling in both hands  Neck pain  Rationale for Evaluation and Treatment: Rehabilitation  ONSET DATE: chronic  SUBJECTIVE:  SUBJECTIVE STATEMENT: Patient states no change in pain. Slight soreness after last session. She has not been doing her HEP because she was really tired from day to day activities.   Hand dominance: Right  PERTINENT HISTORY:  Per MD note 12/28/23, Patient is having diffuse muscle pain in the trapezius, sternocleidomastoid muscles, cervical paraspinal muscles, and both shoulders. She is also complaining of pain in her sternum and is now having anterior neck pain. Start the patient on prednisone  taper pack.  If the pain improved dramatically with throughout the chest and neck area, consider possible cervical radiculopathy as a potential cause of her diffuse neck pain and  shoulder pain especially given the numbness and tingling in her arms.  PAIN:  Current pain: 0/10 neck pain    From initial eval on 04/10/24: Are you having pain? Yes: NPRS scale: 0/10 current; 6/10 worst Pain location: anterior neck, hands Pain description: discomfort, numbness Aggravating factors: intermittent, laying on one side  Relieving factors: laying on her back   PRECAUTIONS: None  RED FLAGS: None   OCCUPATION: Mental Health therapist  PLOF: Independent  PATIENT GOALS: to resolve numbness   OBJECTIVE:   PALPATION TTP to posterior neck musculature and B SCM:  Generally more tension R > L TTP with reproduction of R headache with sustained pressure at right suboccipital muscles General tension and soreness in B UT but no radiation down arms or to head.   TRACTION ALLEVIATION Hooklying: feels traction to lumbar spine, no change in symptoms (but low symptoms to start)  Movement Loss (Cervical Spine AROM) Movement Loss Symptoms  Flexion mod Pulling at CT junction and down back  Retraction maj   Extension mod Pain at CT junction  Lateral flexion R nil   Lateral flexion L nil   Rotation R nil OP applied  Rotation L  nil OP applied   Concordant R suboccipital pain with OP rotation R or L (did not record which way it was)  Repeated Movement Testing Pre-Test Symptoms Test Movement Symptom During Symptom After Mechanical Response Key Functional Test   Rep Retraction in sitting 1x10 Uncomfortable no worse no effect    Rep Retraction in Sitting with clinician OP 1x6 Uncomfortable, stiff no worse no effect     TREATMENT  Manual therapy: to reduce pain and tissue tension, improve range of motion, neuromodulation, in order to promote improved ability to complete functional activities.  HOOKLYING  STM of B upper trapezius, SCM, cervical paraspinals, and suboccipital muscles to improve mobility and decrease tension. (Generally tighter on R compared to L and reproduction of  R sided ram's horn headache with sustained pressure to R suboccpitals).   Suboccipital Release to decrease tension and tight muscles at base of skull   Intermittent manual cervical spine traction 3x10 seconds on/off  No change, feels in lumbar spine   Seated repeated motions with clinician OP (see chart under subjective)   Therapeutic exercise: therapeutic exercises that incorporate ONE parameter at one or more areas of the body to centralize symptoms, develop strength and endurance, range of motion, and flexibility required for successful completion of functional activities.  C spine AROM to assess baseline and responses (see above)  Repeated motions testing (see above)  Trial of self -suboccipital release with LAX ball peanut, with provision of peanut to pt for home use (added to HEP)  Sidelying knees to chest thoracic rotation with cervical spine rotation with hand on chest, actively retracting scapula for active stretch of thoracic spine.  1x10 each  side rotating with exhale Added to HEP  Hooklying DNF exercise to improve strength/endurance/motor control of neck flexor muscles and inhibit suboccipitals 3 x 30 seconds   Seated upper cervical spine flexion self mobilization/suboccipital stetch with self overpressure.   2 x 10 reps with 2 second hold Added to HEP  Seated repeated cervical spine retraction with self overpressure 3-4 reps to learn for HEP Added to HEP   Pt required multimodal cuing for proper technique and to facilitate improved neuromuscular control, strength, range of motion, and functional ability resulting in improved performance and form.                                                                                                                   PATIENT EDUCATION:  Education details: HEP, POC, goals  Person educated: Patient Education method: Explanation, Demonstration, and Handouts Education comprehension: verbalized understanding and returned  demonstration  HOME EXERCISE PROGRAM: Access Code: [email or web address] URL: [email or web address] Date: 04/23/2024 Prepared by: Vernell Mariscal  Exercises - Seated Upper Trapezius Stretch  - 2-3 x daily - 5-7 x weekly - 3-5 reps - 30-60 seconds hold - Seated Levator Scapulae Stretch  - 2-3 x daily - 5-7 x weekly - 3-5 reps - 30-60 seconds hold - Supine Deep Neck Flexor Training - Repetitions  - 1 x daily - 5-7 x weekly - 3 sets - 1 reps - 30 sec hold  ASSESSMENT:  CLINICAL IMPRESSION: Patient continues to report relief of hand tingling when laying supine at night but not while in the clinic. She was assessed for response to repeated motions and traction alleviation in hooklying (usually more effective than in sitting) but without large response. However, she demonstrates significant stiffness in cervical spine retraction ROM with limited upper cervical spine flexion and lower cervical spine/upper thoracic spine extension that are likely contributing to her symptoms. Interventions provided and updates to HEP made to start working on these impairments with self STM, self mobilization/stretching, and strengthening of the opposing muscle group incorporated into the program to reinforce changes. Pt does have more tension globally in the muscles of the right side of her neck and pressure to the right suboccipitals reproduced her R sided headache. Unfortunately she did not get reif this visit from sustained pressure there and had mild increase in pain there by end of session. She may be a good candidate for dry needling to that region for more powerful relief with less discomfort than manual release. Patient also continues to seem anxious and would benefit from relaxation techniques, education, and reassurance while completing other PT interventions and addressing physical limitations to help decrease her body's protective drive which is likely amplifying tension and pain. Patient would benefit from  continued management of limiting condition by skilled physical therapist to address remaining impairments and functional limitations to work towards stated goals and return to PLOF or maximal functional independence.     OBJECTIVE IMPAIRMENTS: decreased endurance, decreased strength, hypomobility, impaired flexibility, impaired sensation, postural dysfunction, and pain.   ACTIVITY LIMITATIONS:  lifting, sleeping, and hygiene/grooming  PARTICIPATION LIMITATIONS: occupation and yard work  PERSONAL FACTORS: Age, Behavior pattern, Fitness, Past/current experiences, Profession, and Time since onset of injury/illness/exacerbation are also affecting patient's functional outcome.   REHAB POTENTIAL: Good  CLINICAL DECISION MAKING: Stable/uncomplicated  EVALUATION COMPLEXITY: Low   GOALS: Goals reviewed with patient? Yes  SHORT TERM GOALS: Target date: 05/08/2024  Patient will be independent in HEP to improve strength/mobility for better functional independence with ADLs.  Baseline:  Goal status: In progress   LONG TERM GOALS: Target date: 06/05/2024  Patient will reduce Neck Disability Index score to <10% to demonstrate minimal disability with ADLs including improved sleeping tolerance, sitting tolerance, etc for better mobility at home and work.  Baseline: 04/10/24: 8/50 = 16%  Goal status: In progress  2.  Patient will report uninterrupted sleep x 8 hours with no reports of numbness in either hand to demonstrate improvement in neural tension and improvement in quality of sleep. Baseline: 04/10/24: disturbed 3-5 hours  Goal status: In progress  3.  Patient will report no greater than 3/10 on NRPS in neck/hands to improve tolerance with sleeping and reduced symptoms with activities.  Baseline: 04/10/24: 6/10 pain  Goal status: In progress  4.  Patient will report decrease in frequency in headaches to demonstrate improvement in muscular tension and tolerance in daily and work  activities. Baseline: 04/10/24: moderate headaches ~1 week at a time Goal status: In progress   PLAN:  PT FREQUENCY: 1-2x/week  PT DURATION: 8 weeks  PLANNED INTERVENTIONS: 97164- PT Re-evaluation, 97750- Physical Performance Testing, 97110-Therapeutic exercises, 97530- Therapeutic activity, [id number]- Neuromuscular re-education, 97535- Self Care, [id number]- Manual therapy, [id number]- Electrical stimulation (unattended), [id number]- Electrical stimulation (manual), 20560 (1-2 muscles), 20561 (3+ muscles)- Dry Needling, Patient/Family education, Joint mobilization, Joint manipulation, Spinal manipulation, Spinal mobilization, Cryotherapy, and Moist heat  PLAN FOR NEXT SESSION: consider trial of night splints for wrists. Check phalan's and/or reverse phalan's. Find out more about pt's exercise history and goals to help inform PT treatment choices to keep her neck from being a barrier. Continue with interventions to address restriction in upper cervical flexion and lower cervical extension. Continue exercises for thoracic mobility and add postural strengthening. Continue education and techniques to help decrease anxiety and guarding. Review HEP. Consider ULTT for median nerve primarily, further assessment for carpal tunnel vs cervical radiculopathy. Consider dry needling for suboccipitals, especially if ineffective relief from less invasive techniques.   Vernell Mariscal SPT  Student physical therapist under direct supervision of licensed physical therapists during the entirety of the session.   Camie SAUNDERS. Juli, PT, DPT, Cert. MDT, PRA-C 04/25/24, 6:42 PM  [facility] [address] [place], [place] [address] P: [phone] I F: [phone]      "

## 2024-04-30 ENCOUNTER — Ambulatory Visit: Admitting: Physical Therapy

## 2024-04-30 ENCOUNTER — Ambulatory Visit: Payer: Self-pay | Admitting: Family Medicine

## 2024-04-30 ENCOUNTER — Other Ambulatory Visit: Payer: Self-pay

## 2024-04-30 DIAGNOSIS — R053 Chronic cough: Secondary | ICD-10-CM

## 2024-05-02 ENCOUNTER — Ambulatory Visit: Admitting: Physical Therapy

## 2024-05-02 DIAGNOSIS — R200 Anesthesia of skin: Secondary | ICD-10-CM | POA: Diagnosis not present

## 2024-05-02 DIAGNOSIS — M542 Cervicalgia: Secondary | ICD-10-CM

## 2024-05-03 ENCOUNTER — Encounter: Payer: Self-pay | Admitting: Physical Therapy

## 2024-05-06 ENCOUNTER — Other Ambulatory Visit (HOSPITAL_COMMUNITY): Payer: Self-pay

## 2024-05-06 ENCOUNTER — Ambulatory Visit: Admitting: Physical Therapy

## 2024-05-07 ENCOUNTER — Other Ambulatory Visit (HOSPITAL_COMMUNITY): Payer: Self-pay

## 2024-05-08 ENCOUNTER — Other Ambulatory Visit (HOSPITAL_COMMUNITY): Payer: Self-pay

## 2024-05-08 ENCOUNTER — Encounter: Admitting: Dietician

## 2024-05-08 ENCOUNTER — Ambulatory Visit

## 2024-05-08 DIAGNOSIS — R200 Anesthesia of skin: Secondary | ICD-10-CM

## 2024-05-08 DIAGNOSIS — M542 Cervicalgia: Secondary | ICD-10-CM

## 2024-05-08 MED ORDER — TIZANIDINE HCL 4 MG PO TABS: 4.0000 mg | ORAL_TABLET | Freq: Every evening | ORAL | 1 refills | Status: AC | PRN

## 2024-05-08 MED ORDER — FLUTICASONE PROPIONATE 50 MCG/ACT NA SUSP: 2.0000 | Freq: Every day | NASAL | 6 refills | Status: AC

## 2024-05-08 NOTE — Therapy (Signed)
 " OUTPATIENT PHYSICAL THERAPY TREATMENT   Patient Name: Belinda Martin MRN: [id number] DOB:September 17, 1975, 49 y.o., female Today's Date: 05/08/2024  END OF SESSION:  PT End of Session - 05/08/24 1733     Visit Number 5    Number of Visits 17    Date for Recertification  06/05/24    Authorization Type [place] [employer] PPO reporting period from 04/23/2024    PT Start Time 1733    PT Stop Time 1815    PT Time Calculation (min) 42 min              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    Irritable bowel syndrome    Obesity    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   Tendonitis 09/2023   Right Shoulder   Past Surgical History:  Procedure Laterality Date   COLONOSCOPY N/A 07/31/2023   Procedure: COLONOSCOPY;  Surgeon: Jinny Carmine, MD;  Location: [facility] ENDOSCOPY;  Service: Endoscopy;  Laterality: N/A;  Patient gets very nausea and vomiting after procedure   Electrocautery of condyloma accuminata  09/2007   Dr Hazel   NASAL SINUS SURGERY     POLYPECTOMY  07/31/2023   Procedure: POLYPECTOMY, INTESTINE;  Surgeon: Jinny Carmine, MD;  Location: [facility] ENDOSCOPY;  Service: Endoscopy;;   TONSILLECTOMY     TRANSTHORACIC ECHOCARDIOGRAM  01/2009   EF 65-70%, normal valves   TRANSTHORACIC ECHOCARDIOGRAM  05/30/2022   NORMAL ECHO.  EF 60 to 65% no RWMA.  Normal diastolic parameters.  Normal RV size function and pressures.  Normal valves.  Normal RAP.   Patient Active Problem List   Diagnosis Date Noted    Viral URI with cough 01/10/2024   Rabies exposure 12/22/2023   Screening for colon cancer 07/31/2023   Polyp of descending colon 07/31/2023   Blood in stool 05/10/2023   COVID-19 virus infection 04/19/2023   Upper respiratory infection 04/14/2023   DOE (dyspnea on exertion) 04/21/2022   Fatigue 04/21/2022   Rapid palpitations 05/11/2020   MTHFR gene mutation    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   Pseudotumor cerebri    Sleep disorder    Morbid obesity (HCC)    DDD (degenerative disc disease), lumbar    Environmental and seasonal allergies 07/21/2013   Asthma 01/15/2009    PCP: Duanne Butler DASEN, MD  REFERRING PROVIDER: Duanne Butler DASEN, MD  REFERRING DIAG: M50.30 (ICD-10-CM) - DDD (degenerative disc disease), cervical  THERAPY DIAG:  Numbness and tingling in both hands  Neck pain  Rationale for Evaluation and Treatment: Rehabilitation  ONSET DATE: chronic  SUBJECTIVE:  PERTINENT HISTORY:  Per MD note 12/28/23, Patient is having diffuse muscle pain in the trapezius, sternocleidomastoid muscles, cervical paraspinal muscles, and both shoulders. She is also complaining of pain in her sternum and is now having anterior neck pain. Start the patient on prednisone  taper pack.  If the pain improved dramatically with throughout the chest and neck area, consider possible cervical radiculopathy as a potential cause of her diffuse neck pain and shoulder pain especially given the numbness and tingling in her arms.  Exercise history: bootcamp once a week (at [employer], loves it, does modify if she has pain in her neck but no numbness in her hands, sometimes her right shoulder will get worn out and painful with a lot of lateral raises or front raises). Likes to walk.   SUBJECTIVE  STATEMENT:   Pt reports turning (trunk rotation) to right produces 1-2/10. Pt reports numbness in hands last night but night numbness has not been as frequent. Patient states she has been doing her HEP but does not get to do it as often as she would like   PAIN:  Current pain: 1/10 left sided neck tightness   From initial eval on 04/10/24: Are you having pain? Yes: NPRS scale: 0/10 current; 6/10 worst Pain location: anterior neck, hands Pain description: discomfort, numbness Aggravating factors: intermittent, laying on one side  Relieving factors: laying on her back   PRECAUTIONS: None  RED FLAGS: None   OCCUPATION: Mental Health therapist  PLOF: Independent  PATIENT GOALS: to resolve numbness   OBJECTIVE:   SPECIAL TESTS for CTS Phalen's Test: negative Reverse Phalen's Test: negative   UPPER LIMB NEURODYNAMIC TESTS Upper Limb Tension Test 1 (ULTT1, Median nerve bias, Magee-ULTT1):  R = positive pull at antecubital fossa when approaching elbow extension, relieved by wrist flexion L  = positive at armpit right before moving into elbow extension, relieved by wrist flexion  Upper Limb Tension Test 2B (ULTT2B, Radial nerve bias, Magee-ULTT3): R  = tension at dorsal wrist in fully extended position approx 30 degrees  shoulder abduction, relieved by decreasing shoulder abduction L = tension at antecubital fossa, relieved by wrist extension   Upper Limb Tension Test 3 (ULTT3, Ulnar nerve bias, Magee-ULTT4): R =  tension at axilla and 5th finger near end range, axilla relieved by wrist flexion, 5th finger relieved by elbow extension.  L =  tension at 5th digit near end range, relieved by elbow extension   TREATMENT  Manual therapy  Supine STM to cervical region to increase extensibility of the paraspinals Supine suboccipital release technique to decrease cervicalgia Supine manual traction performed in order to increase joint space in cervical region for pain relief Supine  cervical upglides/downglides, 30 sec bouts Supine UT/Levator stretch, 30 sec bouts to increase tissue extensibility of the cervical region   Therapeutic exercise  Seated Levator Scap stretch, 3 x 30 sec Seated UT stretch, 3 x 30 sec  Seated Lat pull down, 15#, 2x10 Standing wall angels with eccentric lowering, 2 x 10 Standing serratus rolls using blue foam roller, 2 x 10   Pt required multimodal cuing for proper technique and to facilitate improved neuromuscular control, strength, range of motion, and functional ability resulting in improved performance and form.  PATIENT EDUCATION:  Education details: HEP, POC, goals  Person educated: Patient Education method: Explanation, Demonstration, and Handouts Education comprehension: verbalized understanding and returned demonstration  HOME EXERCISE PROGRAM: Access Code: [email or web address] URL: [email or web address] Date: 05/02/2024 Prepared by: Camie Cleverly  Exercises - Cervical Retraction with Overpressure  - 1 sets - 10 reps - 1 second hold - every 2 hours frequency - Seated Upper Trapezius Stretch  - 2-3 x daily - 5-7 x weekly - 3-5 reps - 30-60 seconds hold - Seated Levator Scapulae Stretch  - 2-3 x daily - 5-7 x weekly - 3-5 reps - 30-60 seconds hold - Supine Deep Neck Flexor Training - Repetitions  - 1 x daily - 5-7 x weekly - 3 sets - 1 reps - 30 sec hold - Supine Suboccipital Release with Tennis Balls  - 1 x daily - 3-5 min hold - Sidelying Open Book  - 1-2 x daily - 2 sets - 10 reps - 1 breath hold - Sub-Occipital Cervical Stretch  - 1-2 x daily - 2 sets - 10 reps - 2 seconds hold  HOME EXERCISE PROGRAM [BNARMPE]  Prayer Stretch -  Repeat 10 Repetitions, Hold 2 Seconds, Complete 2 Sets, Perform 1 Times a Day  Thinker -  Repeat 10 Repetitions, Hold 10 Seconds, Complete 1 Set, Perform 1 Times a  Day  ASSESSMENT:  CLINICAL IMPRESSION:  Patient responded well to the manual therapy approach.  Patient noted a reduction in overall symptom response and felt that the pain was diminished and instead started to feel more like soreness rather than pain.  Once this was found patient was given lighter weights and switch sides before continuing on the same side. Attempted scapular strengthening and taught patient gravity assisted UT stretch to perform at home. Will continue to monitor this symptomatic response at future sessions and may be performed at a lighter weight.  Pt will continue to benefit from skilled therapy to address remaining deficits in order to improve overall QoL and return to PLOF.       OBJECTIVE IMPAIRMENTS: decreased endurance, decreased strength, hypomobility, impaired flexibility, impaired sensation, postural dysfunction, and pain.   ACTIVITY LIMITATIONS: lifting, sleeping, and hygiene/grooming  PARTICIPATION LIMITATIONS: occupation and yard work  PERSONAL FACTORS: Age, Behavior pattern, Fitness, Past/current experiences, Profession, and Time since onset of injury/illness/exacerbation are also affecting patient's functional outcome.   REHAB POTENTIAL: Good  CLINICAL DECISION MAKING: Stable/uncomplicated  EVALUATION COMPLEXITY: Low   GOALS: Goals reviewed with patient? Yes  SHORT TERM GOALS: Target date: 05/08/2024  Patient will be independent in HEP to improve strength/mobility for better functional independence with ADLs.  Baseline:  Goal status: In progress   LONG TERM GOALS: Target date: 06/05/2024  Patient will reduce Neck Disability Index score to <10% to demonstrate minimal disability with ADLs including improved sleeping tolerance, sitting tolerance, etc for better mobility at home and work.  Baseline: 04/10/24: 8/50 = 16%  Goal status: In progress  2.  Patient will report uninterrupted sleep x 8 hours with no reports of numbness in either hand to  demonstrate improvement in neural tension and improvement in quality of sleep. Baseline: 04/10/24: disturbed 3-5 hours  Goal status: In progress  3.  Patient will report no greater than 3/10 on NRPS in neck/hands to improve tolerance with sleeping and reduced symptoms with activities.  Baseline: 04/10/24: 6/10 pain  Goal status: In progress  4.  Patient will report decrease in frequency in headaches to demonstrate improvement in muscular tension and tolerance in daily  and work activities. Baseline: 04/10/24: moderate headaches ~1 week at a time Goal status: In progress   PLAN:  PT FREQUENCY: 1-2x/week  PT DURATION: 8 weeks  PLANNED INTERVENTIONS: [id number]- PT Re-evaluation, 97750- Physical Performance Testing, 97110-Therapeutic exercises, 97530- Therapeutic activity, [id number]- Neuromuscular re-education, 97535- Self Care, [id number]- Manual therapy, [id number]- Electrical stimulation (unattended), [phone]- Electrical stimulation (manual), 20560 (1-2 muscles), 20561 (3+ muscles)- Dry Needling, Patient/Family education, Joint mobilization, Joint manipulation, Spinal manipulation, Spinal mobilization, Cryotherapy, and Moist heat  PLAN FOR NEXT SESSION:   check in about trial of night splints for wrists. Continue with interventions to address restriction in upper cervical flexion and lower cervical/thoracic extension. Continue exercises for thoracic mobility and add postural strengthening. Continue education and techniques to help decrease anxiety and guarding. Consider dry needling for suboccipitals, especially if ineffective relief from less invasive techniques.   Laymon GORMAN Perfect, PT, DT Physical Therapist - [facility]   05/08/24, 6:16 PM  "

## 2024-05-13 ENCOUNTER — Ambulatory Visit: Admitting: Physical Therapy

## 2024-05-15 ENCOUNTER — Ambulatory Visit: Admitting: Physical Therapy

## 2024-05-20 ENCOUNTER — Ambulatory Visit: Admitting: Physical Therapy

## 2024-05-20 ENCOUNTER — Encounter: Admitting: Dietician

## 2024-05-22 ENCOUNTER — Ambulatory Visit: Admitting: Physical Therapy

## 2024-05-27 ENCOUNTER — Ambulatory Visit: Admitting: Pulmonary Disease

## 2024-05-28 ENCOUNTER — Ambulatory Visit: Admitting: Physical Therapy

## 2024-05-30 ENCOUNTER — Ambulatory Visit: Admitting: Physical Therapy

## 2024-06-03 ENCOUNTER — Encounter: Admitting: Dietician

## 2024-06-04 ENCOUNTER — Ambulatory Visit: Admitting: Physical Therapy

## 2024-06-06 ENCOUNTER — Ambulatory Visit: Admitting: Physical Therapy

## 2024-06-13 ENCOUNTER — Ambulatory Visit: Admitting: Physical Therapy

## 2024-06-18 ENCOUNTER — Ambulatory Visit: Admitting: Physical Therapy

## 2024-06-20 ENCOUNTER — Ambulatory Visit: Admitting: Physical Therapy

## 2024-06-25 ENCOUNTER — Ambulatory Visit: Admitting: Physical Therapy

## 2024-06-27 ENCOUNTER — Ambulatory Visit: Admitting: Physical Therapy

## 2024-07-02 ENCOUNTER — Ambulatory Visit: Admitting: Physical Therapy

## 2024-12-31 ENCOUNTER — Ambulatory Visit: Admitting: Dermatology
